# Patient Record
Sex: Female | Born: 1935 | Race: White | Hispanic: No | Smoking: Never smoker
Health system: Southern US, Community
[De-identification: ages and names within clinical notes are randomized; demographics above are authoritative.]

## PROBLEM LIST (undated history)

## (undated) DIAGNOSIS — M1990 Unspecified osteoarthritis, unspecified site: Secondary | ICD-10-CM

## (undated) DIAGNOSIS — F0390 Unspecified dementia without behavioral disturbance: Secondary | ICD-10-CM

## (undated) DIAGNOSIS — H3530 Unspecified macular degeneration: Secondary | ICD-10-CM

## (undated) DIAGNOSIS — K469 Unspecified abdominal hernia without obstruction or gangrene: Secondary | ICD-10-CM

## (undated) DIAGNOSIS — K219 Gastro-esophageal reflux disease without esophagitis: Secondary | ICD-10-CM

## (undated) DIAGNOSIS — I10 Essential (primary) hypertension: Secondary | ICD-10-CM

## (undated) DIAGNOSIS — D649 Anemia, unspecified: Secondary | ICD-10-CM

## (undated) DIAGNOSIS — E785 Hyperlipidemia, unspecified: Secondary | ICD-10-CM

## (undated) DIAGNOSIS — I5022 Chronic systolic (congestive) heart failure: Secondary | ICD-10-CM

## (undated) HISTORY — DX: Essential (primary) hypertension: I10

## (undated) HISTORY — PX: HYSTEROTOMY: SHX1776

## (undated) HISTORY — DX: Chronic systolic (congestive) heart failure: I50.22

## (undated) HISTORY — PX: ANGIOPLASTY: SHX39

## (undated) HISTORY — PX: APPENDECTOMY: SHX54

## (undated) HISTORY — DX: Hyperlipidemia, unspecified: E78.5

---

## 2009-09-12 ENCOUNTER — Observation Stay (HOSPITAL_COMMUNITY): Admission: EM | Admit: 2009-09-12 | Discharge: 2009-09-14 | Payer: Self-pay | Admitting: Emergency Medicine

## 2009-11-15 ENCOUNTER — Encounter: Admission: RE | Admit: 2009-11-15 | Discharge: 2009-11-15 | Payer: Self-pay | Admitting: Gastroenterology

## 2010-04-21 ENCOUNTER — Encounter: Payer: Self-pay | Admitting: Gastroenterology

## 2010-06-16 LAB — CBC
HCT: 32.8 % — ABNORMAL LOW (ref 36.0–46.0)
Hemoglobin: 11.2 g/dL — ABNORMAL LOW (ref 12.0–15.0)
Hemoglobin: 11.4 g/dL — ABNORMAL LOW (ref 12.0–15.0)
Hemoglobin: 12.2 g/dL (ref 12.0–15.0)
MCV: 90.6 fL (ref 78.0–100.0)
Platelets: 148 10*3/uL — ABNORMAL LOW (ref 150–400)
RBC: 3.58 MIL/uL — ABNORMAL LOW (ref 3.87–5.11)
RBC: 3.63 MIL/uL — ABNORMAL LOW (ref 3.87–5.11)
RBC: 3.96 MIL/uL (ref 3.87–5.11)
RDW: 13 % (ref 11.5–15.5)
RDW: 13.2 % (ref 11.5–15.5)
WBC: 5 10*3/uL (ref 4.0–10.5)

## 2010-06-16 LAB — CARDIAC PANEL(CRET KIN+CKTOT+MB+TROPI)
CK, MB: 1.6 ng/mL (ref 0.3–4.0)
Relative Index: INVALID (ref 0.0–2.5)
Relative Index: INVALID (ref 0.0–2.5)
Total CK: 41 U/L (ref 7–177)
Troponin I: 0.02 ng/mL (ref 0.00–0.06)
Troponin I: 0.03 ng/mL (ref 0.00–0.06)

## 2010-06-16 LAB — HEMOGLOBIN A1C
Mean Plasma Glucose: 140 mg/dL — ABNORMAL HIGH (ref <117)
Mean Plasma Glucose: 146 mg/dL — ABNORMAL HIGH (ref <117)

## 2010-06-16 LAB — COMPREHENSIVE METABOLIC PANEL
Alkaline Phosphatase: 55 U/L (ref 39–117)
BUN: 14 mg/dL (ref 6–23)
CO2: 25 mEq/L (ref 19–32)
Chloride: 110 mEq/L (ref 96–112)
Glucose, Bld: 94 mg/dL (ref 70–99)
Potassium: 4 mEq/L (ref 3.5–5.1)
Total Bilirubin: 0.5 mg/dL (ref 0.3–1.2)
Total Protein: 5.2 g/dL — ABNORMAL LOW (ref 6.0–8.3)

## 2010-06-16 LAB — POCT I-STAT 3, ART BLOOD GAS (G3+)
TCO2: 26 mmol/L (ref 0–100)
pH, Arterial: 7.381 (ref 7.350–7.400)

## 2010-06-16 LAB — DIFFERENTIAL
Eosinophils Absolute: 0 10*3/uL (ref 0.0–0.7)
Eosinophils Relative: 1 % (ref 0–5)
Lymphocytes Relative: 48 % — ABNORMAL HIGH (ref 12–46)
Monocytes Absolute: 0.3 10*3/uL (ref 0.1–1.0)
Monocytes Relative: 6 % (ref 3–12)
Neutro Abs: 2.5 10*3/uL (ref 1.7–7.7)
Neutrophils Relative %: 45 % (ref 43–77)

## 2010-06-16 LAB — T4, FREE: Free T4: 1.05 ng/dL (ref 0.80–1.80)

## 2010-06-16 LAB — POCT I-STAT, CHEM 8
Calcium, Ion: 1.15 mmol/L (ref 1.12–1.32)
Glucose, Bld: 117 mg/dL — ABNORMAL HIGH (ref 70–99)
HCT: 36 % (ref 36.0–46.0)

## 2010-06-16 LAB — GLUCOSE, CAPILLARY
Glucose-Capillary: 107 mg/dL — ABNORMAL HIGH (ref 70–99)
Glucose-Capillary: 125 mg/dL — ABNORMAL HIGH (ref 70–99)

## 2010-06-16 LAB — BASIC METABOLIC PANEL
Calcium: 8.3 mg/dL — ABNORMAL LOW (ref 8.4–10.5)
GFR calc Af Amer: >60 mL/min (ref >60)
GFR calc non Af Amer: >60 mL/min (ref >60)
Glucose, Bld: 115 mg/dL — ABNORMAL HIGH (ref 70–99)
Sodium: 141 mEq/L (ref 135–145)

## 2010-06-16 LAB — MAGNESIUM: Magnesium: 2 mg/dL (ref 1.5–2.5)

## 2010-06-16 LAB — POCT CARDIAC MARKERS: Myoglobin, poc: 100 ng/mL (ref 12–200)

## 2010-06-16 LAB — URINALYSIS, ROUTINE W REFLEX MICROSCOPIC
Bilirubin Urine: NEGATIVE
Nitrite: NEGATIVE

## 2010-06-16 LAB — CORTISOL: Cortisol, Plasma: 10.3 ug/dL

## 2010-06-16 LAB — CULTURE, BLOOD (ROUTINE X 2): Culture: NO GROWTH

## 2010-06-16 LAB — FERRITIN: Ferritin: 13 ng/mL (ref 10–291)

## 2010-06-16 LAB — BRAIN NATRIURETIC PEPTIDE: Pro B Natriuretic peptide (BNP): 131 pg/mL — ABNORMAL HIGH (ref 0.0–100.0)

## 2010-06-28 ENCOUNTER — Other Ambulatory Visit: Payer: Self-pay | Admitting: Gastroenterology

## 2010-06-28 DIAGNOSIS — K862 Cyst of pancreas: Secondary | ICD-10-CM

## 2010-07-08 ENCOUNTER — Ambulatory Visit
Admission: RE | Admit: 2010-07-08 | Discharge: 2010-07-08 | Disposition: A | Discharge status: Home / Self Care | Payer: Medicare PPO | Source: Ambulatory Visit | Attending: Gastroenterology | Admitting: Gastroenterology

## 2010-07-08 DIAGNOSIS — K862 Cyst of pancreas: Secondary | ICD-10-CM

## 2010-07-08 MED ORDER — GADOBENATE DIMEGLUMINE 529 MG/ML IV SOLN
9.0000 mL | Freq: Once | INTRAVENOUS | Status: AC | PRN
  Administered 2010-07-08: 9 mL via INTRAVENOUS

## 2010-07-12 NOTE — H&P (Signed)
Mary Cline, Mary Cline            ACCOUNT NO.:  [id number]  MEDICAL RECORD NO.:  [id number]          PATIENT TYPE:  OBV  LOCATION:  1846                         FACILITY:  [facility]  PHYSICIAN:  Valetta Close, M.D.   DATE OF BIRTH:  01/11/1923  DATE OF ADMISSION:  09/12/2009 DATE OF DISCHARGE:                             HISTORY & PHYSICAL  CHIEF COMPLAINT:  Weakness in the morning.  HISTORY OF PRESENT ILLNESS:  This is an 77 year old female with a history of mild CHF, hypertension, multiple allergies and anorexia who presents with 4 days in the setting of 1 month of generalized weakness. She has trouble walking in the morning.  She notes her symptoms improve as the day goes on to the point where she is doing fairly well by noon. She notes her gait is a shuffling gait and it is more shuffling than it used to be.  She feels like a weight is literally on her in the morning forcing her down.  She notes she gets lightheaded when she gets up quickly, especially in the morning.  This improves a little bit as the day goes on.  She notes no new medications.  She always weighs between 103 and 108 pounds and has had trouble eating in the past.  Though she has had bouts of diarrhea that have caused low blood pressure in the past she has not had any episodes since February.  She recently moved in with her family and she comes from [place].  She has been here for a month. Again, she moved because she was losing the ability to take care of herself.  She had a bowel movement every 2 days which she says is normal.  She says she always feels cold.  Her family has been checking her blood pressure off and on and her sugars in the morning off and on over the last few days.  Her sugars have been in the 90s but her blood pressure has been as low as the 70s and she did have an episode where she passed out while having a bowel movement recently.  PAST MEDICAL HISTORY: 1. CAD status post two angioplasties  with a history of MI and chronic     left bundle branch block. 2. Congestive heart failure, EF unknown but an echo is pending from     [place]. 3. Hypertension. 4. Status post cholecystectomy. 5. Status post appendectomy. 6. Poor pancreatic function for which she has been on Creon but she     has stopped that. 7. Hyperlipidemia. 8. History of intestinal torsion, being unable to tolerate a     colonoscopy. 9. She tells me that she had hypothyroidism for which she took     medications 20 years ago but she was put on too much of it causing     a bad reaction and she has been off of it ever since.  FAMILY HISTORY:  Notable for diabetes.  REVIEW OF SYSTEMS:  As per the HPI.  SOCIAL HISTORY:  She lives with her family.  No alcohol.  No tobacco. No drugs.  MEDICATIONS: 1. Lisinopril/HCTZ 20/12.5. 2. Protonix 40 mg by mouth  once a day. 3. Tramadol 50 mg by mouth one to two times a day as needed for pain. 4. Gemfibrozil 600 mg by mouth once a day.  She has an extensive allergy list including ACTONEL, VIOXX, ALL STATINS, MACROBID, DEMEROL, GARAMYCIN, IODINE, TETRACYCLINE, PENICILLIN, SULFA and PAXIL.  PHYSICAL EXAMINATION:  Temperature 98.2, heart rate 88, blood pressure 120/69.  On admission it went from 150 to 95 on orthostatics and she got symptomatically lightheaded.  Currently her blood pressure is 152/84 in front of me.  O2 sat 97% on room air. GENERAL: She is weak-appearing and in no apparent distress.  She may be a bit pale. LUNGS: Clear to auscultation bilaterally. CARDIAC: Regular rate and rhythm with a soft murmur. ABDOMEN: No tenderness, rebound or guarding. EXTREMITIES: No edema. NEURO EXAM: Globally weak, otherwise nonfocal.  Gait was not assessed because she is too weak to get up.  LABORATORY DATA:  ABG with a pH of 7.38, pCO2 42, pAO2 66, bicarb 25, O2 sat 92% on room air.  Lactic acid 1.3, BNP 131, hemoglobin 12.2, hematocrit 36 with an MCV of 90, white blood cell  count 5.5 with an ANC of 2.5 and lymphocytes of 2.6, platelets 148.  Sodium 130, potassium 4.4, chloride 104, bicarb 20, BUN 22, creatinine 1, glucose 117.  UA negative.  Portable chest x-ray negative except for an elevated left hemidiaphragm which she says is chronic.  Cardiac enzymes are negative x1.  ASSESSMENT AND PLAN: 1. Weakness.  I am going to admit for observation.  I am going to     hydrate seeing that she is orthostatic.  She is on lisinopril/HCT.     Will also check a TSH, a T4 and a free T3 because it is very     possible that hyper or hypothyroidism is causing all of her     symptoms.  I will also check an a.m. cortisol and if low, give a     cosyntropin stim test.  I will get a physical therapy evaluation,     check an ESR and a CRP to see if this is a myositis though it does     not really fit with that.  It also does not fit with arthritis     because it is diffuse.  I am also going to check a nocturnal O2     saturation just to see if she is very hypoxic at night, though I     doubt that is where the diagnosis lies. 2. Orthostatic hypotension, as above.  Will hydrate but with caution     given her congestive heart failure though I do not know her     ejection fraction and stop the ACE inhibitor HCTZ combo.  I am not     sure why she has not been put on a beta blocker with her congestive     heart failure.  I do not know what kind of congestive heart failure     she has and again will obtain those records from [place]. 3. Congestive heart failure, appears stable.  There is no sign of     volume overload.  No symptoms to support that so will just work up     as above. 4. Anorexia.  I will check a CMP and get a nutrition consult.  She has     been on Megace in the past and I think she may need to be on it in     the future.  She is quite weak and I think she would benefit from     nutritional supplements. 5. Hypoxia, as above. 6. Shuffling gait.  Will get a CT of the head  to rule out NPH and     again get the RPR, B12, RBC, folate, ferritin and have a physical     therapy consult.  Approximate time of this history and physical was 1515 to 1600.     Valetta Close, M.D.   JC/MEDQ  D:  09/12/2009  T:  09/12/2009  Job:  [id number]  Electronically Signed by Valetta Close M.D. on 07/12/2010 11:31:13 AM

## 2011-08-20 ENCOUNTER — Other Ambulatory Visit: Payer: Self-pay | Admitting: Gastroenterology

## 2011-08-20 DIAGNOSIS — K862 Cyst of pancreas: Secondary | ICD-10-CM

## 2011-09-10 ENCOUNTER — Ambulatory Visit
Admission: RE | Admit: 2011-09-10 | Discharge: 2011-09-10 | Disposition: A | Discharge status: Home / Self Care | Payer: Medicare PPO | Source: Ambulatory Visit | Attending: Gastroenterology | Admitting: Gastroenterology

## 2011-09-10 DIAGNOSIS — K862 Cyst of pancreas: Secondary | ICD-10-CM

## 2011-09-10 MED ORDER — GADOBENATE DIMEGLUMINE 529 MG/ML IV SOLN
8.0000 mL | Freq: Once | INTRAVENOUS | Status: AC | PRN
  Administered 2011-09-10: 8 mL via INTRAVENOUS

## 2011-09-16 ENCOUNTER — Encounter (HOSPITAL_COMMUNITY): Payer: Self-pay

## 2011-09-16 ENCOUNTER — Emergency Department (HOSPITAL_COMMUNITY)
Admission: EM | Admit: 2011-09-16 | Discharge: 2011-09-16 | Disposition: A | Discharge status: Home Health | Payer: Medicare PPO | Source: Home / Self Care | Attending: Emergency Medicine | Admitting: Emergency Medicine

## 2011-09-16 DIAGNOSIS — N390 Urinary tract infection, site not specified: Secondary | ICD-10-CM

## 2011-09-16 HISTORY — DX: Unspecified macular degeneration: H35.30

## 2011-09-16 HISTORY — DX: Gastro-esophageal reflux disease without esophagitis: K21.9

## 2011-09-16 HISTORY — DX: Unspecified abdominal hernia without obstruction or gangrene: K46.9

## 2011-09-16 HISTORY — DX: Unspecified osteoarthritis, unspecified site: M19.90

## 2011-09-16 HISTORY — DX: Essential (primary) hypertension: I10

## 2011-09-16 HISTORY — DX: Anemia, unspecified: D64.9

## 2011-09-16 LAB — POCT URINALYSIS DIP (DEVICE)
Glucose, UA: NEGATIVE mg/dL
Ketones, ur: NEGATIVE mg/dL
Protein, ur: 30 mg/dL — AB

## 2011-09-16 LAB — POCT I-STAT, CHEM 8
Calcium, Ion: 1.19 mmol/L (ref 1.12–1.32)
Creatinine, Ser: 0.8 mg/dL (ref 0.50–1.10)
Hemoglobin: 13.6 g/dL (ref 12.0–15.0)
Sodium: 138 mEq/L (ref 135–145)
TCO2: 24 mmol/L (ref 0–100)

## 2011-09-16 MED ORDER — ONDANSETRON 8 MG PO TBDP: 8.0000 mg | ORAL_TABLET | Freq: Three times a day (TID) | ORAL | Status: AC | PRN

## 2011-09-16 NOTE — Discharge Instructions (Signed)

## 2011-09-16 NOTE — ED Notes (Signed)
Pt c/o UTI and abdominal pain.  Pt states she was DX with UTI and started ABX on Thursday. Pt started Cipro, unable to tolerate, changed to Macrobid still experiencing nausea.  Pt was not given any anti-emetic Rx.

## 2011-09-16 NOTE — ED Provider Notes (Signed)
Chief Complaint  Patient presents with  . Urinary Tract Infection    History of Present Illness:   The patient is an 78 year old female who presents with urinary tract infection. One week ago she went to her primary care physician, Dr. Jillyn Hidden for blood pressure check. She had a urinalysis done which apparently showed evidence of urinary tract infection. She was not having much in the way of symptoms except for some urinary frequency. 2 days later, she was started on Cipro. She took 3 doses of this, but it caused burning of the stomach in chest, so she stopped. She called back the next day and the doctor switched her to Macrobid. She took 3 doses of this and cause some nausea. She stopped this as well. She hasn't taken any antibiotics since then and ever since has had dry mouth, generalized weakness, unsteadiness, poor appetite, confusion, low blood sugar episodes, abdominal pain, nausea, vomiting, and constipation. She has not had any fever or chills. Her glucose today was 161. She denies dysuria, urgency, or hematuria.  Review of Systems:  Other than noted above, the patient denies any of the following symptoms: General:  No fevers, chills, sweats, aches, or fatigue. GI:  No abdominal pain, back pain, nausea, vomiting, diarrhea, or constipation. GU:  No dysuria, frequency, urgency, hematuria, or incontinence. GYN:  No discharge, itching, vulvar pain or lesions, pelvic pain, or abnormal vaginal bleeding.  PMFSH:  Past medical history, family history, social history, meds, and allergies were reviewed.  Physical Exam:   Vital signs:  BP 144/77  Pulse 108  Temp 98.7 F (37.1 C) (Oral)  Resp 22  SpO2 97% Gen:  Alert, oriented, in no distress. Lungs:  Clear to auscultation, no wheezes, rales or rhonchi. Heart:  Regular rhythm, no gallop or murmer. Abdomen:  Flat and soft. There was slight suprapubic pain to palpation.  No guarding, or rebound.  No hepato-splenomegaly or mass.  Bowel sounds were  normally active.  No hernia. Back:  No CVA tenderness.  Skin:  Clear, warm and dry.  Labs:   Results for orders placed during the hospital encounter of 09/16/11  POCT I-STAT, CHEM 8      Component Value Range   Sodium 138  135 - 145 mEq/L   Potassium 4.1  3.5 - 5.1 mEq/L   Chloride 100  96 - 112 mEq/L   BUN 19  6 - 23 mg/dL   Creatinine, Ser 0.86  0.50 - 1.10 mg/dL   Glucose, Bld 578 (*) 70 - 99 mg/dL   Calcium, Ion 4.69  6.29 - 1.32 mmol/L   TCO2 24  0 - 100 mmol/L   Hemoglobin 13.6  12.0 - 15.0 g/dL   HCT 52.8  41.3 - 24.4 %  POCT URINALYSIS DIP (DEVICE)      Component Value Range   Glucose, UA NEGATIVE  NEGATIVE mg/dL   Bilirubin Urine NEGATIVE  NEGATIVE   Ketones, ur NEGATIVE  NEGATIVE mg/dL   Specific Gravity, Urine 1.010  1.005 - 1.030   Hgb urine dipstick LARGE (*) NEGATIVE   pH 6.5  5.0 - 8.0   Protein, ur 30 (*) NEGATIVE mg/dL   Urobilinogen, UA 0.2  0.0 - 1.0 mg/dL   Nitrite NEGATIVE  NEGATIVE   Leukocytes, UA TRACE (*) NEGATIVE     Assessment: The encounter diagnosis was UTI (lower urinary tract infection).  She appears to have a urinary tract infection but is having difficulty tolerating antibiotics. She cannot take sulfa or penicillin this.  I would like to try a half a dose of the Cipro twice daily along with some Zofran. She was encouraged to try to get as much clear liquids if she can't eat only light foods such as bananas, rice, applesauce, and toast. She is to followup with Dr. Jillyn Hidden next week. I told her daughter she should develop any fever, chills, or Xylocaine medications or liquids on her stomach to go directly to the hospital.  Plan:   1.  The following meds were prescribed:   New Prescriptions   ONDANSETRON (ZOFRAN ODT) 8 MG DISINTEGRATING TABLET    Take 1 tablet (8 mg total) by mouth every 8 (eight) hours as needed for nausea.   ONDANSETRON (ZOFRAN ODT) 8 MG DISINTEGRATING TABLET    Take 1 tablet (8 mg total) by mouth every 8 (eight) hours as needed for  nausea.   2.  The patient was instructed in symptomatic care and handouts were given. 3.  The patient was told to return if becoming worse in any way, if no better in 3 or 4 days, and given some red flag symptoms that would indicate earlier return. 4.  The patient was told to avoid intercourse for 10 days, get extra fluids, and return for a follow up with her primary care doctor at the completion of treatment for a repeat UA and culture.     Reuben Likes, MD 09/16/11 2012

## 2011-09-18 LAB — URINE CULTURE: Special Requests: NORMAL

## 2012-06-14 ENCOUNTER — Other Ambulatory Visit (HOSPITAL_COMMUNITY): Payer: Self-pay | Admitting: Gastroenterology

## 2012-06-14 DIAGNOSIS — R1310 Dysphagia, unspecified: Secondary | ICD-10-CM

## 2012-06-21 ENCOUNTER — Ambulatory Visit (HOSPITAL_COMMUNITY)
Admission: RE | Admit: 2012-06-21 | Discharge: 2012-06-21 | Disposition: A | Discharge status: Home / Self Care | Payer: Medicare PPO | Source: Ambulatory Visit | Attending: Gastroenterology | Admitting: Gastroenterology

## 2012-06-21 ENCOUNTER — Ambulatory Visit (HOSPITAL_COMMUNITY): Payer: Medicare PPO

## 2012-06-21 DIAGNOSIS — R1310 Dysphagia, unspecified: Secondary | ICD-10-CM

## 2012-06-21 DIAGNOSIS — K224 Dyskinesia of esophagus: Secondary | ICD-10-CM | POA: Insufficient documentation

## 2012-06-21 DIAGNOSIS — I10 Essential (primary) hypertension: Secondary | ICD-10-CM | POA: Insufficient documentation

## 2012-06-21 DIAGNOSIS — K219 Gastro-esophageal reflux disease without esophagitis: Secondary | ICD-10-CM | POA: Insufficient documentation

## 2012-06-21 DIAGNOSIS — E119 Type 2 diabetes mellitus without complications: Secondary | ICD-10-CM | POA: Insufficient documentation

## 2012-06-21 NOTE — Procedures (Signed)
Objective Swallowing Evaluation: Modified Barium Swallowing Study  Patient Details  Name: Mary Cline MRN: [id number] Date of Birth: Jan 06, 1923  Today's Date: 06/21/2012 Time: 4098-1191 SLP Time Calculation (min): 35 min  Past Medical History:  Past Medical History  Diagnosis Date  . Hypertension   . GERD (gastroesophageal reflux disease)   . Diabetes mellitus   . Arthritis   . Macular degeneration disease   . Anemia   . Hernia    Past Surgical History:  Past Surgical History  Procedure Laterality Date  . Angioplasty     HPI:  79 yr old seen for outpatient MBS accompanied by her son-in-law with complaints of coughing while eating/drinking during meals, globus feeling in pharynx and vocal disturbances (low vocal intensity, hoarse).  PMH:  hiatal hernia (takes medicaiton), anemia.     Assessment / Plan / Recommendation Clinical Impression  Dysphagia Diagnosis: Suspected primary esophageal dysphagia;Mild cervical esophageal phase dysphagia Clinical impression: Pt. exhibited mild cervical esophageal dysphagia and suspected primary esophageal dysphagia.  Pharyngeal phase was grossly within functional limits.  Slow clearance of barium through cervical esophagus with what appeared to be bony growths (osteophytes?) in lower cervical esophagus with decreased pressue to transit through proximal esophagus.  Esophagus briefly scanned revealing what appeared to be esophageal residue and slow transit to LES.  One episode of laryngeal penetration with thin barium that briefly entered laryngeal vestibule and ejected out during the swallow likely resulting from inadequate esophageal pressure.  A barium esophagram was performed with radiologist following MBS which revealed nonspecific esophageal motility disorder with severe tertiary.  Pt.'s vocal hoarseness may be result of LPR (laryngopharyngeal reflux?).  She very concerned with current dysphonia as singing is an integral part of her life.  SLP  recommended she see an ENT of problems persis.  SLP reviewed esophageal precautions.     Treatment Recommendation  No treatment recommended at this time    Diet Recommendation Regular;Thin liquid   Liquid Administration via: Cup;Straw Medication Administration: Whole meds with liquid Supervision: Patient able to self feed Compensations: Slow rate;Small sips/bites Postural Changes and/or Swallow Maneuvers: Seated upright 90 degrees;Upright 30-60 min after meal    Other  Recommendations Recommended Consults: Consider ENT evaluation Oral Care Recommendations: Oral care BID   Follow Up Recommendations  None    Frequency and Duration        Pertinent Vitals/Pain none        Reason for Referral Objectively evaluate swallowing function   Oral Phase Oral Preparation/Oral Phase Oral Phase: WFL   Pharyngeal Phase Pharyngeal Phase Pharyngeal Phase: Impaired Pharyngeal - Thin Pharyngeal - Thin Cup: Penetration/Aspiration during swallow;Reduced airway/laryngeal closure;Pharyngeal residue - valleculae;Pharyngeal residue - pyriform sinuses (trace residue only) Penetration/Aspiration details (thin cup): Material enters airway, remains ABOVE vocal cords then ejected out  Cervical Esophageal Phase        Cervical Esophageal Phase Cervical Esophageal Phase: Impaired Cervical Esophageal Phase - Comment Cervical Esophageal Comment:  (slow transit through cervical esophagus with min stasis)         Breck Coons Lonell Face.Ed [employer] [phone]  06/21/2012

## 2012-11-05 ENCOUNTER — Encounter: Payer: Self-pay | Admitting: Neurology

## 2012-11-08 ENCOUNTER — Encounter: Payer: Self-pay | Admitting: Neurology

## 2012-11-08 ENCOUNTER — Ambulatory Visit (INDEPENDENT_AMBULATORY_CARE_PROVIDER_SITE_OTHER): Payer: Medicare PPO | Admitting: Neurology

## 2012-11-08 VITALS — BP 151/89 | HR 92 | Ht 61.5 in | Wt 100.2 lb

## 2012-11-08 DIAGNOSIS — H539 Unspecified visual disturbance: Secondary | ICD-10-CM | POA: Insufficient documentation

## 2012-11-08 DIAGNOSIS — R519 Headache, unspecified: Secondary | ICD-10-CM | POA: Insufficient documentation

## 2012-11-08 DIAGNOSIS — R51 Headache: Secondary | ICD-10-CM | POA: Insufficient documentation

## 2012-11-08 DIAGNOSIS — H3400 Transient retinal artery occlusion, unspecified eye: Secondary | ICD-10-CM

## 2012-11-08 DIAGNOSIS — G453 Amaurosis fugax: Secondary | ICD-10-CM

## 2012-11-08 NOTE — Patient Instructions (Addendum)
Overall you are doing fairly well but I do want to suggest a few things today:   Remember to drink plenty of fluid, eat healthy meals and do not skip any meals. Try to eat protein with a every meal and eat a healthy snack such as fruit or nuts in between meals. Try to keep a regular sleep-wake schedule and try to exercise daily, particularly in the form of walking, 20-30 minutes a day, if you can.   Your symptoms are concerning for a few different diagnosis that I would like to work up.   One possibility is a condition called Giant Cell arteritis. In order to check for this I am ordering a blood test called a ESR (sedimentation rate).  To work up whether this may have been a stroke or TIA (transient ischemic attack) I would like to order a carotid ultrasound  I suggest you follow up with your eye doctor to rule conditions such as glaucoma or macular degeneration   I would like to see you back in 2 to 3 months, sooner if we need to. Please call us with any interim questions, concerns, problems, updates or refill requests.   Please also call us for any test results so we can go over those with you on the phone.  My clinical assistant and will answer any of your questions and relay your messages to me and also relay most of my messages to you.   Our phone number is [phone]. We also have an after hours call service for urgent matters and there is a physician on-call for urgent questions. For any emergencies you know to call 911 or go to the nearest emergency room

## 2012-11-08 NOTE — Progress Notes (Signed)
[facility]  Provider:  Dr Hosie Poisson Referring Provider: Cain Saupe, MD Primary Care Physician:  Cain Saupe, MD  CC:  Dizziness and visual changes  HPI:  Mary Cline is a 79 y.o. female here as a referral from Dr. Jillyn Hidden for evaluation of headache and visual changes.  Mary Cline is a pleasant 79 year old woman who presents for initial evaluation of headache and right eye visual changes. She notes acute onset of sharp retro-orbital right eye pain, lasting around 10-20 minutes. They came on quickly and was described as a blurring haziness of her vision, she denies any current or sheet coming down. More of a fogginess. With this episode she also noted pain in the right temporal region. She has some tenderness to palpation of that region. With these symptoms denies any ptosis, weakness of extremities weakness of face any sensory changes of face or body. Improves slowly with time. She denies any pain  or  difficulty chewing. no other symptoms at that time. She has noted increased eye fatigue since this event, though no further episodes of vision change. Has not seen an eye doctor since around Jan 2014. Has history of detached retina in right eye and macular degeneration in L eye for which she receives laser treatment.   Very sharp pain around R eye, lasted 15 to 20 minutes, no ptosis or drooping noted, wasn't clear, was not a curtain coming down, whole side of R side of head hurt, very tender right side. Came on quickly. Retroorbintal pain, next morning noted erythema in periorbital area. No prior hx. No other symptoms. Got slowly better over time. No pain/difficulty chewing.  Has hx of DM, HTN, hyperlipidemia. Has had CAD in the past, no prior stroke history.   Review of Systems: Out of a complete 14 system review, the patient complains of only the following symptoms, and all other reviewed systems are negative. + blurred vision, eye pain, anemia, hearing loss, trouble  swallowing, constipation, aching muscles, memory loss   History   Social History  . Marital Status: Widowed    Spouse Name: N/A    Number of Children: N/A  . Years of Education: N/A   Occupational History  . Not on file.   Social History Main Topics  . Smoking status: Never Smoker   . Smokeless tobacco: Not on file  . Alcohol Use: No  . Drug Use: No  . Sexually Active:    Other Topics Concern  . Not on file   Social History Narrative  . No narrative on file    No family history on file.  Past Medical History  Diagnosis Date  . Hypertension   . GERD (gastroesophageal reflux disease)   . Diabetes mellitus   . Arthritis   . Macular degeneration disease   . Anemia   . Hernia     Past Surgical History  Procedure Laterality Date  . Angioplasty      Current Outpatient Prescriptions  Medication Sig Dispense Refill  . Cyanocobalamin (VITAMIN B 12 PO) Place 500 mcg under the tongue every other day.      . Diclofenac Sodium (PENNSAID) 1.5 % SOLN Place onto the skin.      Marland Kitchen Fluconazole (DIFLUCAN PO) Take 100 mg by mouth daily.      Marland Kitchen lisinopril (PRINIVIL,ZESTRIL) 5 MG tablet Take 5 mg by mouth daily.      . metFORMIN (GLUCOPHAGE) 500 MG tablet Take 250 mg by mouth daily with breakfast.      .  Nystatin 1000000 UNITS CAPS Apply 100,000 Units topically. Apply to affected area bid      . omeprazole (PRILOSEC) 40 MG capsule Take 40 mg by mouth daily.      . traMADol (ULTRAM) 50 MG tablet Take 50 mg by mouth every 6 (six) hours as needed.       No current facility-administered medications for this visit.    Allergies as of 11/08/2012 - Review Complete 06/21/2012  Allergen Reaction Noted  . Iodine Anaphylaxis 09/16/2011  . Acth (corticotropin) Other (See Comments) 09/16/2011  . Codeine Other (See Comments) 09/16/2011  . Demerol (meperidine) Other (See Comments) 09/16/2011  . Sulfa antibiotics Swelling 09/16/2011  . Vioxx (rofecoxib)  09/16/2011  . Garamycin  (gentamicin sulfate) Rash 09/16/2011    Vitals: There were no vitals taken for this visit. Last Weight:  Wt Readings from Last 1 Encounters:  No data found for Wt   Last Height:   Ht Readings from Last 1 Encounters:  No data found for Ht     Physical exam: Exam: Gen: NAD, conversant Eyes: anicteric sclerae, moist conjunctivae HENT: Atraumatic, tenderness to palpation R temporal region Neck: Trachea midline; supple,  Lungs: CTA, no wheezing, rales, rhonic                          CV: RRR, no MRG Abdomen: Soft, non-tender;  Extremities: No peripheral edema  Skin: Normal temperature, no rash,  Psych: Appropriate affect, pleasant  Neuro: Mary: AA&Ox3, appropriately interactive, normal affect   Speech: fluent w/o paraphasic error  Memory: good recent and remote recall  CN: Mild miosis right pupil, difficult to visualize fundus bilat, EOMI no nystagmus no pain with eye movements, VFF to FC bilat, no ptosis, sensation intact to LT V1-V3 bilat, face symmetric, no weakness, hearing grossly intact, palate elevates symmetrically, shoulder shrug 5/5 bilat,  tongue protrudes midline, no fasiculations noted.  Motor: normal bulk and tone, noted decreased ROM bilateral shoulder (she notes is chronic problem) Strength: 5/5  In all extremities  Coord: rapid alternating and point-to-point (FNF, HTS) movements intact.  Reflexes: symmetrical, bilat downgoing toes  Sens: LT intact in all extremities  Gait: posture, stance, stride and arm-swing normal. Tandem gait intact. Able to walk on heels and toes. Romberg absent.   Assessment:  After physical and neurologic examination, review of laboratory studies, imaging, neurophysiology testing and pre-existing records, assessment will be reviewed on the problem list.  Plan:  Treatment plan and additional workup will be reviewed under Problem List.  Mary Cline is a pleasant 79y/o woman who presents for initial evaluation of transient eye  pain and headache located in R temporal region. Based on clinical history and physical exam findings the differential includes Giant Cell arteritis vs TIA/Amaurosis Fugax vs possible eye related problem such as glaucoma  1) Headache 2) visual changes  -will order ESR to check for GCA, if positive will consider temporal artery biopsy and/or starting steroids -will check carotid ultrasound. Will hold off starting anti-platelet therapy pending results as patient reports prior adverse effect with use of ASA -recommended to patient she follow up with eye doctor for formal eye exam   -follow up in 2 to 3 months

## 2012-11-09 ENCOUNTER — Telehealth: Payer: Self-pay

## 2012-11-09 LAB — SEDIMENTATION RATE: Sed Rate: 7 mm/hr (ref 0–40)

## 2012-11-09 NOTE — Telephone Encounter (Signed)
Message copied by [facility] on Tue Nov 09, 2012  1:37 PM ------      Message from: Ramond Marrow      Created: Tue Nov 09, 2012  1:09 PM       Please let Ms. Rotter know the ESR was normal. Thanks. ------

## 2012-11-09 NOTE — Telephone Encounter (Signed)
I called and spoke with patient's daughter, Mary Cline. I let her know the ESR was normal. She asked if that was the test for arthritis. I explained that this test looks for inflammatory diseases in the body that can include rheumatoid arthritis, lupus and the like. She thanked me for the information.

## 2012-11-10 ENCOUNTER — Encounter: Payer: Self-pay | Admitting: Neurology

## 2012-11-10 DIAGNOSIS — M316 Other giant cell arteritis: Secondary | ICD-10-CM | POA: Insufficient documentation

## 2012-11-10 DIAGNOSIS — H349 Unspecified retinal vascular occlusion: Secondary | ICD-10-CM | POA: Insufficient documentation

## 2012-11-23 ENCOUNTER — Ambulatory Visit (INDEPENDENT_AMBULATORY_CARE_PROVIDER_SITE_OTHER): Payer: Medicare PPO

## 2012-11-23 DIAGNOSIS — G453 Amaurosis fugax: Secondary | ICD-10-CM

## 2012-11-23 DIAGNOSIS — H5310 Unspecified subjective visual disturbances: Secondary | ICD-10-CM

## 2012-11-23 DIAGNOSIS — R51 Headache: Secondary | ICD-10-CM

## 2013-02-28 ENCOUNTER — Ambulatory Visit: Payer: Medicare PPO | Admitting: Podiatry

## 2013-10-24 ENCOUNTER — Other Ambulatory Visit: Payer: Self-pay | Admitting: Gastroenterology

## 2013-10-24 DIAGNOSIS — R1310 Dysphagia, unspecified: Secondary | ICD-10-CM

## 2013-10-24 DIAGNOSIS — R1112 Projectile vomiting: Secondary | ICD-10-CM

## 2013-10-31 ENCOUNTER — Ambulatory Visit
Admission: RE | Admit: 2013-10-31 | Discharge: 2013-10-31 | Disposition: A | Discharge status: Home / Self Care | Payer: Medicare PPO | Source: Ambulatory Visit | Attending: Gastroenterology | Admitting: Gastroenterology

## 2013-10-31 DIAGNOSIS — R1112 Projectile vomiting: Secondary | ICD-10-CM

## 2013-10-31 DIAGNOSIS — R1310 Dysphagia, unspecified: Secondary | ICD-10-CM

## 2014-02-13 ENCOUNTER — Ambulatory Visit (INDEPENDENT_AMBULATORY_CARE_PROVIDER_SITE_OTHER): Payer: Medicare PPO | Admitting: Podiatry

## 2014-02-13 DIAGNOSIS — M79673 Pain in unspecified foot: Secondary | ICD-10-CM

## 2014-02-13 DIAGNOSIS — B351 Tinea unguium: Secondary | ICD-10-CM

## 2014-02-14 NOTE — Progress Notes (Signed)
Subjective:     Patient ID: Mary Cline, female   DOB: 03/09/1923, 80 y.o.   MRN: [id number]  HPIpatient presents with nail disease 1-5 both feet that are painful and she cannot cut herself   Review of Systems     Objective:   Physical Exam Neurovascular status unchanged with thick yellow brittle nailbeds 1-5 both feet that are painful    Assessment:     Mycotic nail infection with pain 1-5 both feet    Plan:     Debride painful nailbeds 1-5 both feet with no iatrogenic bleeding noted

## 2014-09-04 ENCOUNTER — Emergency Department (HOSPITAL_COMMUNITY): Payer: Medicare PPO

## 2014-09-04 ENCOUNTER — Emergency Department (HOSPITAL_COMMUNITY)
Admission: EM | Admit: 2014-09-04 | Discharge: 2014-09-04 | Disposition: A | Discharge status: Home / Self Care | Payer: Medicare PPO | Attending: Emergency Medicine | Admitting: Emergency Medicine

## 2014-09-04 ENCOUNTER — Encounter (HOSPITAL_COMMUNITY): Payer: Self-pay | Admitting: Emergency Medicine

## 2014-09-04 DIAGNOSIS — K219 Gastro-esophageal reflux disease without esophagitis: Secondary | ICD-10-CM | POA: Diagnosis not present

## 2014-09-04 DIAGNOSIS — S79911A Unspecified injury of right hip, initial encounter: Secondary | ICD-10-CM | POA: Diagnosis not present

## 2014-09-04 DIAGNOSIS — Z79899 Other long term (current) drug therapy: Secondary | ICD-10-CM | POA: Diagnosis not present

## 2014-09-04 DIAGNOSIS — S299XXA Unspecified injury of thorax, initial encounter: Secondary | ICD-10-CM | POA: Diagnosis present

## 2014-09-04 DIAGNOSIS — Y998 Other external cause status: Secondary | ICD-10-CM | POA: Diagnosis not present

## 2014-09-04 DIAGNOSIS — W1839XA Other fall on same level, initial encounter: Secondary | ICD-10-CM | POA: Diagnosis not present

## 2014-09-04 DIAGNOSIS — E119 Type 2 diabetes mellitus without complications: Secondary | ICD-10-CM | POA: Insufficient documentation

## 2014-09-04 DIAGNOSIS — Z880 Allergy status to penicillin: Secondary | ICD-10-CM | POA: Insufficient documentation

## 2014-09-04 DIAGNOSIS — Z8739 Personal history of other diseases of the musculoskeletal system and connective tissue: Secondary | ICD-10-CM | POA: Insufficient documentation

## 2014-09-04 DIAGNOSIS — Y9289 Other specified places as the place of occurrence of the external cause: Secondary | ICD-10-CM | POA: Insufficient documentation

## 2014-09-04 DIAGNOSIS — Z8669 Personal history of other diseases of the nervous system and sense organs: Secondary | ICD-10-CM | POA: Insufficient documentation

## 2014-09-04 DIAGNOSIS — W19XXXA Unspecified fall, initial encounter: Secondary | ICD-10-CM

## 2014-09-04 DIAGNOSIS — I10 Essential (primary) hypertension: Secondary | ICD-10-CM | POA: Insufficient documentation

## 2014-09-04 DIAGNOSIS — S2242XA Multiple fractures of ribs, left side, initial encounter for closed fracture: Secondary | ICD-10-CM | POA: Insufficient documentation

## 2014-09-04 DIAGNOSIS — Y9389 Activity, other specified: Secondary | ICD-10-CM | POA: Insufficient documentation

## 2014-09-04 DIAGNOSIS — Z9861 Coronary angioplasty status: Secondary | ICD-10-CM | POA: Insufficient documentation

## 2014-09-04 DIAGNOSIS — D649 Anemia, unspecified: Secondary | ICD-10-CM | POA: Insufficient documentation

## 2014-09-04 MED ORDER — FENTANYL 12 MCG/HR TD PT72
12.5000 ug | MEDICATED_PATCH | TRANSDERMAL | Status: DC
  Administered 2014-09-04: 12.5 ug via TRANSDERMAL
  Filled 2014-09-04: qty 1

## 2014-09-04 MED ORDER — TRAMADOL HCL 50 MG PO TABS: 50.0000 mg | ORAL_TABLET | Freq: Three times a day (TID) | ORAL | Status: AC | PRN

## 2014-09-04 MED ORDER — FENTANYL CITRATE (PF) 100 MCG/2ML IJ SOLN
25.0000 ug | Freq: Once | INTRAMUSCULAR | Status: AC
  Administered 2014-09-04: 25 ug via INTRAMUSCULAR
  Filled 2014-09-04: qty 2

## 2014-09-04 NOTE — Discharge Instructions (Signed)
As discussed, with your newly broken ribs, it is important that you monitor your condition carefully, and do not hesitate to return here for concerning changes in your condition.  If he develops new, or concerning changes, return here immediately.  Otherwise, please sure to follow-up with her primary care physician.  The next 3 days, please use the provided incentive spirometer every 4 hours.  After your pain medication patch has expired, in 3 days, please use the prescribed medication for pain relief.

## 2014-09-04 NOTE — ED Notes (Signed)
Pt c/o left sided rib pain, worse on inspiration onset last night after she tripped and fell. Pt denies LOC, head injury, or use of anticoagulants.

## 2014-09-04 NOTE — ED Provider Notes (Signed)
CSN: [id number]     Arrival date & time 09/04/14  1801 History   First MD Initiated Contact with Patient 09/04/14 1957     Chief Complaint  Patient presents with  . Fall  . Rib Pain     HPI  Patient presents after a fall. Patient mechanical fall the hours prior to ED arrival. She slipped, fell onto her left side, falling onto a pile of wood. Since that time she has had severe pain focally in the left axilla. Pain is nonradiating, worse with activity and inspiration. There is mild associated dyspnea. No lightheadedness, head trauma, syncope. Patient has multiple superficial abrasions, but no other particularly tender area.   Past Medical History  Diagnosis Date  . Hypertension   . GERD (gastroesophageal reflux disease)   . Diabetes mellitus   . Arthritis   . Macular degeneration disease   . Anemia   . Hernia    Past Surgical History  Procedure Laterality Date  . Angioplasty    . Hysterotomy    . Appendectomy     Family History  Problem Relation Age of Onset  . Diabetes     History  Substance Use Topics  . Smoking status: Never Smoker   . Smokeless tobacco: Never Used  . Alcohol Use: No   OB History    No data available     Review of Systems  Constitutional:       Per HPI, otherwise negative  HENT:       Per HPI, otherwise negative  Respiratory:       Per HPI, otherwise negative  Cardiovascular:       Per HPI, otherwise negative  Gastrointestinal: Negative for vomiting.  Endocrine:       Negative aside from HPI  Genitourinary:       Neg aside from HPI   Musculoskeletal:       Per HPI, otherwise negative  Skin: Negative.   Neurological: Negative for syncope.      Allergies  Fish allergy; Iodine; Acth; Atorvastatin; Codeine; Demerol; Lactose intolerance (gi); Other; Penicillins; Sulfa antibiotics; Vioxx; and Garamycin  Home Medications   Prior to Admission medications   Medication Sig Start Date End Date Taking? Authorizing Provider    cholecalciferol (VITAMIN D) 1000 UNITS tablet Take 1,000 Units by mouth daily.   Yes Historical Provider, MD  Cyanocobalamin (VITAMIN B 12 PO) Place 500 mcg under the tongue every other day.   Yes Historical Provider, MD  glycerin adult 2 G SUPP Place 1 suppository rectally once as needed for moderate constipation.   Yes Historical Provider, MD  lisinopril (PRINIVIL,ZESTRIL) 5 MG tablet Take 5 mg by mouth daily.   Yes Historical Provider, MD  NONFORMULARY OR COMPOUNDED ITEM Ketoconazole and fluticasone compounded cream.  Apply as needed to affected areas as needed as directed.   Yes Historical Provider, MD  omeprazole (PRILOSEC) 40 MG capsule Take 40 mg by mouth daily.   Yes Historical Provider, MD  traMADol (ULTRAM) 50 MG tablet Take 50 mg by mouth every 6 (six) hours as needed.   Yes Historical Provider, MD   BP 146/77 mmHg  Pulse 95  Temp(Src) 98.6 F (37 C) (Oral)  Resp 22  SpO2 95% Physical Exam  Constitutional: She is oriented to person, place, and time. She appears well-developed and well-nourished. No distress.  HENT:  Head: Normocephalic and atraumatic.  Eyes: Conjunctivae and EOM are normal.  Cardiovascular: Normal rate and regular rhythm.   Pulmonary/Chest: Effort normal and breath  sounds normal. No stridor. No respiratory distress.    Abdominal: She exhibits no distension.  Musculoskeletal: She exhibits no edema.       Arms: Neurological: She is alert and oriented to person, place, and time. No cranial nerve deficit.  Skin: Skin is warm and dry.  Psychiatric: She has a normal mood and affect.  Nursing note and vitals reviewed.   ED Course  Procedures (including critical care time) Labs Review Labs Reviewed - No data to display  Imaging Review Dg Chest 2 View  09/04/2014   CLINICAL DATA:  Left-sided chest pain which increases with inspiration, increased after recent fall yesterday, initial encounter  EXAM: CHEST - 2 VIEW  COMPARISON:  03/04/2010  FINDINGS:  Significant elevation of left hemidiaphragm is noted. This is stable from the previous exam. The cardiac shadow remains enlarged. The thoracic aorta is tortuous and demonstrates diffuse calcifications. No focal infiltrate or sizable effusion is seen. No acute bony abnormality is noted.  IMPRESSION: Stable appearance of the chest.  No acute abnormality noted.   Electronically Signed   By: Inez Catalina M.D.   On: 09/04/2014 19:25     EKG Interpretation   Date/Time:  Monday September 04 2014 18:50:07 EDT Ventricular Rate:  97 PR Interval:  106 QRS Duration: 155 QT Interval:  414 QTC Calculation: 526 R Axis:   101 Text Interpretation:  Sinus or ectopic atrial rhythm Short PR interval  Consider left ventricular hypertrophy Repol abnrm, global ischemia,  diffuse leads Prolonged QT interval Baseline wander in lead(s) I II aVR  aVL V1 V4 QT prolonged Abnormal ekg Sinus rhythm Non-specific  intra-ventricular conduction delay Left ventricular hypertrophy Confirmed  by Carmin Muskrat  MD ([id number]) on 09/04/2014 6:53:36 PM     Pulse ox 97% room air normal  After the initial evaluation, I reviewed the patient's x-ray. With concern for occult fracture, CT scan was ordered.  On repeat exam the patient has had no ongoing chest pain. She, her daughter and I had a lengthy conversation about results of the CT scan, notable for 3 fractures. Specifically, we discussed return precautions, follow-up instructions, the need for incentive spirometer.  MDM   Final diagnoses:  Fall, initial encounter  Fracture of ribs, three, closed, left, initial encounter   Patient presents after mechanical fall with pain in the left rib cage. Here patient is awake, alert, afebrile, with resolution of her pain. Patient does not tolerate oral narcotics, or analgesia well, and she did receive transdermal fentanyl patch, at the lowest possible dose. This occurred after a lengthy conversation with her and her daughter about risks  and benefits of using this medication. Patient will follow-up with primary care.  Carmin Muskrat, MD 09/04/14 [phone]

## 2014-09-04 NOTE — Progress Notes (Signed)
CSW met with pt at bedside. Daughter was present. Daughter states that the pt is hard of hearing.Patient confirms that she presents to [facility] due to fall. Daughter stated that the pt was walking through the parking lot and slipped and fell sideways.  Patient states that she lived at home in [place] with her daughter. Patient informed CSW that she can complete her ADL's independently. She states that she does not fall often.  Patient and daughter state that they are not interested in facility at this time. Patient stated during the assessment " When I breathe real hard my ribs hurt."  Patient and daughter state that they do not have any questions at this time.  Willette Brace [phone] ED CSW 09/04/2014 10:25 PM

## 2014-09-05 ENCOUNTER — Encounter: Payer: Self-pay | Admitting: Podiatry

## 2014-09-05 ENCOUNTER — Ambulatory Visit (INDEPENDENT_AMBULATORY_CARE_PROVIDER_SITE_OTHER): Payer: Medicare PPO | Admitting: Podiatry

## 2014-09-05 DIAGNOSIS — B351 Tinea unguium: Secondary | ICD-10-CM | POA: Diagnosis not present

## 2014-09-05 DIAGNOSIS — M79673 Pain in unspecified foot: Secondary | ICD-10-CM | POA: Diagnosis not present

## 2014-09-05 NOTE — Progress Notes (Signed)
Patient ID: Mary Cline, female   DOB: [birth year], 81 y.o.   MRN: [id number] Complaint:  Visit Type: Patient returns to my office for continued preventative foot care services. Complaint: Patient states" my nails have grown long and thick and become painful to walk and wear shoes" Patient has been diagnosed  As pre- DM with no complications. He presents for preventative foot care services. No changes to ROS  Podiatric Exam: Vascular: dorsalis pedis and posterior tibial pulses are palpable bilateral. Capillary return is immediate. Temperature gradient is WNL. Skin turgor WNL  Sensorium: Normal Semmes Weinstein monofilament test. Normal tactile sensation bilaterally. Nail Exam: Pt has thick disfigured discolored nails with subungual debris noted bilateral entire nail hallux through fifth toenails Ulcer Exam: There is no evidence of ulcer or pre-ulcerative changes or infection. Orthopedic Exam: Muscle tone and strength are WNL. No limitations in general ROM. No crepitus or effusions noted. Foot type and digits show no abnormalities. Bony prominences are unremarkable. Skin: No Porokeratosis. No infection or ulcers  Diagnosis:  Tinea unguium, Pain in right toe, pain in left toes  Treatment & Plan Procedures and Treatment: Consent by patient was obtained for treatment procedures. The patient understood the discussion of treatment and procedures well. All questions were answered thoroughly reviewed. Debridement of mycotic and hypertrophic toenails, 1 through 5 bilateral and clearing of subungual debris. No ulceration, no infection noted.  Return Visit-Office Procedure: Patient instructed to return to the office for a follow up visit 3 months for continued evaluation and treatment.

## 2014-09-12 DIAGNOSIS — H35371 Puckering of macula, right eye: Secondary | ICD-10-CM | POA: Diagnosis not present

## 2014-09-12 DIAGNOSIS — H15831 Staphyloma posticum, right eye: Secondary | ICD-10-CM | POA: Diagnosis not present

## 2014-09-12 DIAGNOSIS — H3531 Nonexudative age-related macular degeneration: Secondary | ICD-10-CM | POA: Diagnosis not present

## 2014-09-12 DIAGNOSIS — H4011X0 Primary open-angle glaucoma, stage unspecified: Secondary | ICD-10-CM | POA: Diagnosis not present

## 2014-10-09 DIAGNOSIS — K5900 Constipation, unspecified: Secondary | ICD-10-CM | POA: Diagnosis not present

## 2014-10-09 DIAGNOSIS — K219 Gastro-esophageal reflux disease without esophagitis: Secondary | ICD-10-CM | POA: Diagnosis not present

## 2014-11-20 DIAGNOSIS — K219 Gastro-esophageal reflux disease without esophagitis: Secondary | ICD-10-CM | POA: Diagnosis not present

## 2014-11-20 DIAGNOSIS — M545 Low back pain: Secondary | ICD-10-CM | POA: Diagnosis not present

## 2014-11-20 DIAGNOSIS — Z681 Body mass index (BMI) 19 or less, adult: Secondary | ICD-10-CM | POA: Diagnosis not present

## 2014-11-20 DIAGNOSIS — I10 Essential (primary) hypertension: Secondary | ICD-10-CM | POA: Diagnosis not present

## 2014-12-12 ENCOUNTER — Ambulatory Visit: Payer: Medicare PPO | Admitting: Podiatry

## 2014-12-17 DIAGNOSIS — I517 Cardiomegaly: Secondary | ICD-10-CM | POA: Diagnosis not present

## 2014-12-17 DIAGNOSIS — R1084 Generalized abdominal pain: Secondary | ICD-10-CM | POA: Diagnosis not present

## 2014-12-17 DIAGNOSIS — I5021 Acute systolic (congestive) heart failure: Secondary | ICD-10-CM | POA: Diagnosis not present

## 2014-12-17 DIAGNOSIS — I5040 Unspecified combined systolic (congestive) and diastolic (congestive) heart failure: Secondary | ICD-10-CM | POA: Diagnosis not present

## 2014-12-17 DIAGNOSIS — R69 Illness, unspecified: Secondary | ICD-10-CM | POA: Diagnosis not present

## 2014-12-17 DIAGNOSIS — I2510 Atherosclerotic heart disease of native coronary artery without angina pectoris: Secondary | ICD-10-CM | POA: Diagnosis not present

## 2014-12-17 DIAGNOSIS — I255 Ischemic cardiomyopathy: Secondary | ICD-10-CM | POA: Diagnosis not present

## 2014-12-17 DIAGNOSIS — I10 Essential (primary) hypertension: Secondary | ICD-10-CM | POA: Diagnosis not present

## 2014-12-17 DIAGNOSIS — R0781 Pleurodynia: Secondary | ICD-10-CM | POA: Diagnosis not present

## 2014-12-17 DIAGNOSIS — K449 Diaphragmatic hernia without obstruction or gangrene: Secondary | ICD-10-CM | POA: Diagnosis not present

## 2014-12-17 DIAGNOSIS — R1013 Epigastric pain: Secondary | ICD-10-CM | POA: Diagnosis not present

## 2014-12-17 DIAGNOSIS — I5023 Acute on chronic systolic (congestive) heart failure: Secondary | ICD-10-CM | POA: Diagnosis not present

## 2014-12-17 DIAGNOSIS — R0602 Shortness of breath: Secondary | ICD-10-CM | POA: Diagnosis not present

## 2014-12-17 DIAGNOSIS — I509 Heart failure, unspecified: Secondary | ICD-10-CM | POA: Diagnosis not present

## 2014-12-17 DIAGNOSIS — Z66 Do not resuscitate: Secondary | ICD-10-CM | POA: Diagnosis not present

## 2014-12-17 DIAGNOSIS — R748 Abnormal levels of other serum enzymes: Secondary | ICD-10-CM | POA: Diagnosis not present

## 2014-12-17 DIAGNOSIS — J986 Disorders of diaphragm: Secondary | ICD-10-CM | POA: Diagnosis not present

## 2014-12-17 DIAGNOSIS — E860 Dehydration: Secondary | ICD-10-CM | POA: Diagnosis not present

## 2014-12-22 DIAGNOSIS — G809 Cerebral palsy, unspecified: Secondary | ICD-10-CM | POA: Diagnosis not present

## 2014-12-22 DIAGNOSIS — I5020 Unspecified systolic (congestive) heart failure: Secondary | ICD-10-CM | POA: Diagnosis not present

## 2014-12-22 DIAGNOSIS — I509 Heart failure, unspecified: Secondary | ICD-10-CM | POA: Diagnosis not present

## 2014-12-22 DIAGNOSIS — M6281 Muscle weakness (generalized): Secondary | ICD-10-CM | POA: Diagnosis not present

## 2014-12-22 DIAGNOSIS — I2510 Atherosclerotic heart disease of native coronary artery without angina pectoris: Secondary | ICD-10-CM | POA: Diagnosis not present

## 2014-12-22 DIAGNOSIS — K219 Gastro-esophageal reflux disease without esophagitis: Secondary | ICD-10-CM | POA: Diagnosis not present

## 2014-12-22 DIAGNOSIS — I429 Cardiomyopathy, unspecified: Secondary | ICD-10-CM | POA: Diagnosis not present

## 2014-12-22 DIAGNOSIS — I10 Essential (primary) hypertension: Secondary | ICD-10-CM | POA: Diagnosis not present

## 2014-12-22 DIAGNOSIS — D510 Vitamin B12 deficiency anemia due to intrinsic factor deficiency: Secondary | ICD-10-CM | POA: Diagnosis not present

## 2014-12-26 DIAGNOSIS — K219 Gastro-esophageal reflux disease without esophagitis: Secondary | ICD-10-CM | POA: Diagnosis not present

## 2014-12-26 DIAGNOSIS — I509 Heart failure, unspecified: Secondary | ICD-10-CM | POA: Diagnosis not present

## 2014-12-26 DIAGNOSIS — D510 Vitamin B12 deficiency anemia due to intrinsic factor deficiency: Secondary | ICD-10-CM | POA: Diagnosis not present

## 2014-12-26 DIAGNOSIS — G809 Cerebral palsy, unspecified: Secondary | ICD-10-CM | POA: Diagnosis not present

## 2014-12-26 DIAGNOSIS — M6281 Muscle weakness (generalized): Secondary | ICD-10-CM | POA: Diagnosis not present

## 2014-12-26 DIAGNOSIS — I10 Essential (primary) hypertension: Secondary | ICD-10-CM | POA: Diagnosis not present

## 2014-12-26 DIAGNOSIS — I5020 Unspecified systolic (congestive) heart failure: Secondary | ICD-10-CM | POA: Diagnosis not present

## 2014-12-26 DIAGNOSIS — I429 Cardiomyopathy, unspecified: Secondary | ICD-10-CM | POA: Diagnosis not present

## 2014-12-26 DIAGNOSIS — I2510 Atherosclerotic heart disease of native coronary artery without angina pectoris: Secondary | ICD-10-CM | POA: Diagnosis not present

## 2014-12-27 DIAGNOSIS — I429 Cardiomyopathy, unspecified: Secondary | ICD-10-CM | POA: Diagnosis not present

## 2014-12-27 DIAGNOSIS — K219 Gastro-esophageal reflux disease without esophagitis: Secondary | ICD-10-CM | POA: Diagnosis not present

## 2014-12-27 DIAGNOSIS — I10 Essential (primary) hypertension: Secondary | ICD-10-CM | POA: Diagnosis not present

## 2014-12-27 DIAGNOSIS — G809 Cerebral palsy, unspecified: Secondary | ICD-10-CM | POA: Diagnosis not present

## 2014-12-27 DIAGNOSIS — I509 Heart failure, unspecified: Secondary | ICD-10-CM | POA: Diagnosis not present

## 2014-12-27 DIAGNOSIS — I2510 Atherosclerotic heart disease of native coronary artery without angina pectoris: Secondary | ICD-10-CM | POA: Diagnosis not present

## 2014-12-27 DIAGNOSIS — M6281 Muscle weakness (generalized): Secondary | ICD-10-CM | POA: Diagnosis not present

## 2014-12-27 DIAGNOSIS — D510 Vitamin B12 deficiency anemia due to intrinsic factor deficiency: Secondary | ICD-10-CM | POA: Diagnosis not present

## 2014-12-27 DIAGNOSIS — I5020 Unspecified systolic (congestive) heart failure: Secondary | ICD-10-CM | POA: Diagnosis not present

## 2014-12-28 DIAGNOSIS — M6281 Muscle weakness (generalized): Secondary | ICD-10-CM | POA: Diagnosis not present

## 2014-12-28 DIAGNOSIS — I2510 Atherosclerotic heart disease of native coronary artery without angina pectoris: Secondary | ICD-10-CM | POA: Diagnosis not present

## 2014-12-28 DIAGNOSIS — I429 Cardiomyopathy, unspecified: Secondary | ICD-10-CM | POA: Diagnosis not present

## 2014-12-28 DIAGNOSIS — I10 Essential (primary) hypertension: Secondary | ICD-10-CM | POA: Diagnosis not present

## 2014-12-28 DIAGNOSIS — D510 Vitamin B12 deficiency anemia due to intrinsic factor deficiency: Secondary | ICD-10-CM | POA: Diagnosis not present

## 2014-12-28 DIAGNOSIS — G809 Cerebral palsy, unspecified: Secondary | ICD-10-CM | POA: Diagnosis not present

## 2014-12-28 DIAGNOSIS — I509 Heart failure, unspecified: Secondary | ICD-10-CM | POA: Diagnosis not present

## 2014-12-28 DIAGNOSIS — I5020 Unspecified systolic (congestive) heart failure: Secondary | ICD-10-CM | POA: Diagnosis not present

## 2014-12-28 DIAGNOSIS — K219 Gastro-esophageal reflux disease without esophagitis: Secondary | ICD-10-CM | POA: Diagnosis not present

## 2014-12-29 DIAGNOSIS — I429 Cardiomyopathy, unspecified: Secondary | ICD-10-CM | POA: Diagnosis not present

## 2014-12-29 DIAGNOSIS — K219 Gastro-esophageal reflux disease without esophagitis: Secondary | ICD-10-CM | POA: Diagnosis not present

## 2014-12-29 DIAGNOSIS — D510 Vitamin B12 deficiency anemia due to intrinsic factor deficiency: Secondary | ICD-10-CM | POA: Diagnosis not present

## 2014-12-29 DIAGNOSIS — I5020 Unspecified systolic (congestive) heart failure: Secondary | ICD-10-CM | POA: Diagnosis not present

## 2014-12-29 DIAGNOSIS — I10 Essential (primary) hypertension: Secondary | ICD-10-CM | POA: Diagnosis not present

## 2014-12-29 DIAGNOSIS — M6281 Muscle weakness (generalized): Secondary | ICD-10-CM | POA: Diagnosis not present

## 2014-12-29 DIAGNOSIS — I509 Heart failure, unspecified: Secondary | ICD-10-CM | POA: Diagnosis not present

## 2014-12-29 DIAGNOSIS — I2510 Atherosclerotic heart disease of native coronary artery without angina pectoris: Secondary | ICD-10-CM | POA: Diagnosis not present

## 2014-12-29 DIAGNOSIS — G809 Cerebral palsy, unspecified: Secondary | ICD-10-CM | POA: Diagnosis not present

## 2015-01-01 DIAGNOSIS — I5020 Unspecified systolic (congestive) heart failure: Secondary | ICD-10-CM | POA: Diagnosis not present

## 2015-01-01 DIAGNOSIS — I10 Essential (primary) hypertension: Secondary | ICD-10-CM | POA: Diagnosis not present

## 2015-01-01 DIAGNOSIS — I2510 Atherosclerotic heart disease of native coronary artery without angina pectoris: Secondary | ICD-10-CM | POA: Diagnosis not present

## 2015-01-01 DIAGNOSIS — M6281 Muscle weakness (generalized): Secondary | ICD-10-CM | POA: Diagnosis not present

## 2015-01-01 DIAGNOSIS — D510 Vitamin B12 deficiency anemia due to intrinsic factor deficiency: Secondary | ICD-10-CM | POA: Diagnosis not present

## 2015-01-01 DIAGNOSIS — K219 Gastro-esophageal reflux disease without esophagitis: Secondary | ICD-10-CM | POA: Diagnosis not present

## 2015-01-01 DIAGNOSIS — G809 Cerebral palsy, unspecified: Secondary | ICD-10-CM | POA: Diagnosis not present

## 2015-01-01 DIAGNOSIS — I509 Heart failure, unspecified: Secondary | ICD-10-CM | POA: Diagnosis not present

## 2015-01-01 DIAGNOSIS — I429 Cardiomyopathy, unspecified: Secondary | ICD-10-CM | POA: Diagnosis not present

## 2015-01-03 DIAGNOSIS — K219 Gastro-esophageal reflux disease without esophagitis: Secondary | ICD-10-CM | POA: Diagnosis not present

## 2015-01-03 DIAGNOSIS — M6281 Muscle weakness (generalized): Secondary | ICD-10-CM | POA: Diagnosis not present

## 2015-01-03 DIAGNOSIS — I509 Heart failure, unspecified: Secondary | ICD-10-CM | POA: Diagnosis not present

## 2015-01-03 DIAGNOSIS — I10 Essential (primary) hypertension: Secondary | ICD-10-CM | POA: Diagnosis not present

## 2015-01-03 DIAGNOSIS — I5020 Unspecified systolic (congestive) heart failure: Secondary | ICD-10-CM | POA: Diagnosis not present

## 2015-01-03 DIAGNOSIS — I2510 Atherosclerotic heart disease of native coronary artery without angina pectoris: Secondary | ICD-10-CM | POA: Diagnosis not present

## 2015-01-03 DIAGNOSIS — D510 Vitamin B12 deficiency anemia due to intrinsic factor deficiency: Secondary | ICD-10-CM | POA: Diagnosis not present

## 2015-01-03 DIAGNOSIS — G809 Cerebral palsy, unspecified: Secondary | ICD-10-CM | POA: Diagnosis not present

## 2015-01-03 DIAGNOSIS — I429 Cardiomyopathy, unspecified: Secondary | ICD-10-CM | POA: Diagnosis not present

## 2015-01-05 DIAGNOSIS — M6281 Muscle weakness (generalized): Secondary | ICD-10-CM | POA: Diagnosis not present

## 2015-01-05 DIAGNOSIS — G809 Cerebral palsy, unspecified: Secondary | ICD-10-CM | POA: Diagnosis not present

## 2015-01-05 DIAGNOSIS — K219 Gastro-esophageal reflux disease without esophagitis: Secondary | ICD-10-CM | POA: Diagnosis not present

## 2015-01-05 DIAGNOSIS — I429 Cardiomyopathy, unspecified: Secondary | ICD-10-CM | POA: Diagnosis not present

## 2015-01-05 DIAGNOSIS — D510 Vitamin B12 deficiency anemia due to intrinsic factor deficiency: Secondary | ICD-10-CM | POA: Diagnosis not present

## 2015-01-05 DIAGNOSIS — I509 Heart failure, unspecified: Secondary | ICD-10-CM | POA: Diagnosis not present

## 2015-01-05 DIAGNOSIS — I2510 Atherosclerotic heart disease of native coronary artery without angina pectoris: Secondary | ICD-10-CM | POA: Diagnosis not present

## 2015-01-05 DIAGNOSIS — I5020 Unspecified systolic (congestive) heart failure: Secondary | ICD-10-CM | POA: Diagnosis not present

## 2015-01-05 DIAGNOSIS — I10 Essential (primary) hypertension: Secondary | ICD-10-CM | POA: Diagnosis not present

## 2015-01-08 DIAGNOSIS — I429 Cardiomyopathy, unspecified: Secondary | ICD-10-CM | POA: Diagnosis not present

## 2015-01-08 DIAGNOSIS — I5020 Unspecified systolic (congestive) heart failure: Secondary | ICD-10-CM | POA: Diagnosis not present

## 2015-01-08 DIAGNOSIS — M6281 Muscle weakness (generalized): Secondary | ICD-10-CM | POA: Diagnosis not present

## 2015-01-08 DIAGNOSIS — D510 Vitamin B12 deficiency anemia due to intrinsic factor deficiency: Secondary | ICD-10-CM | POA: Diagnosis not present

## 2015-01-08 DIAGNOSIS — G809 Cerebral palsy, unspecified: Secondary | ICD-10-CM | POA: Diagnosis not present

## 2015-01-08 DIAGNOSIS — I10 Essential (primary) hypertension: Secondary | ICD-10-CM | POA: Diagnosis not present

## 2015-01-08 DIAGNOSIS — I509 Heart failure, unspecified: Secondary | ICD-10-CM | POA: Diagnosis not present

## 2015-01-08 DIAGNOSIS — K219 Gastro-esophageal reflux disease without esophagitis: Secondary | ICD-10-CM | POA: Diagnosis not present

## 2015-01-08 DIAGNOSIS — I2510 Atherosclerotic heart disease of native coronary artery without angina pectoris: Secondary | ICD-10-CM | POA: Diagnosis not present

## 2015-01-10 DIAGNOSIS — K219 Gastro-esophageal reflux disease without esophagitis: Secondary | ICD-10-CM | POA: Diagnosis not present

## 2015-01-10 DIAGNOSIS — I5020 Unspecified systolic (congestive) heart failure: Secondary | ICD-10-CM | POA: Diagnosis not present

## 2015-01-10 DIAGNOSIS — I429 Cardiomyopathy, unspecified: Secondary | ICD-10-CM | POA: Diagnosis not present

## 2015-01-10 DIAGNOSIS — I509 Heart failure, unspecified: Secondary | ICD-10-CM | POA: Diagnosis not present

## 2015-01-10 DIAGNOSIS — G809 Cerebral palsy, unspecified: Secondary | ICD-10-CM | POA: Diagnosis not present

## 2015-01-10 DIAGNOSIS — D510 Vitamin B12 deficiency anemia due to intrinsic factor deficiency: Secondary | ICD-10-CM | POA: Diagnosis not present

## 2015-01-10 DIAGNOSIS — I10 Essential (primary) hypertension: Secondary | ICD-10-CM | POA: Diagnosis not present

## 2015-01-10 DIAGNOSIS — I2510 Atherosclerotic heart disease of native coronary artery without angina pectoris: Secondary | ICD-10-CM | POA: Diagnosis not present

## 2015-01-10 DIAGNOSIS — M6281 Muscle weakness (generalized): Secondary | ICD-10-CM | POA: Diagnosis not present

## 2015-01-11 DIAGNOSIS — I509 Heart failure, unspecified: Secondary | ICD-10-CM | POA: Diagnosis not present

## 2015-01-11 DIAGNOSIS — I10 Essential (primary) hypertension: Secondary | ICD-10-CM | POA: Diagnosis not present

## 2015-01-11 DIAGNOSIS — I5020 Unspecified systolic (congestive) heart failure: Secondary | ICD-10-CM | POA: Diagnosis not present

## 2015-01-11 DIAGNOSIS — K219 Gastro-esophageal reflux disease without esophagitis: Secondary | ICD-10-CM | POA: Diagnosis not present

## 2015-01-11 DIAGNOSIS — G809 Cerebral palsy, unspecified: Secondary | ICD-10-CM | POA: Diagnosis not present

## 2015-01-11 DIAGNOSIS — I2510 Atherosclerotic heart disease of native coronary artery without angina pectoris: Secondary | ICD-10-CM | POA: Diagnosis not present

## 2015-01-11 DIAGNOSIS — D510 Vitamin B12 deficiency anemia due to intrinsic factor deficiency: Secondary | ICD-10-CM | POA: Diagnosis not present

## 2015-01-11 DIAGNOSIS — I429 Cardiomyopathy, unspecified: Secondary | ICD-10-CM | POA: Diagnosis not present

## 2015-01-11 DIAGNOSIS — M6281 Muscle weakness (generalized): Secondary | ICD-10-CM | POA: Diagnosis not present

## 2015-01-15 DIAGNOSIS — I5020 Unspecified systolic (congestive) heart failure: Secondary | ICD-10-CM | POA: Diagnosis not present

## 2015-01-15 DIAGNOSIS — K219 Gastro-esophageal reflux disease without esophagitis: Secondary | ICD-10-CM | POA: Diagnosis not present

## 2015-01-15 DIAGNOSIS — G809 Cerebral palsy, unspecified: Secondary | ICD-10-CM | POA: Diagnosis not present

## 2015-01-15 DIAGNOSIS — I10 Essential (primary) hypertension: Secondary | ICD-10-CM | POA: Diagnosis not present

## 2015-01-15 DIAGNOSIS — I509 Heart failure, unspecified: Secondary | ICD-10-CM | POA: Diagnosis not present

## 2015-01-15 DIAGNOSIS — I429 Cardiomyopathy, unspecified: Secondary | ICD-10-CM | POA: Diagnosis not present

## 2015-01-15 DIAGNOSIS — I2510 Atherosclerotic heart disease of native coronary artery without angina pectoris: Secondary | ICD-10-CM | POA: Diagnosis not present

## 2015-01-15 DIAGNOSIS — M6281 Muscle weakness (generalized): Secondary | ICD-10-CM | POA: Diagnosis not present

## 2015-01-15 DIAGNOSIS — D510 Vitamin B12 deficiency anemia due to intrinsic factor deficiency: Secondary | ICD-10-CM | POA: Diagnosis not present

## 2015-02-12 ENCOUNTER — Encounter: Payer: Self-pay | Admitting: Podiatry

## 2015-02-12 ENCOUNTER — Ambulatory Visit (INDEPENDENT_AMBULATORY_CARE_PROVIDER_SITE_OTHER): Payer: Medicare PPO | Admitting: Podiatry

## 2015-02-12 DIAGNOSIS — M79673 Pain in unspecified foot: Secondary | ICD-10-CM

## 2015-02-12 DIAGNOSIS — B351 Tinea unguium: Secondary | ICD-10-CM

## 2015-02-14 NOTE — Progress Notes (Signed)
Subjective:     Patient ID: Mary Cline, female   DOB: 08/01/1922, 81 y.o.   MRN: [id number]  HPI patient presents with thick yellow brittle nailbeds 1-5 of both feet that can become painful and she cannot cut them herself   Review of Systems     Objective:   Physical Exam Neurovascular status unchanged with thick yellow brittle nailbeds 1-5 both feet that are painful when palpated bilateral 1-5    Assessment:     Mycotic nail infections with pain 1-5 nailbeds    Plan:     H&P condition reviewed with patient and debridement of nailbeds with no iatrogenic bleeding noted. Patient be seen back to recheck in 3 months

## 2015-03-12 ENCOUNTER — Telehealth: Payer: Self-pay | Admitting: Cardiovascular Disease

## 2015-03-12 NOTE — Telephone Encounter (Signed)
New patient scheduled for Dr Oval Linsey - Self Referred. Patient gave [place] contact information for [facility] in [facility]. Request faxed to [facility] to obtain records on patient for her upcoming appointment on 04/03/15 with Dr Oval Linsey.  Faxed on 12/12/116. lp

## 2015-03-14 ENCOUNTER — Telehealth: Payer: Self-pay | Admitting: Cardiovascular Disease

## 2015-03-14 NOTE — Telephone Encounter (Signed)
Received records from [facility] for appointment on 04/03/15 with Dr Oval Linsey.  Records given to [facility] (medical records) for Dr Blenda Mounts schedule on 04/03/15.  lp

## 2015-03-19 DIAGNOSIS — I10 Essential (primary) hypertension: Secondary | ICD-10-CM | POA: Diagnosis not present

## 2015-03-19 DIAGNOSIS — I509 Heart failure, unspecified: Secondary | ICD-10-CM | POA: Diagnosis not present

## 2015-03-19 DIAGNOSIS — M1990 Unspecified osteoarthritis, unspecified site: Secondary | ICD-10-CM | POA: Diagnosis not present

## 2015-03-19 DIAGNOSIS — E119 Type 2 diabetes mellitus without complications: Secondary | ICD-10-CM | POA: Diagnosis not present

## 2015-03-19 DIAGNOSIS — Z23 Encounter for immunization: Secondary | ICD-10-CM | POA: Diagnosis not present

## 2015-04-02 NOTE — Progress Notes (Signed)
Cardiology Office Note   Date:  04/03/2015   ID:  [name], DOB Jan 13, 1923, MRN [id number]  PCP:  Mary Blackbird, MD  Cardiologist:   Mary Harness, MD   Chief Complaint  Patient presents with  . CHF    New Pt   History of Present Illness: Mary Cline is a 82 y.o. female with hypertension, diabetes, GERD, giant cell arteritis, chronic systolic heart failure LVEF 15-20%, and LBBB here for follow up on chronic systolic heart failure.  [name] was seen by [name] (cardiolgist, [facility]) on 11/2014.  At that time she was following up after a hospitalization for systolic heart failure that was likely ishcemic.  She was initially hospitalized for shortness of breath and was found to have an NSTEMI.  Troponin peaked at 0.17.  BNP was 33,000. EKG revealed LBBB and no prior EKGs were available for comparison.  Her symptoms improved with diuresis.   Her family decided not to pursue aggressive measures given her age.  Her only complaint at follow up with [name] was frequent urination due to lasix.  At that appointment she was noted to be hypotensive.  She was taking both metoprolol and carvedilol, so metoprolol was discontinue.  Her lasix was reduced to MWF only.    Ms. Heyde reports having good days and bad days.  When she has the energy she likes to walk in her neighborhood.  After doing this she is tired for the next several days.  She walks with a walker and denies any recent fall.  She denies any chest pain, shortness of breath or lower extremity edema.  She does not report orthopnea, but she sleeps in a medical bed with the head elevated.  She follows a 1500mg  sodium diet and 1L fluid restriction.  She weighs herself daily and her weight is stable.  Her main complaint is nocturia.  She takes her lasix in the mornings on MWF.  She has nocturia every night.  She also reports leg tingling that makes it difficult for her to sleep.   Past Medical  History  Diagnosis Date  . Hypertension   . GERD (gastroesophageal reflux disease)   . Diabetes mellitus   . Arthritis   . Macular degeneration disease   . Anemia   . Hernia   . Chronic systolic heart failure ([place]) 04/03/2015  . Essential hypertension 04/03/2015  . Hyperlipidemia 04/03/2015    Past Surgical History  Procedure Laterality Date  . Angioplasty    . Hysterotomy    . Appendectomy       Current Outpatient Prescriptions  Medication Sig Dispense Refill  . carvedilol (COREG) 3.125 MG tablet Take 1 tablet by mouth daily.    . clopidogrel (PLAVIX) 75 MG tablet Take 1 tablet by mouth 2 (two) times daily.    . furosemide (LASIX) 20 MG tablet Take 1 tablet by mouth daily as needed. For weight gain more that 2 pounds in a day or 5 pounds in a week.    Mary Cline glycerin adult 2 G SUPP Place 1 suppository rectally once as needed for moderate constipation.    . NONFORMULARY OR COMPOUNDED ITEM Ketoconazole and fluticasone compounded cream.  Apply as needed to affected areas as needed as directed.    Mary Cline omeprazole (PRILOSEC) 40 MG capsule Take 40 mg by mouth daily.    . traMADol (ULTRAM) 50 MG tablet Take 1 tablet (50 mg total) by mouth 3 (three) times daily as needed  for severe pain. 20 tablet 0  . valsartan (DIOVAN) 80 MG tablet Take 1 tablet by mouth daily.      No current facility-administered medications for this visit.    Allergies:   Fish allergy; Iodine; Acth; Actonel; Atorvastatin; Ciprofloxacin; Codeine; Demerol; Lactose intolerance (gi); Lovastatin; Macrobid; Other; Paxil; Penicillins; Sulfa antibiotics; Tetracyclines & related; Vioxx; Vitamin d analogs; and Garamycin    Social History:  The patient  reports that she has never smoked. She has never used smokeless tobacco. She reports that she does not drink alcohol or use illicit drugs.   Family History:  The patient's family history includes Cancer in her sister; Diabetes in her brother, brother, brother, and maternal grandmother;  Heart Problems in her daughter, daughter, maternal grandfather, paternal grandfather, and paternal grandmother.    ROS:  Please see the history of present illness.   Otherwise, review of systems are positive for leg pain at night, reduced eye sight and hearing, memory deficits, feeling cold.   All other systems are reviewed and negative.    PHYSICAL EXAM: VS:  BP 102/62 mmHg  Pulse 80  Ht 5' (1.524 m)  Wt 41.141 kg (90 lb 11.2 oz)  BMI 17.71 kg/m2 , BMI Body mass index is 17.71 kg/(m^2). GENERAL:  Frail, elderly woman in no acute distress. HEENT:  Pupils equal round and reactive, fundi not visualized, oral mucosa unremarkable NECK:  No jugular venous distention, waveform within normal limits, carotid upstroke brisk and symmetric, no bruits, no thyromegaly LYMPHATICS:  No cervical adenopathy LUNGS:  Clear to auscultation bilaterally HEART:  RRR.  PMI not displaced or sustained,S1 and S2 within normal limits, S3 present. No S4, no clicks, no rubs, no murmurs ABD:  Flat, positive bowel sounds normal in frequency in pitch, no bruits, no rebound, no guarding, no midline pulsatile mass, no hepatomegaly, no splenomegaly EXT:  2 plus pulses throughout, no edema, no cyanosis no clubbing SKIN:  No rashes no nodules NEURO:  Cranial nerves II through XII grossly intact, motor grossly intact throughout PSYCH:  Cognitively intact, oriented to person place and time    EKG:  EKG is ordered today. The ekg ordered today demonstrates sinus rhythm.  LBBB.   Echo 12/18/14: LVEF [id number]  Grade 3 diastolic dysfunction.  Mod-severely dilated LA.  RA mildly dilated.  Mild aortic valve sclerosis without stenosis.  Moderate mitral valve thickening.  Mild TR.    Recent Labs: No results found for requested labs within last 365 days.    Lipid Panel    Component Value Date/Time   CHOL  09/13/2009 0620    157        ATP III CLASSIFICATION:  <200     mg/dL   Desirable  200-239  mg/dL   Borderline High  >=240     mg/dL   High          TRIG 112 09/13/2009 0620   HDL 41 09/13/2009 0620   CHOLHDL 3.8 09/13/2009 0620   VLDL 22 09/13/2009 0620   LDLCALC  09/13/2009 0620    94        Total Cholesterol/HDL:CHD Risk Coronary Heart Disease Risk Table                     Men   Women  1/2 Average Risk   3.4   3.3  Average Risk       5.0   4.4  2 X Average Risk   9.6   7.1  3  X Average Risk  23.4   11.0        Use the calculated Patient Ratio above and the CHD Risk Table to determine the patient's CHD Risk.        ATP III CLASSIFICATION (LDL):  <100     mg/dL   Optimal  100-129  mg/dL   Near or Above                    Optimal  130-159  mg/dL   Borderline  160-189  mg/dL   High  >190     mg/dL   Very High      Wt Readings from Last 3 Encounters:  04/03/15 41.141 kg (90 lb 11.2 oz)  11/08/12 45.473 kg (100 lb 4 oz)      ASSESSMENT AND PLAN:  # Chronic systolic heart failure: Ms. Lemert is doing well at this time.  She is euvolemic.  She has been awakening frequently due to lasix.  We will try to reduce her lasix to daily as needed based on weight gain.  She is already weighing herself daily and her weight has been stable.  She does a good job with limiting salt and fluid intake.  She will continue to weigh herself daily and take lasix 20 mg daily if her weight is up by 2lb in one day or 5lb in one week.  # Hypertension: BP low but she denies dizziness or lightheadedness.  Continue carvedilol.  # CAD: No active symptoms.  Continue plavix and reduce aspirin to 81mg  daily.  Continue carvedilol.  She is unable to tolerate statins due to myalgias.  # Fatigue: Ms. Wahlert reports a remote history of hypothyroidism.  We will check her thyroid function with TSH and fT4.  Current medicines are reviewed at length with the patient today.  The patient does not have concerns regarding medicines.  The following changes have been made:  Change lasix to as needed dosing.  Labs/ tests ordered  today include:   Orders Placed This Encounter  Procedures  . T4, free  . TSH  . EKG 12-Lead     Disposition:   FU with Rashea Hoskie C. Oval Linsey, MD, [facility] in 6 months.    This note was written with the assistance of speech recognition software.  Please excuse any transcriptional errors.  Signed, Kristin Barcus C. Oval Linsey, MD, [facility]  04/03/2015 1:43 PM    [place]

## 2015-04-03 ENCOUNTER — Encounter: Payer: Self-pay | Admitting: Cardiovascular Disease

## 2015-04-03 ENCOUNTER — Ambulatory Visit (INDEPENDENT_AMBULATORY_CARE_PROVIDER_SITE_OTHER): Payer: Medicare PPO | Admitting: Cardiovascular Disease

## 2015-04-03 VITALS — BP 102/62 | HR 80 | Ht 60.0 in | Wt 90.7 lb

## 2015-04-03 DIAGNOSIS — R5383 Other fatigue: Secondary | ICD-10-CM | POA: Diagnosis not present

## 2015-04-03 DIAGNOSIS — E039 Hypothyroidism, unspecified: Secondary | ICD-10-CM | POA: Diagnosis not present

## 2015-04-03 DIAGNOSIS — I447 Left bundle-branch block, unspecified: Secondary | ICD-10-CM | POA: Diagnosis not present

## 2015-04-03 DIAGNOSIS — I2510 Atherosclerotic heart disease of native coronary artery without angina pectoris: Secondary | ICD-10-CM

## 2015-04-03 DIAGNOSIS — E785 Hyperlipidemia, unspecified: Secondary | ICD-10-CM

## 2015-04-03 DIAGNOSIS — I10 Essential (primary) hypertension: Secondary | ICD-10-CM

## 2015-04-03 DIAGNOSIS — I5022 Chronic systolic (congestive) heart failure: Secondary | ICD-10-CM

## 2015-04-03 HISTORY — DX: Essential (primary) hypertension: I10

## 2015-04-03 HISTORY — DX: Chronic systolic (congestive) heart failure: I50.22

## 2015-04-03 HISTORY — DX: Hyperlipidemia, unspecified: E78.5

## 2015-04-03 LAB — TSH: TSH: 2.327 u[IU]/mL (ref 0.350–4.500)

## 2015-04-03 LAB — T4, FREE: FREE T4: 1.2 ng/dL (ref 0.80–1.80)

## 2015-04-03 NOTE — Patient Instructions (Signed)
Medication Instructions:  DECREASE Aspirin to 81 mg TAKE Furosemide (Lasix) 20 mg daily as needed for weight gain greater than 2 pounds in a day or 5 pounds in a week.  Labwork: Your physician recommends that you return for lab work TODAY.  Testing/Procedures: NONE  Follow-Up: Dr Oval Linsey recommends that you schedule a follow-up appointment in 6 months. You will receive a reminder letter in the mail two months in advance. If you don't receive a letter, please call our office to schedule the follow-up appointment.  If you need a refill on your cardiac medications before your next appointment, please call your pharmacy.

## 2015-04-09 ENCOUNTER — Telehealth: Payer: Self-pay | Admitting: *Deleted

## 2015-04-09 NOTE — Telephone Encounter (Signed)
Spoke to patient. Result given . Verbalized understanding  

## 2015-04-09 NOTE — Telephone Encounter (Signed)
-----   Message from Skeet Latch, MD sent at 04/08/2015  3:35 PM EST ----- Normal thyroid function

## 2015-04-18 ENCOUNTER — Other Ambulatory Visit: Payer: Self-pay | Admitting: Cardiovascular Disease

## 2015-04-18 NOTE — Telephone Encounter (Signed)
°*  STAT* If patient is at the pharmacy, call can be transferred to refill team.   1. Which medications need to be refilled? (please list name of each medication and dose if known) Clopidogrel and Valsartan  2. Which pharmacy/location (including street and city if local pharmacy) is medication to be sent to?[facility]  3. Do they need a 30 day or 90 day supply? 90 and refills

## 2015-04-19 MED ORDER — CLOPIDOGREL BISULFATE 75 MG PO TABS: 75.0000 mg | ORAL_TABLET | Freq: Two times a day (BID) | ORAL | Status: DC

## 2015-04-19 MED ORDER — VALSARTAN 80 MG PO TABS: 80.0000 mg | ORAL_TABLET | Freq: Every day | ORAL | Status: DC

## 2015-04-19 NOTE — Telephone Encounter (Signed)
Rx(s) sent to pharmacy electronically.  

## 2015-04-23 ENCOUNTER — Telehealth: Payer: Self-pay | Admitting: *Deleted

## 2015-04-23 MED ORDER — CLOPIDOGREL BISULFATE 75 MG PO TABS: 75.0000 mg | ORAL_TABLET | Freq: Every day | ORAL | Status: DC

## 2015-04-23 NOTE — Telephone Encounter (Signed)
Spoke to [facility], patient is taking clopidogrel 75 mg one tablet daily.

## 2015-04-23 NOTE — Telephone Encounter (Signed)
LEFT MESSAGE FOR MARCIA ODELL TO CALL BACK  RECEIVED PRIOR AUTHORIZATION FOR CLOPIDOGREL 75 MG  ONE TABLET  TWICE A DAY FOR [employer]  MAIL ORDER.   ERROR WITH PREVIOUS REFILL PRESCRIPTION.  DISCONTINUE/CANCELLED PREVIOUS REFILL FOR PRESCRIPTION OF TWICE A DAY.   NEW PRESCRIPTION SENT TO [employer] FOR CLOPIDOGREL 75 MG ONE TABLET DAILY. #90  FOR 90 DAY SUPPLY X 3 REFILL.

## 2015-04-27 ENCOUNTER — Other Ambulatory Visit: Payer: Self-pay | Admitting: *Deleted

## 2015-04-27 ENCOUNTER — Telehealth: Payer: Self-pay | Admitting: Cardiovascular Disease

## 2015-04-27 NOTE — Telephone Encounter (Signed)
New message     Receive a letter from  insurance company medication clopidogrel is not cover  - is there anything similar

## 2015-04-27 NOTE — Telephone Encounter (Signed)
Spoke with pt dtr, aware have spoke to [place] and everything is taken care of.

## 2015-05-14 ENCOUNTER — Telehealth: Payer: Self-pay | Admitting: Cardiovascular Disease

## 2015-05-14 MED ORDER — VALSARTAN 80 MG PO TABS: 80.0000 mg | ORAL_TABLET | Freq: Two times a day (BID) | ORAL | Status: DC

## 2015-05-14 NOTE — Telephone Encounter (Signed)
CHANGE PATIENT MEDICATION LIST PER INFORMATION FROM DAUGHTER NEW PRESCRIPTION WAS SENT TO [employer] DAUGHTER AWARE. PATIENT WAS TAKING MEDICATION PER OFFICE NOT FROM PREVIOUS CARDOLOGIST

## 2015-05-14 NOTE — Telephone Encounter (Signed)
She told you a wrong dose of medicine that pt was on. She is taking Valsartan 80 mg 1 a day and it should be twice a day,She will new prescription sent to [facility] order with enough pills for the correct dose please.She will need 180 and refills.Marland Kitchen

## 2015-05-21 ENCOUNTER — Other Ambulatory Visit: Payer: Self-pay | Admitting: *Deleted

## 2015-06-12 ENCOUNTER — Ambulatory Visit (INDEPENDENT_AMBULATORY_CARE_PROVIDER_SITE_OTHER): Payer: Medicare PPO | Admitting: Sports Medicine

## 2015-06-12 ENCOUNTER — Encounter: Payer: Self-pay | Admitting: Sports Medicine

## 2015-06-12 DIAGNOSIS — E119 Type 2 diabetes mellitus without complications: Secondary | ICD-10-CM

## 2015-06-12 DIAGNOSIS — M21619 Bunion of unspecified foot: Secondary | ICD-10-CM

## 2015-06-12 DIAGNOSIS — B351 Tinea unguium: Secondary | ICD-10-CM | POA: Diagnosis not present

## 2015-06-12 DIAGNOSIS — R7303 Prediabetes: Secondary | ICD-10-CM

## 2015-06-12 DIAGNOSIS — M79673 Pain in unspecified foot: Secondary | ICD-10-CM

## 2015-06-12 DIAGNOSIS — Z7901 Long term (current) use of anticoagulants: Secondary | ICD-10-CM

## 2015-06-12 NOTE — Progress Notes (Signed)
Patient ID: Mary Cline, female   DOB: Feb 01, 1923, 82 y.o.   MRN: [id number] Subjective: Mary Cline is a 82 y.o. female patient seen today in office with complaint of painful thickened and elongated toenails; unable to trim. Patient denies history of Diabetes, Neuropathy, or Vascular disease. States that she was consider Pre-diabetic but not on meds. Patient has no other pedal complaints at this time.   Patient Active Problem List   Diagnosis Date Noted  . Chronic systolic heart failure ([place]) 04/03/2015  . Essential hypertension 04/03/2015  . Hyperlipidemia 04/03/2015  . Retinal vascular occlusion, unspecified 11/10/2012  . Giant cell arteritis ([place]) 11/10/2012  . Headache(784.0) 11/08/2012  . Visual changes 11/08/2012    Current Outpatient Prescriptions on File Prior to Visit  Medication Sig Dispense Refill  . carvedilol (COREG) 3.125 MG tablet Take 1 tablet by mouth daily.    . clopidogrel (PLAVIX) 75 MG tablet Take 1 tablet (75 mg total) by mouth daily. 90 tablet 3  . furosemide (LASIX) 20 MG tablet Take 1 tablet by mouth daily as needed. For weight gain more that 2 pounds in a day or 5 pounds in a week.    Marland Kitchen glycerin adult 2 G SUPP Place 1 suppository rectally once as needed for moderate constipation.    . NONFORMULARY OR COMPOUNDED ITEM Ketoconazole and fluticasone compounded cream.  Apply as needed to affected areas as needed as directed.    Marland Kitchen omeprazole (PRILOSEC) 40 MG capsule Take 40 mg by mouth daily.    . traMADol (ULTRAM) 50 MG tablet Take 1 tablet (50 mg total) by mouth 3 (three) times daily as needed for severe pain. 20 tablet 0  . valsartan (DIOVAN) 80 MG tablet Take 1 tablet (80 mg total) by mouth 2 (two) times daily. 180 tablet 3   No current facility-administered medications on file prior to visit.    Allergies  Allergen Reactions  . Fish Allergy Anaphylaxis  . Iodine Anaphylaxis  . Acth [Corticotropin] Other (See Comments)    Confusion   . Actonel  [Risedronate Sodium] Nausea And Vomiting  . Atorvastatin     Blisters in mouth.    . Ciprofloxacin Nausea And Vomiting  . Codeine Other (See Comments)    Headache   . Demerol [Meperidine] Other (See Comments)    Hypotension   . Lactose Intolerance (Gi)     Intolerance.    . Lovastatin     blisters  . Macrobid [Nitrofurantoin Monohyd Macro] Nausea And Vomiting  . Other Hives and Swelling    Melons and strawberries.    . Paxil [Paroxetine Hcl] Nausea Only  . Penicillins     Pain.    . Sulfa Antibiotics Swelling  . Tetracyclines & Related     Stomach pain  . Vioxx [Rofecoxib]   . Vitamin D Analogs Other (See Comments)    Dizzy confused  . Garamycin [Gentamicin Sulfate] Rash    Objective: Physical Exam  General: Well developed, nourished, no acute distress, awake, alert and oriented x 3  Vascular: Dorsalis pedis artery 1/4 bilateral, Posterior tibial artery 1/4 bilateral, skin temperature warm to warm proximal to distal bilateral lower extremities, mild varicosities, decreased pedal hair present bilateral.  Neurological: Gross sensation present via light touch bilateral.   Dermatological: Skin is warm, dry, and supple bilateral, Nails 1-10 are tender, long, thick, and discolored with mild subungal debris, no webspace macerations present bilateral, no open lesions present bilateral, no callus/corns/hyperkeratotic tissue present bilateral. No signs of infection bilateral.  Musculoskeletal: Bunion deformities noted bilateral and lesser hammertoe that is asymptomatic. Muscular strength within normal limits without pain on range of motion. No pain with calf compression bilateral.  Assessment and Plan:  Problem List Items Addressed This Visit    None    Visit Diagnoses    Dermatophytosis of nail    -  Primary    Foot pain, unspecified laterality        Bunion        Long term (current) use of anticoagulants        Pre-diabetes          -Examined patient.  -Discussed  treatment options for painful mycotic nails. -Mechanically debrided and reduced mycotic nails with sterile nail nipper and dremel nail file without incident. -Gave toe spacer to prevent irritation at bunion onto 2nd toe -Patient to return in 3 months/as needed for follow up evaluation or sooner if symptoms worsen.  [name], DPM

## 2015-07-09 DIAGNOSIS — I110 Hypertensive heart disease with heart failure: Secondary | ICD-10-CM | POA: Diagnosis not present

## 2015-07-09 DIAGNOSIS — H547 Unspecified visual loss: Secondary | ICD-10-CM | POA: Diagnosis not present

## 2015-07-09 DIAGNOSIS — I252 Old myocardial infarction: Secondary | ICD-10-CM | POA: Diagnosis not present

## 2015-07-09 DIAGNOSIS — I2510 Atherosclerotic heart disease of native coronary artery without angina pectoris: Secondary | ICD-10-CM | POA: Diagnosis not present

## 2015-07-09 DIAGNOSIS — E559 Vitamin D deficiency, unspecified: Secondary | ICD-10-CM | POA: Diagnosis not present

## 2015-07-09 DIAGNOSIS — I509 Heart failure, unspecified: Secondary | ICD-10-CM | POA: Diagnosis not present

## 2015-07-09 DIAGNOSIS — H9193 Unspecified hearing loss, bilateral: Secondary | ICD-10-CM | POA: Diagnosis not present

## 2015-07-09 DIAGNOSIS — D510 Vitamin B12 deficiency anemia due to intrinsic factor deficiency: Secondary | ICD-10-CM | POA: Diagnosis not present

## 2015-07-09 DIAGNOSIS — K449 Diaphragmatic hernia without obstruction or gangrene: Secondary | ICD-10-CM | POA: Diagnosis not present

## 2015-07-27 ENCOUNTER — Other Ambulatory Visit: Payer: Self-pay | Admitting: Cardiovascular Disease

## 2015-07-27 MED ORDER — CARVEDILOL 3.125 MG PO TABS: 3.1250 mg | ORAL_TABLET | Freq: Every day | ORAL | Status: DC

## 2015-07-27 NOTE — Telephone Encounter (Signed)
°*  STAT* If patient is at the pharmacy, call can be transferred to refill team.   1. Which medications need to be refilled? (please list name of each medication and dose if known) Carvedilol 3.125mg   2. Which pharmacy/location (including street and city if local pharmacy) is medication to be sent to?[facility]   3. Do they need a 30 day or 90 day supply? [place]

## 2015-07-27 NOTE — Telephone Encounter (Signed)
Rx(s) sent to pharmacy electronically.  

## 2015-08-07 DIAGNOSIS — M1990 Unspecified osteoarthritis, unspecified site: Secondary | ICD-10-CM | POA: Diagnosis not present

## 2015-08-07 DIAGNOSIS — I509 Heart failure, unspecified: Secondary | ICD-10-CM | POA: Diagnosis not present

## 2015-08-07 DIAGNOSIS — K219 Gastro-esophageal reflux disease without esophagitis: Secondary | ICD-10-CM | POA: Diagnosis not present

## 2015-08-07 DIAGNOSIS — H6120 Impacted cerumen, unspecified ear: Secondary | ICD-10-CM | POA: Diagnosis not present

## 2015-08-07 DIAGNOSIS — E1140 Type 2 diabetes mellitus with diabetic neuropathy, unspecified: Secondary | ICD-10-CM | POA: Diagnosis not present

## 2015-08-20 ENCOUNTER — Telehealth: Payer: Self-pay | Admitting: Cardiovascular Disease

## 2015-08-20 NOTE — Telephone Encounter (Signed)
Advised daughter, she will call back with update

## 2015-08-20 NOTE — Telephone Encounter (Signed)
Please ask her to take lasix 20 mg daily x 2 days.  If she is still short of breath, schedule for follow up.

## 2015-08-20 NOTE — Telephone Encounter (Signed)
.  Pt c/o Shortness Of Breath: STAT if SOB developed within the last 24 hours or pt is noticeably SOB on the phone  1. Are you currently SOB (can you hear that pt is SOB on the phone)? yes  2. How long have you been experiencing SOB? Last night-   3. Are you SOB when sitting or when up moving around? both  4. Are you currently experiencing any other symptoms? Some nausea yesterday- and diarrhea, weakness

## 2015-08-20 NOTE — Telephone Encounter (Signed)
Daughter states patient was unable to lay flat last night due to shortness of breath The short ness of breath is like it was in the past.  c/o abn pain. No swelling   Patient had diarrhea last week ( later part of the week ) weight has been 87 lbs Saturday,88.4 lbs Sunday, today's 86.5 lbs  Blood pressure 134/84 today and  Pulse 74  Patient has not used furosemide - because patien has not gain any weight.  daughter wanted to know what to do?  aware will defer to Dr Oval Linsey?

## 2015-09-04 ENCOUNTER — Other Ambulatory Visit: Payer: Self-pay | Admitting: Gastroenterology

## 2015-09-04 ENCOUNTER — Ambulatory Visit
Admission: RE | Admit: 2015-09-04 | Discharge: 2015-09-04 | Disposition: A | Discharge status: Home / Self Care | Payer: Medicare PPO | Source: Ambulatory Visit | Attending: Gastroenterology | Admitting: Gastroenterology

## 2015-09-04 DIAGNOSIS — R1013 Epigastric pain: Secondary | ICD-10-CM

## 2015-09-04 DIAGNOSIS — R140 Abdominal distension (gaseous): Secondary | ICD-10-CM

## 2015-09-04 DIAGNOSIS — R197 Diarrhea, unspecified: Secondary | ICD-10-CM | POA: Diagnosis not present

## 2015-09-17 ENCOUNTER — Ambulatory Visit (INDEPENDENT_AMBULATORY_CARE_PROVIDER_SITE_OTHER): Payer: Medicare PPO | Admitting: Sports Medicine

## 2015-09-17 ENCOUNTER — Encounter: Payer: Self-pay | Admitting: Sports Medicine

## 2015-09-17 DIAGNOSIS — M79673 Pain in unspecified foot: Secondary | ICD-10-CM

## 2015-09-17 DIAGNOSIS — B351 Tinea unguium: Secondary | ICD-10-CM

## 2015-09-17 DIAGNOSIS — R7303 Prediabetes: Secondary | ICD-10-CM

## 2015-09-17 DIAGNOSIS — M21619 Bunion of unspecified foot: Secondary | ICD-10-CM

## 2015-09-17 DIAGNOSIS — Z7901 Long term (current) use of anticoagulants: Secondary | ICD-10-CM

## 2015-09-17 NOTE — Progress Notes (Signed)
Patient ID: Mary Cline, female   DOB: [birth year], 82 y.o.   MRN: [id number]  Subjective: Mary Cline is a 82 y.o. female patient seen today in office with complaint of painful thickened and elongated toenails; unable to trim. Patient denies any changes since last visit with medical problems. Patient has no other pedal complaints at this time.   Patient Active Problem List   Diagnosis Date Noted  . Chronic systolic heart failure ([place]) 04/03/2015  . Essential hypertension 04/03/2015  . Hyperlipidemia 04/03/2015  . Retinal vascular occlusion, unspecified 11/10/2012  . Giant cell arteritis ([place]) 11/10/2012  . Headache(784.0) 11/08/2012  . Visual changes 11/08/2012    Current Outpatient Prescriptions on File Prior to Visit  Medication Sig Dispense Refill  . carvedilol (COREG) 3.125 MG tablet Take 1 tablet (3.125 mg total) by mouth daily. 90 tablet 2  . clopidogrel (PLAVIX) 75 MG tablet Take 1 tablet (75 mg total) by mouth daily. 90 tablet 3  . furosemide (LASIX) 20 MG tablet Take 1 tablet by mouth daily as needed. For weight gain more that 2 pounds in a day or 5 pounds in a week.    Marland Kitchen glycerin adult 2 G SUPP Place 1 suppository rectally once as needed for moderate constipation.    . NONFORMULARY OR COMPOUNDED ITEM Ketoconazole and fluticasone compounded cream.  Apply as needed to affected areas as needed as directed.    Marland Kitchen omeprazole (PRILOSEC) 40 MG capsule Take 40 mg by mouth daily.    . traMADol (ULTRAM) 50 MG tablet Take 1 tablet (50 mg total) by mouth 3 (three) times daily as needed for severe pain. 20 tablet 0  . valsartan (DIOVAN) 80 MG tablet Take 1 tablet (80 mg total) by mouth 2 (two) times daily. 180 tablet 3   No current facility-administered medications on file prior to visit.    Allergies  Allergen Reactions  . Fish Allergy Anaphylaxis  . Iodine Anaphylaxis  . Acth [Corticotropin] Other (See Comments)    Confusion   . Actonel [Risedronate Sodium] Nausea And  Vomiting  . Atorvastatin     Blisters in mouth.    . Ciprofloxacin Nausea And Vomiting  . Codeine Other (See Comments)    Headache   . Demerol [Meperidine] Other (See Comments)    Hypotension   . Lactose Intolerance (Gi)     Intolerance.    . Lovastatin     blisters  . Macrobid [Nitrofurantoin Monohyd Macro] Nausea And Vomiting  . Other Hives and Swelling    Melons and strawberries.    . Paxil [Paroxetine Hcl] Nausea Only  . Penicillins     Pain.    . Sulfa Antibiotics Swelling  . Tetracyclines & Related     Stomach pain  . Vioxx [Rofecoxib]   . Vitamin D Analogs Other (See Comments)    Dizzy confused  . Garamycin [Gentamicin Sulfate] Rash    Objective: Physical Exam  General: Well developed, nourished, no acute distress, awake, alert and oriented x 3  Vascular: Dorsalis pedis artery 1/4 bilateral, Posterior tibial artery 1/4 bilateral, skin temperature warm to warm proximal to distal bilateral lower extremities, mild varicosities, decreased pedal hair present bilateral.  Neurological: Gross sensation present via light touch bilateral.   Dermatological: Skin is warm, dry, and supple bilateral, Nails 1-10 are tender, long, thick, and discolored with mild subungal debris, no webspace macerations present bilateral, no open lesions present bilateral, no callus/corns/hyperkeratotic tissue present bilateral. No signs of infection bilateral.  Musculoskeletal: Bunion deformities  noted bilateral and lesser hammertoe that is asymptomatic. Muscular strength within normal limits without pain on range of motion. No pain with calf compression bilateral.  Assessment and Plan:  Problem List Items Addressed This Visit    None    Visit Diagnoses    Dermatophytosis of nail    -  Primary    Foot pain, unspecified laterality        Bunion        Long term (current) use of anticoagulants        Pre-diabetes          -Examined patient.  -Discussed treatment options for painful mycotic  nails. -Mechanically debrided and reduced mycotic nails with sterile nail nipper and dremel nail file without incident. -Continue with toe spacer to prevent irritation at bunion onto 2nd toe -Patient to return in 3 months/as needed for follow up evaluation or sooner if symptoms worsen.  [name], DPM

## 2015-10-03 ENCOUNTER — Telehealth: Payer: Self-pay | Admitting: Cardiovascular Disease

## 2015-10-03 MED ORDER — CARVEDILOL 3.125 MG PO TABS: 3.1250 mg | ORAL_TABLET | Freq: Two times a day (BID) | ORAL | Status: DC

## 2015-10-03 NOTE — Telephone Encounter (Signed)
Pt had been instructed on BID use of carvedilol, was entered in error previously as daily and filled last time for #90 for 90 days. Pt almost out of med.  Discussed w daughter - Refill for carvedilol sent to local pharmacy. Pt has f/u in 2 weeks w/ Dr Oval Linsey -- will defer for further refills pending any changes at appt. Daughter aware & voiced thanks.

## 2015-10-03 NOTE — Telephone Encounter (Signed)
Please call,concerning her Carvedilol. °

## 2015-10-15 NOTE — Progress Notes (Signed)
Cardiology Office Note   Date:  10/17/2015   ID:  Mary Cline, DOB 08/08/1922, MRN [id number]  PCP:  Antony Blackbird, MD  Cardiologist:   Skeet Latch, MD   Chief Complaint  Patient presents with  . Follow-up    pt states she is SOB    History of Present Illness: Mary Cline is a 82 y.o. female with hypertension, diabetes, GERD, giant cell arteritis, chronic systolic heart failure LVEF 15-20%, and LBBB here for follow up on chronic systolic heart failure.  Mary Cline was seen by [name] (cardiolgist, [facility]) on 11/2014.  At that time she was following up after a hospitalization for systolic heart failure that was likely ishcemic.  She was initially hospitalized for shortness of breath and was found to have an NSTEMI.  Troponin peaked at 0.17.  BNP was 33,000. EKG revealed LBBB and no prior EKGs were available for comparison.  Her symptoms improved with diuresis.   Her family decided not to pursue aggressive measures given her age.  Her only complaint at follow up with [name] was frequent urination due to lasix.  At that appointment she was noted to be hypotensive.  She was taking both metoprolol and carvedilol, so metoprolol was discontinue.  Her lasix was reduced to MWF only.  At her last appointment lasix was reduced to daily as needed.  She also noted fatigue so  thyroid function was tested, which was normal.  Overall Mary Cline has been doing well. Her weight has been stable at around 90 pounds. She tries to walk up and down her driveway several times each day and denies chest pain or shortness of breath with this activity. She has not noted any lower extremity edema. She sleeps with the head of her bed elevated chronically. She was feeling well until this morning when she developed increased shortness of breath.  She has not had any dietary indiscretions. She does note that she's had cold symptoms for the last 2 days.  She has not taken any doses  of her prn lasix lately. She does note that she's been constipated and is working with Dr. Paulita Fujita for this problem.  Her BP at home has been ranging from 107-147/44-100.   Past Medical History  Diagnosis Date  . Hypertension   . GERD (gastroesophageal reflux disease)   . Diabetes mellitus   . Arthritis   . Macular degeneration disease   . Anemia   . Hernia   . Chronic systolic heart failure ([place]) 04/03/2015  . Essential hypertension 04/03/2015  . Hyperlipidemia 04/03/2015    Past Surgical History  Procedure Laterality Date  . Angioplasty    . Hysterotomy    . Appendectomy       Current Outpatient Prescriptions  Medication Sig Dispense Refill  . aspirin 81 MG tablet Take 81 mg by mouth daily.    . carvedilol (COREG) 3.125 MG tablet Take 1 tablet (3.125 mg total) by mouth 2 (two) times daily with a meal. 60 tablet 1  . clopidogrel (PLAVIX) 75 MG tablet Take 1 tablet (75 mg total) by mouth daily. 90 tablet 3  . furosemide (LASIX) 20 MG tablet Take 1 tablet by mouth daily as needed. For weight gain more that 2 pounds in a day or 5 pounds in a week.    Marland Kitchen glycerin adult 2 G SUPP Place 1 suppository rectally once as needed for moderate constipation.    . Multiple Vitamin (MULTIVITAMIN) tablet Take 1 tablet  by mouth daily.    . NONFORMULARY OR COMPOUNDED ITEM Ketoconazole and fluticasone compounded cream.  Apply as needed to affected areas as needed as directed.    Marland Kitchen omeprazole (PRILOSEC) 40 MG capsule Take 40 mg by mouth daily.    . polyethylene glycol (MIRALAX / GLYCOLAX) packet Take 17 g by mouth daily.    . traMADol (ULTRAM) 50 MG tablet Take 1 tablet (50 mg total) by mouth 3 (three) times daily as needed for severe pain. 20 tablet 0  . sacubitril-valsartan (ENTRESTO) 49-51 MG Take 1 tablet by mouth 2 (two) times daily. 60 tablet 5   No current facility-administered medications for this visit.    Allergies:   Fish allergy; Iodine; Acth; Actonel; Atorvastatin; Ciprofloxacin; Codeine;  Demerol; Lactose intolerance (gi); Lovastatin; Macrobid; Other; Paxil; Penicillins; Sulfa antibiotics; Tetracyclines & related; Vioxx; Vitamin d analogs; and Garamycin    Social History:  The patient  reports that she has never smoked. She has never used smokeless tobacco. She reports that she does not drink alcohol or use illicit drugs.   Family History:  The patient's family history includes Cancer in her sister; Diabetes in her brother, brother, brother, and maternal grandmother; Heart Problems in her daughter, daughter, maternal grandfather, paternal grandfather, and paternal grandmother.    ROS:  Please see the history of present illness.   Otherwise, review of systems are positive for leg pain at night, reduced eye sight and hearing, memory deficits, feeling cold.   All other systems are reviewed and negative.    PHYSICAL EXAM: VS:  BP 146/88 mmHg  Pulse 94  Ht 5' (1.524 m)  Wt 90 lb 9.6 oz (41.096 kg)  BMI 17.69 kg/m2  SpO2 95% , BMI Body mass index is 17.69 kg/(m^2). GENERAL:  Frail, elderly woman in no acute distress. HEENT:  Pupils equal round and reactive, fundi not visualized, oral mucosa unremarkable NECK:  No jugular venous distention, waveform within normal limits, carotid upstroke brisk and symmetric, no bruits, no thyromegaly LYMPHATICS:  No cervical adenopathy LUNGS:  Diffuse expiratory wheezing. No crackles, or rhonchi. HEART:  RRR.  PMI not displaced or sustained,S1 and S2 within normal limits, S3 present. No S4, no clicks, no rubs, no murmurs ABD:  Flat, positive bowel sounds normal in frequency in pitch, no bruits, no rebound, no guarding, no midline pulsatile mass, no hepatomegaly, no splenomegaly.  +epigastric TTP.  EXT:  2 plus pulses throughout, no edema, no cyanosis no clubbing SKIN:  No rashes no nodules NEURO:  Cranial nerves II through XII grossly intact, motor grossly intact throughout PSYCH:  Cognitively intact, oriented to person place and time  EKG:   EKG is not ordered today. The ekg ordered 04/03/15 demonstrates sinus rhythm.  LBBB.   Echo 12/18/14: LVEF [id number]  Grade 3 diastolic dysfunction.  Mod-severely dilated LA.  RA mildly dilated.  Mild aortic valve sclerosis without stenosis.  Moderate mitral valve thickening.  Mild TR.    Recent Labs: 04/03/2015: TSH 2.327 10/16/2015: ALT 8; Brain Natriuretic Peptide 686.1*; BUN 23; Creat 1.11*; Hemoglobin 12.6; Platelets 139*; Potassium 4.8; Sodium 139    Lipid Panel    Component Value Date/Time   CHOL  09/13/2009 0620    157        ATP III CLASSIFICATION:  <200     mg/dL   Desirable  200-239  mg/dL   Borderline High  >=240    mg/dL   High          TRIG 112 09/13/2009  [id number]   HDL 41 09/13/2009 0620   CHOLHDL 3.8 09/13/2009 0620   VLDL 22 09/13/2009 0620   LDLCALC  09/13/2009 0620    94        Total Cholesterol/HDL:CHD Risk Coronary Heart Disease Risk Table                     Men   Women  1/2 Average Risk   3.4   3.3  Average Risk       5.0   4.4  2 X Average Risk   9.6   7.1  3 X Average Risk  23.4   11.0        Use the calculated Patient Ratio above and the CHD Risk Table to determine the patient's CHD Risk.        ATP III CLASSIFICATION (LDL):  <100     mg/dL   Optimal  100-129  mg/dL   Near or Above                    Optimal  130-159  mg/dL   Borderline  160-189  mg/dL   High  >190     mg/dL   Very High      Wt Readings from Last 3 Encounters:  10/16/15 90 lb 9.6 oz (41.096 kg)  04/03/15 90 lb 11.2 oz (41.141 kg)  11/08/12 100 lb 4 oz (45.473 kg)      ASSESSMENT AND PLAN:  # Chronic systolic heart failure: Mary Cline is doing well overall but has increased shortness of breath today. She appears to be near euvolemic on exam.  She does, however have some expiratory wheezing. It is unclear whether this is due to cold symptoms or volume overload. We will obtain a chest x-ray and a BNP.  We will also switch valsartan to Entresto 49/51mg  bid.  Continue lasix prn. She  was commended on her volume and salt management. If she tolerates Entresto consider adding spironolactone at her next appointment.   # Abdominal pain: Mary Cline has  epigastric tenderness to palpation. It is possible this is due to constipation. We will also check lipase and a comprehensive metabolic panel.     # Hypertension: BP well-controlled.  Continue carvedilol and switch valsartan valsartan to Entresto as above.  Current medicines are reviewed at length with the patient today.  The patient does not have concerns regarding medicines.  The following changes have been made:  Change lasix to as needed dosing.  Labs/ tests ordered today include:   Orders Placed This Encounter  Procedures  . DG Chest 2 View  . CBC with Differential/Platelet  . Comprehensive Metabolic Panel (CMET)  . Lipase  . B Nat Peptide  . Pathologist smear review     Disposition:   FU with Rechelle Niebla C. Oval Linsey, MD, [facility] in 3 months.    This note was written with the assistance of speech recognition software.  Please excuse any transcriptional errors.  Signed, Barnabas Henriques C. Oval Linsey, MD, [facility]  10/17/2015 10:18 PM    [facility]

## 2015-10-16 ENCOUNTER — Ambulatory Visit (INDEPENDENT_AMBULATORY_CARE_PROVIDER_SITE_OTHER): Payer: Medicare PPO | Admitting: Cardiovascular Disease

## 2015-10-16 ENCOUNTER — Ambulatory Visit
Admission: RE | Admit: 2015-10-16 | Discharge: 2015-10-16 | Disposition: A | Discharge status: Home / Self Care | Payer: Medicare PPO | Source: Ambulatory Visit | Attending: Cardiovascular Disease | Admitting: Cardiovascular Disease

## 2015-10-16 ENCOUNTER — Encounter: Payer: Self-pay | Admitting: Cardiovascular Disease

## 2015-10-16 VITALS — BP 146/88 | HR 94 | Ht 60.0 in | Wt 90.6 lb

## 2015-10-16 DIAGNOSIS — R0602 Shortness of breath: Secondary | ICD-10-CM

## 2015-10-16 DIAGNOSIS — R109 Unspecified abdominal pain: Secondary | ICD-10-CM | POA: Diagnosis not present

## 2015-10-16 LAB — CBC WITH DIFFERENTIAL/PLATELET
BASOS ABS: 0 {cells}/uL (ref 0–200)
Basophils Relative: 0 %
EOS ABS: 126 {cells}/uL (ref 15–500)
Eosinophils Relative: 1 %
HEMATOCRIT: 38.7 % (ref 35.0–45.0)
Hemoglobin: 12.6 g/dL (ref 11.7–15.5)
LYMPHS ABS: 9450 {cells}/uL — AB (ref 850–3900)
Lymphocytes Relative: 75 %
MCH: 30.4 pg (ref 27.0–33.0)
MCHC: 32.6 g/dL (ref 32.0–36.0)
MCV: 93.3 fL (ref 80.0–100.0)
MONO ABS: 756 {cells}/uL (ref 200–950)
MPV: 9.9 fL (ref 7.5–12.5)
Monocytes Relative: 6 %
NEUTROS PCT: 18 %
Neutro Abs: 2268 cells/uL (ref 1500–7800)
Platelets: 139 10*3/uL — ABNORMAL LOW (ref 140–400)
RBC: 4.15 MIL/uL (ref 3.80–5.10)
RDW: 14.3 % (ref 11.0–15.0)
WBC: 12.6 10*3/uL — ABNORMAL HIGH (ref 3.8–10.8)

## 2015-10-16 LAB — COMPREHENSIVE METABOLIC PANEL
ALBUMIN: 3.9 g/dL (ref 3.6–5.1)
ALT: 8 U/L (ref 6–29)
AST: 14 U/L (ref 10–35)
Alkaline Phosphatase: 57 U/L (ref 33–130)
BUN: 23 mg/dL (ref 7–25)
CALCIUM: 9 mg/dL (ref 8.6–10.4)
CHLORIDE: 102 mmol/L (ref 98–110)
CO2: 29 mmol/L (ref 20–31)
Creat: 1.11 mg/dL — ABNORMAL HIGH (ref 0.60–0.88)
Glucose, Bld: 123 mg/dL — ABNORMAL HIGH (ref 65–99)
Potassium: 4.8 mmol/L (ref 3.5–5.3)
Sodium: 139 mmol/L (ref 135–146)
Total Bilirubin: 0.5 mg/dL (ref 0.2–1.2)
Total Protein: 6.1 g/dL (ref 6.1–8.1)

## 2015-10-16 LAB — LIPASE: Lipase: 26 U/L (ref 7–60)

## 2015-10-16 MED ORDER — SACUBITRIL-VALSARTAN 49-51 MG PO TABS: 1.0000 | ORAL_TABLET | Freq: Two times a day (BID) | ORAL | Status: DC

## 2015-10-16 NOTE — Patient Instructions (Addendum)
Medication Instructions:  STOP VALSARTAN   START ENTRESTO 49/51 MG TWICE A DAY  Labwork: CBC/CMET/LIPASE/BNP AT [facility] ON THE FIRST FLOOR   Testing/Procedures: A chest x-ray takes a picture of the organs and structures inside the chest, including the heart, lungs, and blood vessels. This test can show several things, including, whether the heart is enlarges; whether fluid is building up in the lungs; and whether pacemaker / defibrillator leads are still in place. [facility]   Follow-Up: Your physician wants you to follow-up in: 3 [place] will receive a reminder letter in the mail two months in advance. If you don't receive a letter, please call our office to schedule the follow-up appointment.   If you need a refill on your cardiac medications before your next appointment, please call your pharmacy.

## 2015-10-17 ENCOUNTER — Encounter: Payer: Self-pay | Admitting: Cardiovascular Disease

## 2015-10-17 LAB — PATHOLOGIST SMEAR REVIEW

## 2015-10-17 LAB — BRAIN NATRIURETIC PEPTIDE: Brain Natriuretic Peptide: 686.1 pg/mL — ABNORMAL HIGH (ref <100)

## 2015-10-23 ENCOUNTER — Telehealth: Payer: Self-pay | Admitting: *Deleted

## 2015-10-23 DIAGNOSIS — D729 Disorder of white blood cells, unspecified: Secondary | ICD-10-CM

## 2015-10-23 NOTE — Telephone Encounter (Signed)
Follow-up       The lab tech is returning Melinda's call

## 2015-10-23 NOTE — Telephone Encounter (Signed)
-----   Message from Skeet Latch, MD sent at 10/17/2015 11:01 PM EDT ----- CBC shows abnormal WBC count.  They recommended additional testing.  Not sure if we can order immunophenotyping by flow cytometry as they suggested.  If so, please do so.  Either way, she needs to be seen by Heme/Onc.  Please forward this info and the last note to Dr. Chapman Fitch as she is not in the system.  Please have Ms. Machi follow up in 1 month instead of 3.

## 2015-10-23 NOTE — Telephone Encounter (Signed)
-----   Message from Skeet Latch, MD sent at 10/17/2015 10:13 PM EDT ----- Chest xray is stable and doesn't show evidence of infection.

## 2015-10-24 NOTE — Telephone Encounter (Signed)
Daughter aware of labs She is going to discuss with mother to see if she would like to follow up with Hematology given her age.

## 2015-10-24 NOTE — Telephone Encounter (Signed)
Pt's daughter called back and asked that RN please give her a call back.     Thank You!

## 2015-10-29 MED ORDER — SACUBITRIL-VALSARTAN 49-51 MG PO TABS: 1.0000 | ORAL_TABLET | Freq: Two times a day (BID) | ORAL | 3 refills | Status: DC

## 2015-10-29 MED ORDER — CARVEDILOL 3.125 MG PO TABS: 3.1250 mg | ORAL_TABLET | Freq: Two times a day (BID) | ORAL | 3 refills | Status: DC

## 2015-10-29 NOTE — Telephone Encounter (Signed)
Returning your call from last week. °

## 2015-10-29 NOTE — Telephone Encounter (Signed)
Spoke with daughter and patient is willing to see hematology

## 2015-10-29 NOTE — Telephone Encounter (Signed)
Left message to call back  

## 2015-10-29 NOTE — Telephone Encounter (Signed)
Follow-up     The pt's daughter is calling back to speak with Rip Harbour, returning her phone call

## 2015-10-30 ENCOUNTER — Telehealth: Payer: Self-pay | Admitting: Hematology

## 2015-10-30 NOTE — Telephone Encounter (Signed)
Referral in Epic  

## 2015-10-30 NOTE — Telephone Encounter (Signed)
Pt confirmed appt; completed intake,

## 2015-10-30 NOTE — Telephone Encounter (Signed)
Appointment 11/20/15

## 2015-11-20 ENCOUNTER — Other Ambulatory Visit (HOSPITAL_COMMUNITY)
Admission: RE | Admit: 2015-11-20 | Discharge: 2015-11-20 | Disposition: A | Discharge status: Home / Self Care | Payer: Medicare PPO | Source: Ambulatory Visit | Attending: Hematology | Admitting: Hematology

## 2015-11-20 ENCOUNTER — Ambulatory Visit (HOSPITAL_BASED_OUTPATIENT_CLINIC_OR_DEPARTMENT_OTHER): Payer: Medicare PPO

## 2015-11-20 ENCOUNTER — Encounter: Payer: Self-pay | Admitting: Hematology

## 2015-11-20 ENCOUNTER — Ambulatory Visit (HOSPITAL_BASED_OUTPATIENT_CLINIC_OR_DEPARTMENT_OTHER): Payer: Medicare PPO | Admitting: Hematology

## 2015-11-20 VITALS — BP 133/67 | HR 76 | Temp 98.4°F | Resp 17 | Ht 60.0 in | Wt 92.3 lb

## 2015-11-20 DIAGNOSIS — D72820 Lymphocytosis (symptomatic): Secondary | ICD-10-CM

## 2015-11-20 DIAGNOSIS — C9110 Chronic lymphocytic leukemia of B-cell type not having achieved remission: Secondary | ICD-10-CM | POA: Diagnosis not present

## 2015-11-20 LAB — CBC & DIFF AND RETIC
BASO%: 0.2 % (ref 0.0–2.0)
Basophils Absolute: 0 10*3/uL (ref 0.0–0.1)
EOS%: 0.5 % (ref 0.0–7.0)
Eosinophils Absolute: 0.1 10*3/uL (ref 0.0–0.5)
HCT: 37.9 % (ref 34.8–46.6)
HGB: 12.7 g/dL (ref 11.6–15.9)
Immature Retic Fract: 2 % (ref 1.60–10.00)
LYMPH#: 7.7 10*3/uL — AB (ref 0.9–3.3)
LYMPH%: 78.4 % — ABNORMAL HIGH (ref 14.0–49.7)
MCH: 31.1 pg (ref 25.1–34.0)
MCHC: 33.5 g/dL (ref 31.5–36.0)
MCV: 92.7 fL (ref 79.5–101.0)
MONO#: 0 10*3/uL — ABNORMAL LOW (ref 0.1–0.9)
MONO%: 0.2 % (ref 0.0–14.0)
NEUT#: 2 10*3/uL (ref 1.5–6.5)
NEUT%: 20.7 % — ABNORMAL LOW (ref 38.4–76.8)
PLATELETS: 125 10*3/uL — AB (ref 145–400)
RBC: 4.09 10*6/uL (ref 3.70–5.45)
RDW: 13.3 % (ref 11.2–14.5)
RETIC %: 1.4 % (ref 0.70–2.10)
RETIC CT ABS: 57.26 10*3/uL (ref 33.70–90.70)
WBC: 9.8 10*3/uL (ref 3.9–10.3)

## 2015-11-20 LAB — COMPREHENSIVE METABOLIC PANEL
ANION GAP: 8 meq/L (ref 3–11)
AST: 15 U/L (ref 5–34)
Albumin: 3.5 g/dL (ref 3.5–5.0)
Alkaline Phosphatase: 58 U/L (ref 40–150)
BILIRUBIN TOTAL: 0.52 mg/dL (ref 0.20–1.20)
BUN: 21.6 mg/dL (ref 7.0–26.0)
CALCIUM: 9.1 mg/dL (ref 8.4–10.4)
CO2: 27 mEq/L (ref 22–29)
CREATININE: 0.9 mg/dL (ref 0.6–1.1)
Chloride: 105 mEq/L (ref 98–109)
EGFR: 57 mL/min/{1.73_m2} — ABNORMAL LOW (ref >90)
Glucose: 95 mg/dl (ref 70–140)
Potassium: 4.6 mEq/L (ref 3.5–5.1)
Sodium: 139 mEq/L (ref 136–145)
Total Protein: 6.3 g/dL — ABNORMAL LOW (ref 6.4–8.3)

## 2015-11-20 LAB — CHCC SMEAR

## 2015-11-20 LAB — LACTATE DEHYDROGENASE: LDH: 175 U/L (ref 125–245)

## 2015-11-20 NOTE — Progress Notes (Signed)
Marland Kitchen    HEMATOLOGY/ONCOLOGY CONSULTATION NOTE  Date of Service: 11/20/2015  Patient Care Team: [name], MD as PCP - General (Family Medicine)  CHIEF COMPLAINTS/PURPOSE OF CONSULTATION:  Lymphocytosis  HISTORY OF PRESENTING ILLNESS:   Mary Cline is a wonderful 82 y.o. female who has been referred to [place] by Dr [name], MD  for evaluation and management of lymphocytosis.  Patient has a history of hypertension, diabetes, dyslipidemia, chronic systolic CHF who on recent labs with her primary care physician on 10/16/2015 was noted to have leukocytosis with WBC count of 12.6k with a lymphocyte count of 9.5k and normal hemoglobin of 12.6 with an MCV of 93 and mild thrombocytopenia of 139k.   She had a review of her peripheral blood smear by pathology-day showed absolute lymphocytosis and atypical lymphocytes.  Patient was referred to [place] for further evaluation and management of her lymphocytosis. She reports no fevers no chills no night sweats no unintentional weight loss. Borderline enlarged lymph nodes in left upper neck. No abdominal pain or distention.  Patient notes no other acute new symptoms in the last 3-6 months. Labs were done for routine follow-up. Patient is here with her daughter who helps with providing information as well.  Labs done in clinic today showed a CBC with a hemoglobin within normal limits at 12.7, platelet count slightly decreased at 125k, total WBC count of 9.8k with 7.7k lymphocytes.   MEDICAL HISTORY:  Past Medical History:  Diagnosis Date  . Anemia   . Arthritis   . Chronic systolic heart failure ([place]) 04/03/2015  . Diabetes mellitus   . Essential hypertension 04/03/2015  . GERD (gastroesophageal reflux disease)   . Hernia   . Hyperlipidemia 04/03/2015  . Hypertension   . Macular degeneration disease     SURGICAL HISTORY: Past Surgical History:  Procedure Laterality Date  . ANGIOPLASTY    . APPENDECTOMY    . HYSTEROTOMY       SOCIAL HISTORY: Social History   Social History  . Marital status: Widowed    Spouse name: N/A  . Number of children: 4  . Years of education: RN    Occupational History  . Not on file.   Social History Main Topics  . Smoking status: Never Smoker  . Smokeless tobacco: Never Used  . Alcohol use No  . Drug use: No  . Sexual activity: Not on file   Other Topics Concern  . Not on file   Social History Narrative   Patient lives at home with her daughter.    Patient is retired.    Patient is a Education administrator.    Patient was an RN    Patient has 4 children.             Epworth Sleepiness Scale = 10 (as of 04/03/2015)    FAMILY HISTORY: Family History  Problem Relation Age of Onset  . Cancer Sister     Breast cancer  . Diabetes Brother   . Diabetes Maternal Grandmother   . Heart Problems Maternal Grandfather   . Heart Problems Paternal Grandmother   . Heart Problems Paternal Grandfather   . Diabetes Brother   . Diabetes Brother   . Heart Problems Daughter   . Heart Problems Daughter     ALLERGIES:  is allergic to fish allergy; iodine; acth [corticotropin]; actonel [risedronate sodium]; atorvastatin; ciprofloxacin; codeine; demerol [meperidine]; lactose intolerance (gi); lovastatin; macrobid [nitrofurantoin monohyd macro]; other; paxil [paroxetine hcl]; penicillins; sulfa antibiotics; tetracyclines & related;  vioxx [rofecoxib]; vitamin d analogs; and garamycin [gentamicin sulfate].  MEDICATIONS:  Current Outpatient Prescriptions  Medication Sig Dispense Refill  . aspirin 81 MG tablet Take 81 mg by mouth daily.    . carvedilol (COREG) 3.125 MG tablet Take 1 tablet (3.125 mg total) by mouth 2 (two) times daily with a meal. 180 tablet 3  . clopidogrel (PLAVIX) 75 MG tablet Take 1 tablet (75 mg total) by mouth daily. 90 tablet 3  . furosemide (LASIX) 20 MG tablet Take 1 tablet by mouth daily as needed. For weight gain more that 2 pounds in a day or 5 pounds in a week.    Marland Kitchen  glycerin adult 2 G SUPP Place 1 suppository rectally once as needed for moderate constipation.    . Multiple Vitamin (MULTIVITAMIN) tablet Take 1 tablet by mouth daily.    . NONFORMULARY OR COMPOUNDED ITEM Ketoconazole and fluticasone compounded cream.  Apply as needed to affected areas as needed as directed.    Marland Kitchen omeprazole (PRILOSEC) 40 MG capsule Take 40 mg by mouth daily.    . polyethylene glycol (MIRALAX / GLYCOLAX) packet Take 17 g by mouth daily.    . sacubitril-valsartan (ENTRESTO) 49-51 MG Take 1 tablet by mouth 2 (two) times daily. 180 tablet 3  . traMADol (ULTRAM) 50 MG tablet Take 1 tablet (50 mg total) by mouth 3 (three) times daily as needed for severe pain. 20 tablet 0   No current facility-administered medications for this visit.     REVIEW OF SYSTEMS:    10 Point review of Systems was done is negative except as noted above.  PHYSICAL EXAMINATION: ECOG PERFORMANCE STATUS: 2 - Symptomatic, <50% confined to bed  . Vitals:   11/20/15 1402  BP: 133/67  Pulse: 76  Resp: 17  Temp: 98.4 F (36.9 C)   Filed Weights   11/20/15 1402  Weight: 92 lb 4.8 oz (41.9 kg)   .Body mass index is 18.03 kg/m.  GENERAL:alert, in no acute distress and comfortable SKIN: skin color, texture, turgor are normal, no rashes or significant lesions EYES: normal, conjunctiva are pink and non-injected, sclera clear OROPHARYNX:no exudate, no erythema and lips, buccal mucosa, and tongue normal  NECK: supple, no JVD, thyroid normal size, non-tender, without nodularity LYMPH:  no palpable lymphadenopathy in the axillary or inguinal, small left upper cervical lymph node. LUNGS: clear to auscultation with normal respiratory effort HEART: regular rate & rhythm,  no murmurs and no lower extremity edema ABDOMEN: abdomen soft, non-tender, normoactive bowel sounds , no palpable hepatosplenomegaly Musculoskeletal: no cyanosis of digits and no clubbing  PSYCH: alert & oriented x 3 with fluent  speech NEURO: no focal motor/sensory deficits  LABORATORY DATA:  I have reviewed the data as listed  . CBC Latest Ref Rng & Units 10/16/2015 09/16/2011 09/14/2009  WBC 3.8 - 10.8 K/uL 12.6(H) - 4.6  Hemoglobin 11.7 - 15.5 g/dL 12.6 13.6 11.2(L)  Hematocrit 35.0 - 45.0 % 38.7 40.0 32.7(L)  Platelets 140 - 400 K/uL 139(L) - 117(L)   . CBC    Component Value Date/Time   WBC 12.6 (H) 10/16/2015 1011   RBC 4.15 10/16/2015 1011   HGB 12.6 10/16/2015 1011   HCT 38.7 10/16/2015 1011   PLT 139 (L) 10/16/2015 1011   MCV 93.3 10/16/2015 1011   MCH 30.4 10/16/2015 1011   MCHC 32.6 10/16/2015 1011   RDW 14.3 10/16/2015 1011   LYMPHSABS 9,450 (H) 10/16/2015 1011   MONOABS 756 10/16/2015 1011   EOSABS 126  10/16/2015 1011   BASOSABS 0 10/16/2015 1011    . CMP Latest Ref Rng & Units 10/16/2015 09/16/2011 09/14/2009  Glucose 65 - 99 mg/dL 123(H) 129(H) 115(H)  BUN 7 - 25 mg/dL 23 19 13   Creatinine 0.60 - 0.88 mg/dL 1.11(H) 0.80 0.89  Sodium 135 - 146 mmol/L 139 138 141  Potassium 3.5 - 5.3 mmol/L 4.8 4.1 4.0  Chloride 98 - 110 mmol/L 102 100 111  CO2 20 - 31 mmol/L 29 - 27  Calcium 8.6 - 10.4 mg/dL 9.0 - 8.3(L)  Total Protein 6.1 - 8.1 g/dL 6.1 - -  Total Bilirubin 0.2 - 1.2 mg/dL 0.5 - -  Alkaline Phos 33 - 130 U/L 57 - -  AST 10 - 35 U/L 14 - -  ALT 6 - 29 U/L 8 - -      Peripheral blood smear reviewed on 11/20/2015 Normocytic red cells with no increased schistocytes. Increased mature atypical lymphocytosis no increased blasts. No overt platelet clumping noted.   RADIOGRAPHIC STUDIES: I have personally reviewed the radiological images as listed and agreed with the findings in the report. No results found.  ASSESSMENT & PLAN:   82 year old Caucasian female with  #1 Absolute Lymphocytosis - lymphocyte count of 9.5k noted on routine labs done on 10/16/2015 with her primary care physician. Patient has no constitutional symptoms. No significant cytopenias with normal hemoglobin and  minimal thrombocytopenia. LDH level is within normal limits. Lymphocyte count is down a little bit today labs to 7.7k After discussion of pros and cons of additional testing the patient was okay with doing further analysis to determine the etiology of her lymphocytosis. The flow cytometry was done which is consistent with a diagnosis of chronic lymphocytic leukemia. LDH level within normal limits  #2 Rai Stage 1 CLL -- currently no indications for treatment. PLAN -She'll call the patient/her daughter Alvester Morin to discuss the results of the workup and a new diagnosis of chronic lymphocytic leukemia.  -We had discussed the strong possibility of this being present and the indications for treatment. -Patient currently has no constitutional symptoms no significant anemia or thrombocytopenia no significant lymphadenopathy or symptomatic hepatosplenomegaly at this time. No clear indication for treatment of the patient's CLL at this time. -We shall follow her lymphocyte counts and other labs in 4 months to determine the tempo of her disease. -She will continue to follow with her primary care physician for management of her other multiple medical comorbidities. -We will consider getting a CLL prognostic fish panel on her next visit.  RTC with Dr. Irene Limbo in 1 month's, CMP, LDH and CLL fish prognostic panel   All of the patients and her daughters questions were answered with apparent satisfaction. The patient knows to call the clinic with any problems, questions or concerns.  I spent 55 minutes counseling the patient face to face. The total time spent in the appointment was 60 minutes and more than 50% was on counseling and direct patient cares.    Sullivan Lone MD [place]a AAHIVMS [facility] Hematology/Oncology Physician [facility]  (Office):       [phone] (Work cell):  [phone] (Fax):           [phone]  11/20/2015 2:26 PM

## 2015-11-21 DIAGNOSIS — C9190 Lymphoid leukemia, unspecified not having achieved remission: Secondary | ICD-10-CM | POA: Diagnosis not present

## 2015-11-23 ENCOUNTER — Other Ambulatory Visit: Payer: Self-pay

## 2015-11-23 NOTE — Telephone Encounter (Signed)
Pharmacy is requesting refill on Diovan. Med was discontinued last ov by Dr Oval Linsey. Form faxed back to pharmacy.

## 2015-11-26 ENCOUNTER — Ambulatory Visit (INDEPENDENT_AMBULATORY_CARE_PROVIDER_SITE_OTHER): Payer: Medicare PPO | Admitting: Cardiovascular Disease

## 2015-11-26 ENCOUNTER — Encounter: Payer: Self-pay | Admitting: Cardiovascular Disease

## 2015-11-26 VITALS — BP 124/74 | HR 76 | Ht 60.0 in | Wt 92.8 lb

## 2015-11-26 DIAGNOSIS — I5022 Chronic systolic (congestive) heart failure: Secondary | ICD-10-CM

## 2015-11-26 DIAGNOSIS — E785 Hyperlipidemia, unspecified: Secondary | ICD-10-CM

## 2015-11-26 DIAGNOSIS — R0602 Shortness of breath: Secondary | ICD-10-CM

## 2015-11-26 DIAGNOSIS — I2510 Atherosclerotic heart disease of native coronary artery without angina pectoris: Secondary | ICD-10-CM

## 2015-11-26 DIAGNOSIS — I10 Essential (primary) hypertension: Secondary | ICD-10-CM | POA: Diagnosis not present

## 2015-11-26 LAB — FLOW CYTOMETRY

## 2015-11-26 NOTE — Progress Notes (Signed)
Cardiology Office Note   Date:  11/26/2015   ID:  Mary Mary Cline, DOB 17-Dec-1922, MRN [id number]  PCP:  Mary Nip, MD  Cardiologist:   Mary Latch, MD   Chief Complaint  Mary Cline presents with  . Follow-up    cramping in feet no other Sx.   History of Present Illness: Mary Mary Cline is a 82 y.o. female with hypertension, diabetes, GERD, giant cell arteritis, chronic systolic heart failure LVEF 15-20%, and LBBB here for follow up.  Mary Mary Cline was seen by Dr. Julaine Cline (cardiolgist, [facility]) on 11/2014.  At that time she was following up after a hospitalization for systolic heart failure that was likely ishcemic.  She was initially hospitalized for shortness of breath and was found to have an NSTEMI.  Troponin peaked at 0.17.  BNP was 33,000. EKG revealed LBBB and no prior EKGs were available for comparison.  Her symptoms improved with diuresis.   Her family decided not to pursue aggressive measures given her age.  Her only complaint at follow up with Dr. Posey Mary Cline was frequent urination due to lasix.  At that appointment she was noted to be hypotensive.  She was taking both metoprolol and carvedilol, so metoprolol was discontinue.  Her lasix was reduced to MWF only.  At her last appointment lasix was reduced to daily as needed.  She also noted fatigue so  thyroid function was tested, which was normal.  Overall Mary Mary Cline has been doing well. Her weight remains stable at around 90-92 pounds. She continues to walk up and down her driveway several times each day and denies chest pain or shortness of breath with this activity. She has not noted any lower extremity edema, orthopnea or PND.  However she continues to sleep with her head elevated for comfort.  Mary Mary Cline has been monitoring her blood pressure at home and it has ranged from 98-146/60-60s. At her last appointment lisinopril was switched to Mary Mary Cline.  At first she noted an increase in her frequency of  urination.  However, this has subsided.  She was recently diagnosed with CLL but does not require any treatment at this time.   Past Medical History:  Diagnosis Date  . Anemia   . Arthritis   . Chronic systolic heart failure ([place]) 04/03/2015  . Diabetes mellitus   . Essential hypertension 04/03/2015  . GERD (gastroesophageal reflux disease)   . Hernia   . Hyperlipidemia 04/03/2015  . Hypertension   . Macular degeneration disease     Past Surgical History:  Procedure Laterality Date  . ANGIOPLASTY    . APPENDECTOMY    . HYSTEROTOMY       Current Outpatient Prescriptions  Medication Sig Dispense Refill  . aspirin 81 MG tablet Take 81 mg by mouth daily.    . carvedilol (COREG) 3.125 MG tablet Take 1 tablet (3.125 mg total) by mouth 2 (two) times daily with a meal. 180 tablet 3  . clopidogrel (PLAVIX) 75 MG tablet Take 1 tablet (75 mg total) by mouth daily. 90 tablet 3  . furosemide (LASIX) 20 MG tablet Take 1 tablet by mouth as needed. For weight gain more that 2 pounds in a day or 5 pounds in a week.    Marland Kitchen glycerin adult 2 G SUPP Place 1 suppository rectally once as needed for moderate constipation.    . Multiple Vitamin (MULTIVITAMIN) tablet Take 1 tablet by mouth daily.    . NONFORMULARY OR COMPOUNDED ITEM Ketoconazole and fluticasone compounded  cream.  Apply as needed to affected areas as needed as directed.    Marland Kitchen omeprazole (PRILOSEC) 40 MG capsule Take 40 mg by mouth daily.    . polyethylene glycol (MIRALAX / GLYCOLAX) packet Take 17 g by mouth daily.    . sacubitril-valsartan (ENTRESTO) 49-51 MG Take 1 tablet by mouth 2 (two) times daily. 180 tablet 3  . traMADol (ULTRAM) 50 MG tablet Take 1 tablet (50 mg total) by mouth 3 (three) times daily as needed for severe pain. 20 tablet 0   No current facility-administered medications for this visit.     Allergies:   Fish allergy; Iodine; Acth [corticotropin]; Actonel [risedronate sodium]; Atorvastatin; Ciprofloxacin; Codeine; Demerol  [meperidine]; Lactose intolerance (gi); Lovastatin; Macrobid [nitrofurantoin monohyd macro]; Other; Paxil [paroxetine hcl]; Penicillins; Sulfa antibiotics; Tetracyclines & related; Vioxx [rofecoxib]; Vitamin d analogs; and Garamycin [gentamicin sulfate]    Social History:  Mary Mary Cline  reports that she has never smoked. She has never used smokeless tobacco. She reports that she does not drink alcohol or use drugs.   Family History:  Mary Mary Cline's family history includes Cancer in her sister; Diabetes in her brother, brother, brother, and maternal grandmother; Heart Problems in her daughter, daughter, maternal grandfather, paternal grandfather, and paternal grandmother.    ROS:  Please see Mary history of present illness.   Otherwise, review of systems are positive for leg pain at night, reduced eye sight and hearing, memory deficits, feeling cold.   All other systems are reviewed and negative.    PHYSICAL EXAM: VS:  BP 124/74   Pulse 76   Ht 5' (1.524 m)   Wt 92 lb 12.8 oz (42.1 kg)   BMI 18.12 kg/m  , BMI Body mass index is 18.12 kg/m. GENERAL:  Frail, elderly woman in no acute distress. HEENT:  Pupils equal round and reactive, fundi not visualized, oral mucosa unremarkable NECK:  No jugular venous distention, waveform within normal limits, carotid upstroke brisk and symmetric, no bruits, no thyromegaly LYMPHATICS:  No cervical adenopathy LUNGS:  Diffuse expiratory wheezing. No crackles, or rhonchi. HEART:  RRR.  PMI not displaced or sustained,S1 and S2 within normal limits, S3 present. No S4, no clicks, no rubs, no murmurs ABD:  Flat, positive bowel sounds normal in frequency in pitch, no bruits, no rebound, no guarding, no midline pulsatile mass, no hepatomegaly, no splenomegaly.  +epigastric TTP.  EXT:  2 plus pulses throughout, no edema, no cyanosis no clubbing SKIN:  No rashes no nodules NEURO:  Cranial nerves II through XII grossly intact, motor grossly intact throughout PSYCH:   Cognitively intact, oriented to person place and time  EKG:  EKG is ordered today. 11/26/15: Sinus rhythm.  Rate 76 bpm.  LBBB. Mary ekg ordered 04/03/15 demonstrates sinus rhythm.  LBBB.   Echo 12/18/14: LVEF 0000000  Grade 3 diastolic dysfunction.  Mod-severely dilated LA.  RA mildly dilated.  Mild aortic valve sclerosis without stenosis.  Moderate mitral valve thickening.  Mild TR.    Recent Labs: 04/03/2015: TSH 2.327 10/16/2015: Brain Natriuretic Peptide 686.1 11/20/2015: ALT <9; BUN 21.6; Creatinine 0.9; HGB 12.7; Platelets 125; Potassium 4.6; Sodium 139    Lipid Panel    Component Value Date/Time   CHOL  09/13/2009 0620    157        ATP III CLASSIFICATION:  <200     mg/dL   Desirable  200-239  mg/dL   Borderline High  >=240    mg/dL   High  TRIG 112 09/13/2009 0620   HDL 41 09/13/2009 0620   CHOLHDL 3.8 09/13/2009 0620   VLDL 22 09/13/2009 0620   LDLCALC  09/13/2009 0620    94        Total Cholesterol/HDL:CHD Risk Coronary Heart Disease Risk Table                     Men   Women  1/2 Average Risk   3.4   3.3  Average Risk       5.0   4.4  2 X Average Risk   9.6   7.1  3 X Average Risk  23.4   11.0        Use Mary calculated Mary Cline Ratio above and Mary CHD Risk Table to determine Mary Mary Cline's CHD Risk.        ATP III CLASSIFICATION (LDL):  <100     mg/dL   Optimal  100-129  mg/dL   Near or Above                    Optimal  130-159  mg/dL   Borderline  160-189  mg/dL   High  >190     mg/dL   Very High      Wt Readings from Last 3 Encounters:  11/26/15 92 lb 12.8 oz (42.1 kg)  11/20/15 92 lb 4.8 oz (41.9 kg)  10/16/15 90 lb 9.6 oz (41.1 kg)      ASSESSMENT AND PLAN:  # Chronic systolic heart failure: Mary Mary Cline is doing well and thinks that her shortness of breath is better on Entresto.  She is not requiring any lasix at this time.  Continue carvedilol, Entresto and prn lasix.  # Hypertension: BP well-controlled.  Continue carvedilol and  Entresto.  # NSTEMI: Mary Mary Cline had a mild NSTEMI 11/2014.  Will discuss stopping clopidogrel at follow up.  Continue aspirin and carvedilol.  Current medicines are reviewed at length with Mary Mary Cline today.  Mary Mary Cline does not have concerns regarding medicines.  Mary following changes have been made:  Change lasix to as needed dosing.  Labs/ tests ordered today include:   No orders of Mary defined types were placed in this encounter.    Disposition:   FU with Yareliz Thorstenson C. Oval Linsey, MD, [facility] in 4 months.    This note was written with Mary assistance of speech recognition software.  Please excuse any transcriptional errors.  Signed, Henderson Frampton C. Oval Linsey, MD, [facility]  11/26/2015 11:02 AM    [place]

## 2015-11-26 NOTE — Patient Instructions (Signed)
Medication Instructions:  Your physician recommends that you continue on your current medications as directed. Please refer to the Current Medication list given to you today.  Labwork: none  Testing/Procedures: none  Follow-Up: Your physician recommends that you schedule a follow-up appointment in: 4 month ov   If you need a refill on your cardiac medications before your next appointment, please call your pharmacy.  

## 2015-12-24 ENCOUNTER — Ambulatory Visit: Payer: Medicare PPO | Admitting: Sports Medicine

## 2015-12-24 DIAGNOSIS — K5900 Constipation, unspecified: Secondary | ICD-10-CM | POA: Diagnosis not present

## 2015-12-25 ENCOUNTER — Encounter: Payer: Self-pay | Admitting: Sports Medicine

## 2015-12-25 ENCOUNTER — Ambulatory Visit (INDEPENDENT_AMBULATORY_CARE_PROVIDER_SITE_OTHER): Payer: Medicare PPO | Admitting: Sports Medicine

## 2015-12-25 DIAGNOSIS — M79673 Pain in unspecified foot: Secondary | ICD-10-CM | POA: Diagnosis not present

## 2015-12-25 DIAGNOSIS — B351 Tinea unguium: Secondary | ICD-10-CM | POA: Diagnosis not present

## 2015-12-25 DIAGNOSIS — Z7901 Long term (current) use of anticoagulants: Secondary | ICD-10-CM

## 2015-12-25 DIAGNOSIS — R7303 Prediabetes: Secondary | ICD-10-CM

## 2015-12-25 DIAGNOSIS — M21619 Bunion of unspecified foot: Secondary | ICD-10-CM

## 2015-12-25 NOTE — Progress Notes (Signed)
Patient ID: Mary Cline, female   DOB: Aug 09, 1922, 82 y.o.   MRN: [id number]  Subjective: Mary Cline is a 82 y.o. female patient seen today in office with complaint of painful thickened and elongated toenails; unable to trim. Patient denies any changes since last visit with medical problems. Patient has no other pedal complaints at this time.   Patient Active Problem List   Diagnosis Date Noted  . Chronic systolic heart failure ([place]) 04/03/2015  . Essential hypertension 04/03/2015  . Hyperlipidemia 04/03/2015  . Retinal vascular occlusion, unspecified 11/10/2012  . Giant cell arteritis ([place]) 11/10/2012  . Headache(784.0) 11/08/2012  . Visual changes 11/08/2012    Current Outpatient Prescriptions on File Prior to Visit  Medication Sig Dispense Refill  . aspirin 81 MG tablet Take 81 mg by mouth daily.    . carvedilol (COREG) 3.125 MG tablet Take 1 tablet (3.125 mg total) by mouth 2 (two) times daily with a meal. 180 tablet 3  . clopidogrel (PLAVIX) 75 MG tablet Take 1 tablet (75 mg total) by mouth daily. 90 tablet 3  . furosemide (LASIX) 20 MG tablet Take 1 tablet by mouth as needed. For weight gain more that 2 pounds in a day or 5 pounds in a week.    Marland Kitchen glycerin adult 2 G SUPP Place 1 suppository rectally once as needed for moderate constipation.    . Multiple Vitamin (MULTIVITAMIN) tablet Take 1 tablet by mouth daily.    . NONFORMULARY OR COMPOUNDED ITEM Ketoconazole and fluticasone compounded cream.  Apply as needed to affected areas as needed as directed.    Marland Kitchen omeprazole (PRILOSEC) 40 MG capsule Take 40 mg by mouth daily.    . polyethylene glycol (MIRALAX / GLYCOLAX) packet Take 17 g by mouth daily.    . sacubitril-valsartan (ENTRESTO) 49-51 MG Take 1 tablet by mouth 2 (two) times daily. 180 tablet 3  . traMADol (ULTRAM) 50 MG tablet Take 1 tablet (50 mg total) by mouth 3 (three) times daily as needed for severe pain. 20 tablet 0   No current facility-administered  medications on file prior to visit.     Allergies  Allergen Reactions  . Fish Allergy Anaphylaxis  . Iodine Anaphylaxis  . Acth [Corticotropin] Other (See Comments)    Confusion   . Actonel [Risedronate Sodium] Nausea And Vomiting  . Atorvastatin     Blisters in mouth.    . Ciprofloxacin Nausea And Vomiting  . Codeine Other (See Comments)    Headache   . Demerol [Meperidine] Other (See Comments)    Hypotension   . Lactose Intolerance (Gi)     Intolerance.    . Lovastatin     blisters  . Macrobid [Nitrofurantoin Monohyd Macro] Nausea And Vomiting  . Other Hives and Swelling    Melons and strawberries.    . Paxil [Paroxetine Hcl] Nausea Only  . Penicillins     Pain.    . Sulfa Antibiotics Swelling  . Tetracyclines & Related     Stomach pain  . Vioxx [Rofecoxib]   . Vitamin D Analogs Other (See Comments)    Dizzy confused  . Garamycin [Gentamicin Sulfate] Rash    Objective: Physical Exam  General: Well developed, nourished, no acute distress, awake, alert and oriented x 3  Vascular: Dorsalis pedis artery 1/4 bilateral, Posterior tibial artery 1/4 bilateral, skin temperature warm to warm proximal to distal bilateral lower extremities, mild varicosities, decreased pedal hair present bilateral.  Neurological: Gross sensation present via light touch bilateral.  Dermatological: Skin is warm, dry, and supple bilateral, Nails 1-10 are tender, long, thick, and discolored with mild subungal debris, no webspace macerations present bilateral, no open lesions present bilateral, no callus/corns/hyperkeratotic tissue present bilateral. No signs of infection bilateral.  Musculoskeletal: Bunion deformities noted bilateral and lesser hammertoe that is asymptomatic. Muscular strength within normal limits without pain on range of motion. No pain with calf compression bilateral.  Assessment and Plan:  Problem List Items Addressed This Visit    None    Visit Diagnoses     Dermatophytosis of nail    -  Primary   Foot pain, unspecified laterality       Bunion       Long term (current) use of anticoagulants       Pre-diabetes         -Examined patient.  -Discussed treatment options for painful mycotic nails. -Mechanically debrided and reduced mycotic nails with sterile nail nipper and dremel nail file without incident. -Continue with toe spacer to prevent irritation at bunion onto 2nd toe -Patient to return in 3 months/as needed for follow up evaluation or sooner if symptoms worsen.  [name], DPM

## 2016-01-07 DIAGNOSIS — Z23 Encounter for immunization: Secondary | ICD-10-CM | POA: Diagnosis not present

## 2016-01-07 DIAGNOSIS — M720 Palmar fascial fibromatosis [Dupuytren]: Secondary | ICD-10-CM | POA: Diagnosis not present

## 2016-01-07 DIAGNOSIS — R350 Frequency of micturition: Secondary | ICD-10-CM | POA: Diagnosis not present

## 2016-01-07 DIAGNOSIS — H6121 Impacted cerumen, right ear: Secondary | ICD-10-CM | POA: Diagnosis not present

## 2016-03-04 ENCOUNTER — Other Ambulatory Visit: Payer: Self-pay | Admitting: Cardiovascular Disease

## 2016-03-11 ENCOUNTER — Other Ambulatory Visit: Payer: Self-pay | Admitting: *Deleted

## 2016-03-12 ENCOUNTER — Other Ambulatory Visit: Payer: Self-pay

## 2016-03-12 MED ORDER — SACUBITRIL-VALSARTAN 49-51 MG PO TABS: 1.0000 | ORAL_TABLET | Freq: Two times a day (BID) | ORAL | 3 refills | Status: DC

## 2016-03-17 ENCOUNTER — Encounter: Payer: Self-pay | Admitting: Cardiovascular Disease

## 2016-03-17 ENCOUNTER — Ambulatory Visit (INDEPENDENT_AMBULATORY_CARE_PROVIDER_SITE_OTHER): Payer: Medicare PPO | Admitting: Cardiovascular Disease

## 2016-03-17 ENCOUNTER — Encounter (INDEPENDENT_AMBULATORY_CARE_PROVIDER_SITE_OTHER): Payer: Self-pay

## 2016-03-17 VITALS — BP 120/70 | HR 68 | Ht 60.0 in | Wt 93.4 lb

## 2016-03-17 DIAGNOSIS — E7800 Pure hypercholesterolemia, unspecified: Secondary | ICD-10-CM | POA: Diagnosis not present

## 2016-03-17 DIAGNOSIS — I5042 Chronic combined systolic (congestive) and diastolic (congestive) heart failure: Secondary | ICD-10-CM

## 2016-03-17 DIAGNOSIS — I2510 Atherosclerotic heart disease of native coronary artery without angina pectoris: Secondary | ICD-10-CM | POA: Diagnosis not present

## 2016-03-17 DIAGNOSIS — I110 Hypertensive heart disease with heart failure: Secondary | ICD-10-CM

## 2016-03-17 MED ORDER — SACUBITRIL-VALSARTAN 49-51 MG PO TABS: 1.0000 | ORAL_TABLET | Freq: Two times a day (BID) | ORAL | 3 refills | Status: DC

## 2016-03-17 MED ORDER — CLOPIDOGREL BISULFATE 75 MG PO TABS: 75.0000 mg | ORAL_TABLET | Freq: Every day | ORAL | 3 refills | Status: DC

## 2016-03-17 MED ORDER — CARVEDILOL 3.125 MG PO TABS: 3.1250 mg | ORAL_TABLET | Freq: Two times a day (BID) | ORAL | 3 refills | Status: DC

## 2016-03-17 NOTE — Progress Notes (Signed)
Cardiology Office Note   Date:  03/17/2016   ID:  Mary Cline, DOB Nov 11, 1922, MRN [id number]  PCP:  Mary Nip, MD  Cardiologist:   Skeet Latch, MD   Chief Complaint  Patient presents with  . Follow-up   History of Present Illness: Mary Cline is a 82 y.o. female with hypertension, diabetes, GERD, giant cell arteritis, CLL, chronic systolic heart failure LVEF 15-20%, and LBBB here for follow up.  Mary Cline was seen by Dr. Julaine Cline (cardiolgist, [facility]) on 11/2014.  At that time she was following up after a hospitalization for systolic heart failure that was likely ischemic.  She was initially hospitalized for shortness of breath and was found to have an NSTEMI.  Troponin peaked at 0.17.  BNP was 33,000. EKG revealed LBBB and no prior EKGs were available for comparison.  Her symptoms improved with diuresis.   Her family decided not to pursue aggressive measures given her age.    Since her last appointment Ms. Mary Cline has been doing well.  Her main complaint is frequent nocturnal urination.  She is no longer takings lasix and is doing well on Entresto. She denies lower extremity edema, orthopnea or PND.  She fell in her garage two weeks ago.  She wasn't using her walking and was trying to sweep up leaves.  She was unable to get up but did have an emergency relief necklace.  She brings a log of her BP at home that demonstrates that it has been well-controlled.    Past Medical History:  Diagnosis Date  . Anemia   . Arthritis   . Chronic systolic heart failure ([place]) 04/03/2015  . Diabetes mellitus   . Essential hypertension 04/03/2015  . GERD (gastroesophageal reflux disease)   . Hernia   . Hyperlipidemia 04/03/2015  . Hypertension   . Macular degeneration disease     Past Surgical History:  Procedure Laterality Date  . ANGIOPLASTY    . APPENDECTOMY    . HYSTEROTOMY       Current Outpatient Prescriptions  Medication Sig Dispense  Refill  . aspirin 81 MG tablet Take 81 mg by mouth daily.    . carvedilol (COREG) 3.125 MG tablet Take 1 tablet (3.125 mg total) by mouth 2 (two) times daily with a meal. 180 tablet 3  . clopidogrel (PLAVIX) 75 MG tablet Take 1 tablet (75 mg total) by mouth daily. 90 tablet 3  . furosemide (LASIX) 20 MG tablet Take 1 tablet by mouth as needed. For weight gain more that 2 pounds in a day or 5 pounds in a week.    Marland Kitchen glycerin adult 2 G SUPP Place 1 suppository rectally once as needed for moderate constipation.    . Multiple Vitamin (MULTIVITAMIN) tablet Take 1 tablet by mouth daily.    . NONFORMULARY OR COMPOUNDED ITEM Ketoconazole and fluticasone compounded cream.  Apply as needed to affected areas as needed as directed.    Marland Kitchen omeprazole (PRILOSEC) 40 MG capsule Take 40 mg by mouth daily.    . sacubitril-valsartan (ENTRESTO) 49-51 MG Take 1 tablet by mouth 2 (two) times daily. 180 tablet 3  . traMADol (ULTRAM) 50 MG tablet Take 1 tablet (50 mg total) by mouth 3 (three) times daily as needed for severe pain. 20 tablet 0  . Wheat Dextrin (BENEFIBER) POWD Take 2 scoop by mouth daily.     No current facility-administered medications for this visit.     Allergies:   Fish allergy;  Iodine; Acth [corticotropin]; Actonel [risedronate sodium]; Atorvastatin; Ciprofloxacin; Codeine; Demerol [meperidine]; Garamycin [gentamicin sulfate]; Lactose intolerance (gi); Lovastatin; Macrobid [nitrofurantoin monohyd macro]; Other; Paxil [paroxetine hcl]; Penicillins; Sulfa antibiotics; Tetracyclines & related; Vioxx [rofecoxib]; and Vitamin d analogs    Social History:  The patient  reports that she has never smoked. She has never used smokeless tobacco. She reports that she does not drink alcohol or use drugs.   Family History:  The patient's family history includes Cancer in her sister; Diabetes in her brother, brother, brother, and maternal grandmother; Heart Problems in her daughter, daughter, maternal grandfather,  paternal grandfather, and paternal grandmother.    ROS:  Please see the history of present illness.   Otherwise, review of systems are positive for leg pain at night, reduced eye sight and hearing, memory deficits, feeling cold.   All other systems are reviewed and negative.    PHYSICAL EXAM: VS:  BP 120/70 (BP Location: Right Arm, Patient Position: Sitting, Cuff Size: Normal)   Pulse 68   Ht 5' (1.524 m)   Wt 42.4 kg (93 lb 6.4 oz)   SpO2 96%   BMI 18.24 kg/m  , BMI Body mass index is 18.24 kg/m. GENERAL:  Frail, elderly woman in no acute distress. HEENT:  Pupils equal round and reactive, fundi not visualized, oral mucosa unremarkable NECK:  No jugular venous distention, waveform within normal limits, carotid upstroke brisk and symmetric, no bruits LYMPHATICS:  No cervical adenopathy LUNGS:  Diffuse expiratory wheezing. No crackles, or rhonchi. HEART:  RRR.  PMI not displaced or sustained,S1 and S2 within normal limits, S3 present. No S4, no clicks, no rubs, no murmurs ABD:  Flat, positive bowel sounds normal in frequency in pitch, no bruits, no rebound, no guarding, no midline pulsatile mass, no hepatomegaly, no splenomegaly.  +epigastric TTP.  EXT:  2 plus pulses throughout, no edema, no cyanosis no clubbing SKIN:  No rashes no nodules NEURO:  Cranial nerves II through XII grossly intact, motor grossly intact throughout PSYCH:  Cognitively intact, oriented to person place and time  EKG:  EKG is not ordered today. 11/23/15: Sinus rhythm.  Rate 76 bpm.  LBBB. The ekg ordered 04/03/15 demonstrates sinus rhythm.  LBBB.   Echo 12/18/14: LVEF 0000000  Grade 3 diastolic dysfunction.  Mod-severely dilated LA.  RA mildly dilated.  Mild aortic valve sclerosis without stenosis.  Moderate mitral valve thickening.  Mild TR.    Recent Labs: 04/03/2015: TSH 2.327 10/16/2015: Brain Natriuretic Peptide 686.1 11/20/2015: ALT <9; BUN 21.6; Creatinine 0.9; HGB 12.7; Platelets 125; Potassium 4.6; Sodium 139     Lipid Panel    Component Value Date/Time   CHOL  09/13/2009 0620    157        ATP III CLASSIFICATION:  <200     mg/dL   Desirable  200-239  mg/dL   Borderline High  >=240    mg/dL   High          TRIG 112 09/13/2009 0620   HDL 41 09/13/2009 0620   CHOLHDL 3.8 09/13/2009 0620   VLDL 22 09/13/2009 0620   LDLCALC  09/13/2009 0620    94        Total Cholesterol/HDL:CHD Risk Coronary Heart Disease Risk Table                     Men   Women  1/2 Average Risk   3.4   3.3  Average Risk       5.0  4.4  2 X Average Risk   9.6   7.1  3 X Average Risk  23.4   11.0        Use the calculated Patient Ratio above and the CHD Risk Table to determine the patient's CHD Risk.        ATP III CLASSIFICATION (LDL):  <100     mg/dL   Optimal  100-129  mg/dL   Near or Above                    Optimal  130-159  mg/dL   Borderline  160-189  mg/dL   High  >190     mg/dL   Very High      Wt Readings from Last 3 Encounters:  03/17/16 42.4 kg (93 lb 6.4 oz)  11/26/15 42.1 kg (92 lb 12.8 oz)  11/20/15 41.9 kg (92 lb 4.8 oz)      ASSESSMENT AND PLAN:  # Chronic systolic heart failure: Ms. Hesseltine is doing well and is euvolemic. Continue carvedilol, Entresto and prn lasix.  # Hypertension: BP well-controlled.  Continue carvedilol and Entresto.  # CAD s/p NSTEMI: Ms. Nebel had a mild NSTEMI 11/2014 that has been medically managed.  Will discuss the fact that carvedilol could be stopped at this time.  However, given that she was not revascularized and her severely reduced systolic function, we will continue aspirin, clopidogrel, and carvedilol.  She hasn't tolerated statins in the past and her LDL was 94 when checked in 2011.  We will repeat fasting lipids and CMP at follow up.    Current medicines are reviewed at length with the patient today.  The patient does not have concerns regarding medicines.  The following changes have been made:  none  Labs/ tests ordered today include:    No orders of the defined types were placed in this encounter.    Disposition:   FU with Pinky Ravan C. Oval Linsey, MD, [facility] in 4 months.    This note was written with the assistance of speech recognition software.  Please excuse any transcriptional errors.  Signed, Anisha Starliper C. Oval Linsey, MD, [facility]  03/17/2016 10:17 AM    [place]

## 2016-03-17 NOTE — Patient Instructions (Signed)
Medication Instructions:  Your physician recommends that you continue on your current medications as directed. Please refer to the Current Medication list given to you today.  Labwork: NONE  Testing/Procedures: NONE  Follow-Up: Your physician wants you to follow-up in: 4 MONTH OV You will receive a reminder letter in the mail two months in advance. If you don't receive a letter, please call our office to schedule the follow-up appointment.  If you need a refill on your cardiac medications before your next appointment, please call your pharmacy.  

## 2016-03-18 ENCOUNTER — Encounter: Payer: Self-pay | Admitting: Sports Medicine

## 2016-03-18 ENCOUNTER — Ambulatory Visit (INDEPENDENT_AMBULATORY_CARE_PROVIDER_SITE_OTHER): Payer: Medicare PPO | Admitting: Sports Medicine

## 2016-03-18 DIAGNOSIS — M79674 Pain in right toe(s): Secondary | ICD-10-CM

## 2016-03-18 DIAGNOSIS — B351 Tinea unguium: Secondary | ICD-10-CM

## 2016-03-18 DIAGNOSIS — M21619 Bunion of unspecified foot: Secondary | ICD-10-CM

## 2016-03-18 DIAGNOSIS — M79675 Pain in left toe(s): Secondary | ICD-10-CM

## 2016-03-18 DIAGNOSIS — R7303 Prediabetes: Secondary | ICD-10-CM

## 2016-03-18 NOTE — Progress Notes (Signed)
Patient ID: Mary Cline, female   DOB: 05-Jul-1922, 82 y.o.   MRN: [id number]  Subjective: Mary Cline is a 82 y.o. female patient seen today in office with complaint of painful thickened and elongated toenails; unable to trim. Patient denies any changes since last visit with medical problems. Admits that she fail 2 weeks ago and has a bruising to her bottom also states that at night she gets tingling to her toes. Patient has no other pedal complaints at this time.   Patient Active Problem List   Diagnosis Date Noted  . Chronic systolic heart failure ([place]) 04/03/2015  . Essential hypertension 04/03/2015  . Hyperlipidemia 04/03/2015  . Retinal vascular occlusion, unspecified 11/10/2012  . Giant cell arteritis ([place]) 11/10/2012  . Headache(784.0) 11/08/2012  . Visual changes 11/08/2012    Current Outpatient Prescriptions on File Prior to Visit  Medication Sig Dispense Refill  . aspirin 81 MG tablet Take 81 mg by mouth daily.    . carvedilol (COREG) 3.125 MG tablet Take 1 tablet (3.125 mg total) by mouth 2 (two) times daily with a meal. 180 tablet 3  . clopidogrel (PLAVIX) 75 MG tablet Take 1 tablet (75 mg total) by mouth daily. 90 tablet 3  . furosemide (LASIX) 20 MG tablet Take 1 tablet by mouth as needed. For weight gain more that 2 pounds in a day or 5 pounds in a week.    Marland Kitchen glycerin adult 2 G SUPP Place 1 suppository rectally once as needed for moderate constipation.    . Multiple Vitamin (MULTIVITAMIN) tablet Take 1 tablet by mouth daily.    . NONFORMULARY OR COMPOUNDED ITEM Ketoconazole and fluticasone compounded cream.  Apply as needed to affected areas as needed as directed.    Marland Kitchen omeprazole (PRILOSEC) 40 MG capsule Take 40 mg by mouth daily.    . sacubitril-valsartan (ENTRESTO) 49-51 MG Take 1 tablet by mouth 2 (two) times daily. 180 tablet 3  . traMADol (ULTRAM) 50 MG tablet Take 1 tablet (50 mg total) by mouth 3 (three) times daily as needed for severe pain. 20 tablet 0   . Wheat Dextrin (BENEFIBER) POWD Take 2 scoop by mouth daily.     No current facility-administered medications on file prior to visit.     Allergies  Allergen Reactions  . Fish Allergy Anaphylaxis  . Iodine Anaphylaxis  . Acth [Corticotropin] Other (See Comments)    Confusion   . Actonel [Risedronate Sodium] Nausea And Vomiting  . Atorvastatin Other (See Comments)    Blisters in mouth.    . Ciprofloxacin Nausea And Vomiting  . Codeine Other (See Comments)    Headache   . Demerol [Meperidine] Other (See Comments)    Hypotension   . Garamycin [Gentamicin Sulfate] Rash  . Lactose Intolerance (Gi) Other (See Comments)    Intolerance.    . Lovastatin Other (See Comments)    blisters  . Macrobid [Nitrofurantoin Monohyd Macro] Nausea And Vomiting  . Other Hives and Swelling    Melons and strawberries.    . Paxil [Paroxetine Hcl] Nausea Only  . Penicillins Other (See Comments)    Pain.    . Sulfa Antibiotics Swelling  . Tetracyclines & Related Other (See Comments)    Stomach pain  . Vioxx [Rofecoxib] Other (See Comments)    Not known  . Vitamin D Analogs Other (See Comments)    Dizzy confused    Objective: Physical Exam  General: Well developed, nourished, no acute distress, awake, alert and oriented x  3  Vascular: Dorsalis pedis artery 1/4 bilateral, Posterior tibial artery 1/4 bilateral, skin temperature warm to warm proximal to distal bilateral lower extremities, mild varicosities, decreased pedal hair present bilateral.  Neurological: Gross sensation present via light touch bilateral. Subjective tingling to toes, likely positional neuropathy  Dermatological: Skin is warm, dry, and supple bilateral, Nails 1-10 are tender, long, thick, and discolored with mild subungal debris, no webspace macerations present bilateral, no open lesions present bilateral, no callus/corns/hyperkeratotic tissue present bilateral. No signs of infection bilateral.  Musculoskeletal: Bunion  deformities noted bilateral and lesser hammertoe that is asymptomatic. Muscular strength within normal limits without pain on range of motion. No pain with calf compression bilateral.  Assessment and Plan:  Problem List Items Addressed This Visit    None    Visit Diagnoses    Dermatophytosis of nail    -  Primary   Bunion       Pre-diabetes       Toe pain, bilateral         -Examined patient.  -Discussed treatment options for painful mycotic nails. -Mechanically debrided and reduced mycotic nails with sterile nail nipper and dremel nail file without incident. -Continue with toe spacer to prevent irritation at bunion onto 2nd toe -Recommend patient to get over-the-counter capsaicin cream to use as needed for tingling in toes. Just prior to bedtime. Also encouraged patient to use cushions and pillows for likely possible positional neuropathy -Patient to return in 2.5 months/as needed for follow up evaluation or sooner if symptoms worsen.  [name], DPM

## 2016-04-01 ENCOUNTER — Encounter: Payer: Self-pay | Admitting: Hematology

## 2016-04-01 ENCOUNTER — Other Ambulatory Visit (HOSPITAL_BASED_OUTPATIENT_CLINIC_OR_DEPARTMENT_OTHER): Payer: Medicare PPO

## 2016-04-01 ENCOUNTER — Ambulatory Visit (HOSPITAL_BASED_OUTPATIENT_CLINIC_OR_DEPARTMENT_OTHER): Payer: Medicare PPO | Admitting: Hematology

## 2016-04-01 ENCOUNTER — Telehealth: Payer: Self-pay | Admitting: Hematology

## 2016-04-01 ENCOUNTER — Other Ambulatory Visit: Payer: Self-pay | Admitting: *Deleted

## 2016-04-01 VITALS — BP 144/70 | HR 88 | Temp 98.3°F | Resp 18 | Wt 94.0 lb

## 2016-04-01 DIAGNOSIS — D72820 Lymphocytosis (symptomatic): Secondary | ICD-10-CM | POA: Diagnosis not present

## 2016-04-01 DIAGNOSIS — C9110 Chronic lymphocytic leukemia of B-cell type not having achieved remission: Secondary | ICD-10-CM

## 2016-04-01 LAB — CBC WITH DIFFERENTIAL/PLATELET
BASO%: 0.4 % (ref 0.0–2.0)
BASOS ABS: 0 10*3/uL (ref 0.0–0.1)
EOS ABS: 0 10*3/uL (ref 0.0–0.5)
EOS%: 0.6 % (ref 0.0–7.0)
HCT: 36.5 % (ref 34.8–46.6)
HEMOGLOBIN: 12.1 g/dL (ref 11.6–15.9)
LYMPH%: 64.8 % — AB (ref 14.0–49.7)
MCH: 31.2 pg (ref 25.1–34.0)
MCHC: 33.2 g/dL (ref 31.5–36.0)
MCV: 93.8 fL (ref 79.5–101.0)
MONO#: 0.4 10*3/uL (ref 0.1–0.9)
MONO%: 4.3 % (ref 0.0–14.0)
NEUT#: 2.4 10*3/uL (ref 1.5–6.5)
NEUT%: 29.9 % — ABNORMAL LOW (ref 38.4–76.8)
PLATELETS: 151 10*3/uL (ref 145–400)
RBC: 3.89 10*6/uL (ref 3.70–5.45)
RDW: 13.6 % (ref 11.2–14.5)
WBC: 8.1 10*3/uL (ref 3.9–10.3)
lymph#: 5.3 10*3/uL — ABNORMAL HIGH (ref 0.9–3.3)

## 2016-04-01 LAB — COMPREHENSIVE METABOLIC PANEL
ALBUMIN: 3.8 g/dL (ref 3.5–5.0)
ALK PHOS: 78 U/L (ref 40–150)
ALT: 7 U/L (ref 0–55)
ANION GAP: 11 meq/L (ref 3–11)
AST: 14 U/L (ref 5–34)
BILIRUBIN TOTAL: 0.56 mg/dL (ref 0.20–1.20)
BUN: 19.3 mg/dL (ref 7.0–26.0)
CO2: 26 mEq/L (ref 22–29)
Calcium: 9.4 mg/dL (ref 8.4–10.4)
Chloride: 105 mEq/L (ref 98–109)
Creatinine: 1 mg/dL (ref 0.6–1.1)
EGFR: 51 mL/min/{1.73_m2} — AB (ref >90)
GLUCOSE: 98 mg/dL (ref 70–140)
POTASSIUM: 4.3 meq/L (ref 3.5–5.1)
SODIUM: 142 meq/L (ref 136–145)
Total Protein: 6.7 g/dL (ref 6.4–8.3)

## 2016-04-01 LAB — LACTATE DEHYDROGENASE: LDH: 196 U/L (ref 125–245)

## 2016-04-01 LAB — TECHNOLOGIST REVIEW

## 2016-04-01 NOTE — Telephone Encounter (Signed)
Appointments scheduled per 04/01/16 los. Patient was given a copy of the AVS report and appointment schedule, per 04/01/16 los.

## 2016-04-01 NOTE — Progress Notes (Signed)
Marland Kitchen    HEMATOLOGY/ONCOLOGY CONSULTATION NOTE  Date of Service: 04/01/2016  Patient Care Team: [name], MD as PCP - General (Family Medicine)  CHIEF COMPLAINTS/PURPOSE OF CONSULTATION:  F/u for CLL  HISTORY OF PRESENTING ILLNESS:   Mary Cline is a wonderful 83 y.o. female who has been referred to [place] by Dr [name], MD  for evaluation and management of lymphocytosis.  Patient has a history of hypertension, diabetes, dyslipidemia, chronic systolic CHF who on recent labs with her primary care physician on 10/16/2015 was noted to have leukocytosis with WBC count of 12.6k with a lymphocyte count of 9.5k and normal hemoglobin of 12.6 with an MCV of 93 and mild thrombocytopenia of 139k.   She had a review of her peripheral blood smear by pathology-day showed absolute lymphocytosis and atypical lymphocytes.  Patient was referred to [place] for further evaluation and management of her lymphocytosis. She reports no fevers no chills no night sweats no unintentional weight loss. Borderline enlarged lymph nodes in left upper neck. No abdominal pain or distention.  Patient notes no other acute new symptoms in the last 3-6 months. Labs were done for routine follow-up. Patient is here with her daughter who helps with providing information as well.  Labs done in clinic today showed a CBC with a hemoglobin within normal limits at 12.7, platelet count slightly decreased at 125k, total WBC count of 9.8k with 7.7k lymphocytes.  INTERVAL HISTORY  Patient is here for her scheduled follow up for CLL with her 2 daughters. No acute new symptoms. No fevers, chills, night sweats,unexplained weight loss or fatigue. She notes that she had a mechanical fall while sweeping leaves that caused some bruising but luckily no fractures. Is on ASA + plavix and notes some skin bruising and intermittently some minor nose bleeds. PLT are WNL.    MEDICAL HISTORY:  Past Medical History:  Diagnosis Date   . Anemia   . Arthritis   . Chronic systolic heart failure ([place]) 04/03/2015  . Diabetes mellitus   . Essential hypertension 04/03/2015  . GERD (gastroesophageal reflux disease)   . Hernia   . Hyperlipidemia 04/03/2015  . Hypertension   . Macular degeneration disease     SURGICAL HISTORY: Past Surgical History:  Procedure Laterality Date  . ANGIOPLASTY    . APPENDECTOMY    . HYSTEROTOMY      SOCIAL HISTORY: Social History   Social History  . Marital status: Widowed    Spouse name: N/A  . Number of children: 4  . Years of education: RN    Occupational History  . Not on file.   Social History Main Topics  . Smoking status: Never Smoker  . Smokeless tobacco: Never Used  . Alcohol use No  . Drug use: No  . Sexual activity: Not on file   Other Topics Concern  . Not on file   Social History Narrative   Patient lives at home with her daughter.    Patient is retired.    Patient is a Education administrator.    Patient was an RN    Patient has 4 children.             [place] Sleepiness Scale = 10 (as of 04/03/2015)    FAMILY HISTORY: Family History  Problem Relation Age of Onset  . Cancer Sister     Breast cancer  . Diabetes Brother   . Diabetes Maternal Grandmother   . Heart Problems Maternal Grandfather   . Heart  Problems Paternal Grandmother   . Heart Problems Paternal Grandfather   . Diabetes Brother   . Diabetes Brother   . Heart Problems Daughter   . Heart Problems Daughter     ALLERGIES:  is allergic to fish allergy; iodine; acth [corticotropin]; actonel [risedronate sodium]; atorvastatin; ciprofloxacin; codeine; demerol [meperidine]; garamycin [gentamicin sulfate]; lactose intolerance (gi); lovastatin; macrobid [nitrofurantoin monohyd macro]; other; paxil [paroxetine hcl]; penicillins; sulfa antibiotics; tetracyclines & related; vioxx [rofecoxib]; and vitamin d analogs.  MEDICATIONS:   Current Outpatient Prescriptions  Medication Sig Dispense Refill  . aspirin 81 MG  tablet Take 81 mg by mouth daily.    . carvedilol (COREG) 3.125 MG tablet Take 1 tablet (3.125 mg total) by mouth 2 (two) times daily with a meal. 180 tablet 3  . clopidogrel (PLAVIX) 75 MG tablet Take 1 tablet (75 mg total) by mouth daily. 90 tablet 3  . furosemide (LASIX) 20 MG tablet Take 1 tablet by mouth as needed. For weight gain more that 2 pounds in a day or 5 pounds in a week.    Marland Kitchen glycerin adult 2 G SUPP Place 1 suppository rectally once as needed for moderate constipation.    . Multiple Vitamin (MULTIVITAMIN) tablet Take 1 tablet by mouth daily.    . NONFORMULARY OR COMPOUNDED ITEM Ketoconazole and fluticasone compounded cream.  Apply as needed to affected areas as needed as directed.    Marland Kitchen omeprazole (PRILOSEC) 40 MG capsule Take 40 mg by mouth daily.    . sacubitril-valsartan (ENTRESTO) 49-51 MG Take 1 tablet by mouth 2 (two) times daily. 180 tablet 3  . traMADol (ULTRAM) 50 MG tablet Take 1 tablet (50 mg total) by mouth 3 (three) times daily as needed for severe pain. 20 tablet 0  . Wheat Dextrin (BENEFIBER) POWD Take 2 scoop by mouth daily.     No current facility-administered medications for this visit.     REVIEW OF SYSTEMS:    10 Point review of Systems was done is negative except as noted above.  PHYSICAL EXAMINATION: ECOG PERFORMANCE STATUS: 2 - Symptomatic, <50% confined to bed  . Vitals:   04/01/16 1442  BP: (!) 144/70  Pulse: 88  Resp: 18  Temp: 98.3 F (36.8 C)   Filed Weights   04/01/16 1442  Weight: 94 lb (42.6 kg)   .Body mass index is 18.36 kg/m.  GENERAL:alert, in no acute distress and comfortable SKIN: skin color, texture, turgor are normal, no rashes or significant lesions EYES: normal, conjunctiva are pink and non-injected, sclera clear OROPHARYNX:no exudate, no erythema and lips, buccal mucosa, and tongue normal  NECK: supple, no JVD, thyroid normal size, non-tender, without nodularity LYMPH:  no palpable lymphadenopathy in the axillary or  inguinal, small left upper cervical lymph node. LUNGS: clear to auscultation with normal respiratory effort HEART: regular rate & rhythm,  no murmurs and no lower extremity edema ABDOMEN: abdomen soft, non-tender, normoactive bowel sounds , no palpable hepatosplenomegaly Musculoskeletal: no cyanosis of digits and no clubbing  PSYCH: alert & oriented x 3 with fluent speech NEURO: no focal motor/sensory deficits  LABORATORY DATA:  I have reviewed the data as listed  . CBC Latest Ref Rng & Units 04/01/2016 11/20/2015 10/16/2015  WBC 3.9 - 10.3 10e3/uL 8.1 9.8 12.6(H)  Hemoglobin 11.6 - 15.9 g/dL 12.1 12.7 12.6  Hematocrit 34.8 - 46.6 % 36.5 37.9 38.7  Platelets 145 - 400 10e3/uL 151 125(L) 139(L)   . CBC    Component Value Date/Time   WBC 8.1  04/01/2016 1417   WBC 12.6 (H) 10/16/2015 1011   RBC 3.89 04/01/2016 1417   RBC 4.15 10/16/2015 1011   HGB 12.1 04/01/2016 1417   HCT 36.5 04/01/2016 1417   PLT 151 04/01/2016 1417   MCV 93.8 04/01/2016 1417   MCH 31.2 04/01/2016 1417   MCH 30.4 10/16/2015 1011   MCHC 33.2 04/01/2016 1417   MCHC 32.6 10/16/2015 1011   RDW 13.6 04/01/2016 1417   LYMPHSABS 5.3 (H) 04/01/2016 1417   MONOABS 0.4 04/01/2016 1417   EOSABS 0.0 04/01/2016 1417   BASOSABS 0.0 04/01/2016 1417    . CMP Latest Ref Rng & Units 04/01/2016 11/20/2015 10/16/2015  Glucose 70 - 140 mg/dl 98 95 123(H)  BUN 7.0 - 26.0 mg/dL 19.3 21.6 23  Creatinine 0.6 - 1.1 mg/dL 1.0 0.9 1.11(H)  Sodium 136 - 145 mEq/L 142 139 139  Potassium 3.5 - 5.1 mEq/L 4.3 4.6 4.8  Chloride 98 - 110 mmol/L - - 102  CO2 22 - 29 mEq/L 26 27 29   Calcium 8.4 - 10.4 mg/dL 9.4 9.1 9.0  Total Protein 6.4 - 8.3 g/dL 6.7 6.3(L) 6.1  Total Bilirubin 0.20 - 1.20 mg/dL 0.56 0.52 0.5  Alkaline Phos 40 - 150 U/L 78 58 57  AST 5 - 34 U/L 14 15 14   ALT 0 - 55 U/L 7 <9 8      Peripheral blood smear reviewed on 11/20/2015 Normocytic red cells with no increased schistocytes. Increased mature atypical  lymphocytosis no increased blasts. No overt platelet clumping noted.   RADIOGRAPHIC STUDIES: I have personally reviewed the radiological images as listed and agreed with the findings in the report. No results found.  ASSESSMENT & PLAN:   83 year old Caucasian female with  #1 Rai Stage 1 CLL -- currently no indications for treatment. Labs stable No anemia or thrombocytopenia at this time Lymphocyte counts stable- down from 9.4k to 5.3k PLAN --Patient currently has no constitutional symptoms no significant anemia or thrombocytopenia no significant lymphadenopathy or symptomatic hepatosplenomegaly at this time. No clear indication for treatment of the patient's CLL at this time. -mild epistaxis from dual anti-platelet therapy and nasal dryness from heating. Recommended could use OTC saline spray for nasal moistening. -CLL Prognostic FISH panel -- sent -She will continue to follow with her primary care physician for management of her other multiple medical comorbidities.  RTC with Dr. Irene Limbo in 6 months with  CBC, CMP, LDH a  All of the patients and her daughters questions were answered with apparent satisfaction. The patient knows to call the clinic with any problems, questions or concerns.  I spent 20 minutes counseling the patient face to face. The total time spent in the appointment was 20 minutes and more than 50% was on counseling and direct patient cares.    Sullivan Lone MD [place] AAHIVMS [facility] Hematology/Oncology Physician [facility]  (Office):       [phone] (Work cell):  [phone] (Fax):           [phone]  04/01/2016 3:03 PM

## 2016-04-21 LAB — FISH,CLL PROGNOSTIC PANEL

## 2016-05-15 ENCOUNTER — Telehealth: Payer: Self-pay | Admitting: Cardiovascular Disease

## 2016-05-15 NOTE — Telephone Encounter (Signed)
Spoke with pt dtr, the number to the [employer] and the number to [employer] assistance given. They will call if problems.

## 2016-05-15 NOTE — Telephone Encounter (Signed)
New Message   Pt c/o medication issue:  1. Name of Medication:   sacubitril-valsartan (ENTRESTO) 49-51 MG   2. How are you currently taking this medication (dosage and times per day)? Two times daily  3. Are you having a reaction (difficulty breathing--STAT)? No  4. What is your medication issue? Per daughter the medication is going up to $300, and patient can't afford. Wanting to know if there is something else she can be prescribed. Requesting call back

## 2016-05-20 DIAGNOSIS — I10 Essential (primary) hypertension: Secondary | ICD-10-CM | POA: Diagnosis not present

## 2016-05-20 DIAGNOSIS — M720 Palmar fascial fibromatosis [Dupuytren]: Secondary | ICD-10-CM | POA: Diagnosis not present

## 2016-05-20 DIAGNOSIS — B354 Tinea corporis: Secondary | ICD-10-CM | POA: Diagnosis not present

## 2016-05-20 DIAGNOSIS — E1140 Type 2 diabetes mellitus with diabetic neuropathy, unspecified: Secondary | ICD-10-CM | POA: Diagnosis not present

## 2016-05-26 DIAGNOSIS — M542 Cervicalgia: Secondary | ICD-10-CM | POA: Diagnosis not present

## 2016-05-26 DIAGNOSIS — M720 Palmar fascial fibromatosis [Dupuytren]: Secondary | ICD-10-CM | POA: Insufficient documentation

## 2016-05-27 ENCOUNTER — Ambulatory Visit: Payer: Medicare PPO | Admitting: Sports Medicine

## 2016-06-03 ENCOUNTER — Encounter: Payer: Self-pay | Admitting: Sports Medicine

## 2016-06-03 ENCOUNTER — Ambulatory Visit (INDEPENDENT_AMBULATORY_CARE_PROVIDER_SITE_OTHER): Payer: Medicare PPO | Admitting: Sports Medicine

## 2016-06-03 DIAGNOSIS — M79674 Pain in right toe(s): Secondary | ICD-10-CM

## 2016-06-03 DIAGNOSIS — M79675 Pain in left toe(s): Secondary | ICD-10-CM

## 2016-06-03 DIAGNOSIS — B351 Tinea unguium: Secondary | ICD-10-CM

## 2016-06-03 DIAGNOSIS — G629 Polyneuropathy, unspecified: Secondary | ICD-10-CM

## 2016-06-03 DIAGNOSIS — M21619 Bunion of unspecified foot: Secondary | ICD-10-CM

## 2016-06-03 DIAGNOSIS — R7303 Prediabetes: Secondary | ICD-10-CM

## 2016-06-03 NOTE — Progress Notes (Signed)
Patient ID: Mary Cline, female   DOB: Dec 18, 1922, 83 y.o.   MRN: [id number]  Subjective: Mary Cline is a 83 y.o. female patient seen today in office with complaint of painful thickened and elongated toenails; unable to trim. Patient denies any changes since last visit with medical problems. Patient states that at night she gets tingling to her toes; may have gotten the wrong cream. Patient has no other pedal complaints at this time.   Patient Active Problem List   Diagnosis Date Noted  . Chronic systolic heart failure ([place]) 04/03/2015  . Essential hypertension 04/03/2015  . Hyperlipidemia 04/03/2015  . Retinal vascular occlusion, unspecified 11/10/2012  . Giant cell arteritis ([place]) 11/10/2012  . Headache(784.0) 11/08/2012  . Visual changes 11/08/2012    Current Outpatient Prescriptions on File Prior to Visit  Medication Sig Dispense Refill  . aspirin 81 MG tablet Take 81 mg by mouth daily.    . carvedilol (COREG) 3.125 MG tablet Take 1 tablet (3.125 mg total) by mouth 2 (two) times daily with a meal. 180 tablet 3  . clopidogrel (PLAVIX) 75 MG tablet Take 1 tablet (75 mg total) by mouth daily. 90 tablet 3  . furosemide (LASIX) 20 MG tablet Take 1 tablet by mouth as needed. For weight gain more that 2 pounds in a day or 5 pounds in a week.    Marland Kitchen glycerin adult 2 G SUPP Place 1 suppository rectally once as needed for moderate constipation.    . Multiple Vitamin (MULTIVITAMIN) tablet Take 1 tablet by mouth daily.    . NONFORMULARY OR COMPOUNDED ITEM Ketoconazole and fluticasone compounded cream.  Apply as needed to affected areas as needed as directed.    Marland Kitchen omeprazole (PRILOSEC) 40 MG capsule Take 40 mg by mouth daily.    . sacubitril-valsartan (ENTRESTO) 49-51 MG Take 1 tablet by mouth 2 (two) times daily. 180 tablet 3  . traMADol (ULTRAM) 50 MG tablet Take 1 tablet (50 mg total) by mouth 3 (three) times daily as needed for severe pain. 20 tablet 0  . Wheat Dextrin (BENEFIBER)  POWD Take 2 scoop by mouth daily.     No current facility-administered medications on file prior to visit.     Allergies  Allergen Reactions  . Fish Allergy Anaphylaxis  . Iodine Anaphylaxis  . Acth [Corticotropin] Other (See Comments)    Confusion   . Actonel [Risedronate Sodium] Nausea And Vomiting  . Atorvastatin Other (See Comments)    Blisters in mouth.    . Ciprofloxacin Nausea And Vomiting  . Codeine Other (See Comments)    Headache   . Demerol [Meperidine] Other (See Comments)    Hypotension   . Garamycin [Gentamicin Sulfate] Rash  . Lactose Intolerance (Gi) Other (See Comments)    Intolerance.    . Lovastatin Other (See Comments)    blisters  . Macrobid [Nitrofurantoin Monohyd Macro] Nausea And Vomiting  . Other Hives and Swelling    Melons and strawberries.    . Paxil [Paroxetine Hcl] Nausea Only  . Penicillins Other (See Comments)    Pain.    . Sulfa Antibiotics Swelling  . Tetracyclines & Related Other (See Comments)    Stomach pain  . Vioxx [Rofecoxib] Other (See Comments)    Not known  . Vitamin D Analogs Other (See Comments)    Dizzy confused    Objective: Physical Exam  General: Well developed, nourished, no acute distress, awake, alert and oriented x 3  Vascular: Dorsalis pedis artery 1/4 bilateral,  Posterior tibial artery 1/4 bilateral, skin temperature warm to warm proximal to distal bilateral lower extremities, mild varicosities, decreased pedal hair present bilateral.  Neurological: Gross sensation present via light touch bilateral. Subjective tingling to toes, likely positional neuropathy  Dermatological: Skin is warm, dry, and supple bilateral, Nails 1-10 are tender, long, thick, and discolored with mild subungal debris, no webspace macerations present bilateral, no open lesions present bilateral, no callus/corns/hyperkeratotic tissue present bilateral. No signs of infection bilateral.  Musculoskeletal: Bunion deformities noted bilateral and  lesser hammertoe that is asymptomatic. Muscular strength within normal limits without pain on range of motion. No pain with calf compression bilateral.  Assessment and Plan:  Problem List Items Addressed This Visit    None    Visit Diagnoses    Dermatophytosis of nail    -  Primary   Bunion       Pre-diabetes       Toe pain, bilateral       Neuropathy ([place])         -Examined patient.  -Discussed treatment options for painful mycotic nails. -Mechanically debrided and reduced mycotic nails with sterile nail nipper and dremel nail file without incident. -Continue with toe spacer to prevent irritation at bunion onto 2nd toe -Recommend patient again to get over-the-counter capsaicin cream to use as needed for tingling in toes, Just prior to bedtime. Also encouraged patient to use cushions and pillows for likely possible positional neuropathy -Patient to return in 2.5 months/as needed for follow up evaluation or sooner if symptoms worsen.  [name], DPM

## 2016-06-10 ENCOUNTER — Telehealth: Payer: Self-pay | Admitting: Cardiovascular Disease

## 2016-06-10 DIAGNOSIS — G5682 Other specified mononeuropathies of left upper limb: Secondary | ICD-10-CM | POA: Diagnosis not present

## 2016-06-10 DIAGNOSIS — M5011 Cervical disc disorder with radiculopathy,  high cervical region: Secondary | ICD-10-CM | POA: Diagnosis not present

## 2016-06-10 MED ORDER — SACUBITRIL-VALSARTAN 49-51 MG PO TABS: 1.0000 | ORAL_TABLET | Freq: Two times a day (BID) | ORAL | 3 refills | Status: DC

## 2016-06-10 NOTE — Telephone Encounter (Signed)
Spoke with daughter and she stated patient was approved with [employer]. When she called to give auth numbers to [facility] she was told to call Medicare. Called Medicare and was told they had never heard of [employer] Samples of Etresto 49/51 mg 1 box LOT # [id number] exp Oct 2019 left at front desk.  Did send a local Rx to [facility] to see if they would accept [place] information Daughter was going to let us know if approved tomorrow when she picks up samples  Will forward to Dr Oval Linsey for recommendations on changes if unable to get [employer]

## 2016-06-10 NOTE — Telephone Encounter (Signed)
Follow up     Mary Cline is $530 for 1 month , they will acept the grant but they can only get 1 months supply of the medication, She will also pick up the samples tomorrow

## 2016-06-10 NOTE — Telephone Encounter (Signed)
New Message     Pt c/o medication issue:  1. Name of Medication: Entresto    4. What is your medication issue? Pt can not afford it , pt is completely out and needs some kind of medication to replace it

## 2016-06-12 NOTE — Telephone Encounter (Signed)
Do either of you know anything about this or how to get her on Entresto through this program?

## 2016-06-12 NOTE — Telephone Encounter (Signed)
F/U Call:  Patient daughter is calling back and would like to discuss information about Entresto medication. Please call, thanks.

## 2016-06-12 NOTE — Telephone Encounter (Signed)
Spoke with daughter.  They will use [employer] money to purchase $530 fist month at [employer].  In the meantime they believe she will qualify for [employer] Patient Assistance.  Forms are being mailed to patient.  I asked daughter to bring those forms to [place] once they fill them out and we will have Dr. Oval Linsey sign her portion and fax to [employer].  Daughter voiced understanding

## 2016-06-16 DIAGNOSIS — M720 Palmar fascial fibromatosis [Dupuytren]: Secondary | ICD-10-CM | POA: Diagnosis not present

## 2016-06-16 DIAGNOSIS — M542 Cervicalgia: Secondary | ICD-10-CM | POA: Diagnosis not present

## 2016-06-16 DIAGNOSIS — M79641 Pain in right hand: Secondary | ICD-10-CM | POA: Diagnosis not present

## 2016-06-16 DIAGNOSIS — M79642 Pain in left hand: Secondary | ICD-10-CM | POA: Diagnosis not present

## 2016-07-18 ENCOUNTER — Telehealth: Payer: Self-pay | Admitting: Cardiovascular Disease

## 2016-07-18 NOTE — Telephone Encounter (Signed)
Please call,she said she was supposed to call you back.

## 2016-07-18 NOTE — Telephone Encounter (Signed)
Spoke with patient's daughter and asked which tablet strength patient has for samples & for Rx. Per Erasmo Downer, [facility] - different tablet strengths can be different colors. Daughter is not with her mother and does not have access to her medications at the moment. She will call back.

## 2016-07-18 NOTE — Telephone Encounter (Signed)
Returned call to daughter regarding Delene Loll tablets/samples She states the bottles, MG, lot #, exp date are all the same but the pills in each bottle look different She states one bottle of the pills had the foil wrapping opened. She states that the pills in the bottle are valsartan tablets - which patient used to take. She states she has the old valsartan tablets in her room and patient cannot access those. She states patient cannot open a bottle of the medication and peel off the foil wrapping with her arthritic hands. Explained to daughter that valsartan tablets would not have been placed in sample bottle and then distributed to them. Explained that samples that are provided to patients are not restocked once they leave the office, even if patients do not need them. Apologized for the inconvenience. Will route to pharmacy as FYI(?)  Needs update on status of patient assistance for [facility] Patient has 1 week of the medication left  Will route to [place], LPN

## 2016-07-18 NOTE — Telephone Encounter (Signed)
New message    Pt daughter is calling stating that they were giving samples of Entresto, 2 bottles. One bottle has white pills and one bottle has brown pills. They are calling to make sure it's ok that one bottle has brown pills, because the one from the pharmacy is white.   She said the end of their medication is about to run out next week. She is asking if any one has heard from [employer] about helping with cost of medication.

## 2016-07-23 MED ORDER — SACUBITRIL-VALSARTAN 49-51 MG PO TABS: 1.0000 | ORAL_TABLET | Freq: Two times a day (BID) | ORAL | 3 refills | Status: DC

## 2016-07-23 NOTE — Telephone Encounter (Signed)
Called patients daughter and left more samples at front desk. Did send patient assistance forms 4/16 and called to follow up. Per patient assistance they have just transitioned to a new computer system and are behind and unable to tell if forms received recommended call back next week. They did however pre-approve her for 3 time fill with valid Rx. Rx printed for Dr Oval Linsey to sign and will fax. Advised daughter

## 2016-08-08 ENCOUNTER — Telehealth: Payer: Self-pay | Admitting: Cardiovascular Disease

## 2016-08-08 NOTE — Telephone Encounter (Signed)
Spoke with daughter and tried to follow up on PA multiple time and last time on hold for over 50 minutes Will follow up next week 1 box of Entresto left at front, forms faxed for 3 rd time

## 2016-08-08 NOTE — Telephone Encounter (Signed)
Rip [place]bour - looks like you've been dealing with this

## 2016-08-08 NOTE — Telephone Encounter (Signed)
New Message  Pt c/o medication issue:  1. Name of Medication: entresto 49-51 mg tablet twice daily  2. How are you currently taking this medication (dosage and times per day)? See above  3. Are you having a reaction (difficulty breathing--STAT)? N/A  4. What is your medication issue? Applying for assistance for this medication and it's been two weeks and have not heard anything and they'll be out on Tuesday and pt calling to get an update of what's going on.

## 2016-08-12 ENCOUNTER — Encounter: Payer: Self-pay | Admitting: Sports Medicine

## 2016-08-12 ENCOUNTER — Ambulatory Visit (INDEPENDENT_AMBULATORY_CARE_PROVIDER_SITE_OTHER): Payer: Medicare PPO | Admitting: Sports Medicine

## 2016-08-12 DIAGNOSIS — Z7901 Long term (current) use of anticoagulants: Secondary | ICD-10-CM

## 2016-08-12 DIAGNOSIS — M79675 Pain in left toe(s): Secondary | ICD-10-CM | POA: Diagnosis not present

## 2016-08-12 DIAGNOSIS — B351 Tinea unguium: Secondary | ICD-10-CM

## 2016-08-12 DIAGNOSIS — M79674 Pain in right toe(s): Secondary | ICD-10-CM

## 2016-08-12 DIAGNOSIS — G629 Polyneuropathy, unspecified: Secondary | ICD-10-CM

## 2016-08-12 DIAGNOSIS — R7303 Prediabetes: Secondary | ICD-10-CM

## 2016-08-12 NOTE — Progress Notes (Signed)
Patient ID: MADYN IVINS, female   DOB: 02-18-1923, 83 y.o.   MRN: [id number]  Subjective: Mary Cline is a 83 y.o. female patient seen today in office with complaint of painful thickened and elongated toenails; unable to trim. Patient denies any changes since last visit with medical problems. Patient states that she has finally started using the capsaicin cream for the neuropathy and pain, which helps. Patient has no other pedal complaints at this time.   Patient Active Problem List   Diagnosis Date Noted  . Chronic systolic heart failure ([place]) 04/03/2015  . Essential hypertension 04/03/2015  . Hyperlipidemia 04/03/2015  . Retinal vascular occlusion, unspecified 11/10/2012  . Giant cell arteritis ([place]) 11/10/2012  . Headache(784.0) 11/08/2012  . Visual changes 11/08/2012    Current Outpatient Prescriptions on File Prior to Visit  Medication Sig Dispense Refill  . aspirin 81 MG tablet Take 81 mg by mouth daily.    . carvedilol (COREG) 3.125 MG tablet Take 1 tablet (3.125 mg total) by mouth 2 (two) times daily with a meal. 180 tablet 3  . clopidogrel (PLAVIX) 75 MG tablet Take 1 tablet (75 mg total) by mouth daily. 90 tablet 3  . furosemide (LASIX) 20 MG tablet Take 1 tablet by mouth as needed. For weight gain more that 2 pounds in a day or 5 pounds in a week.    Marland Kitchen glycerin adult 2 G SUPP Place 1 suppository rectally once as needed for moderate constipation.    . Multiple Vitamin (MULTIVITAMIN) tablet Take 1 tablet by mouth daily.    . NONFORMULARY OR COMPOUNDED ITEM Ketoconazole and fluticasone compounded cream.  Apply as needed to affected areas as needed as directed.    Marland Kitchen omeprazole (PRILOSEC) 40 MG capsule Take 40 mg by mouth daily.    . sacubitril-valsartan (ENTRESTO) 49-51 MG Take 1 tablet by mouth 2 (two) times daily. 180 tablet 3  . traMADol (ULTRAM) 50 MG tablet Take 1 tablet (50 mg total) by mouth 3 (three) times daily as needed for severe pain. 20 tablet 0  . Wheat  Dextrin (BENEFIBER) POWD Take 2 scoop by mouth daily.     No current facility-administered medications on file prior to visit.     Allergies  Allergen Reactions  . Fish Allergy Anaphylaxis  . Iodine Anaphylaxis  . Acth [Corticotropin] Other (See Comments)    Confusion   . Actonel [Risedronate Sodium] Nausea And Vomiting  . Atorvastatin Other (See Comments)    Blisters in mouth.    . Ciprofloxacin Nausea And Vomiting  . Codeine Other (See Comments)    Headache   . Demerol [Meperidine] Other (See Comments)    Hypotension   . Garamycin [Gentamicin Sulfate] Rash  . Lactose Intolerance (Gi) Other (See Comments)    Intolerance.    . Lovastatin Other (See Comments)    blisters  . Macrobid [Nitrofurantoin Monohyd Macro] Nausea And Vomiting  . Other Hives and Swelling    Melons and strawberries.    . Paxil [Paroxetine Hcl] Nausea Only  . Penicillins Other (See Comments)    Pain.    . Sulfa Antibiotics Swelling  . Tetracyclines & Related Other (See Comments)    Stomach pain  . Vioxx [Rofecoxib] Other (See Comments)    Not known  . Vitamin D Analogs Other (See Comments)    Dizzy confused    Objective: Physical Exam  General: Well developed, nourished, no acute distress, awake, alert and oriented x 3  Vascular: Dorsalis pedis artery 1/4  bilateral, Posterior tibial artery 1/4 bilateral, skin temperature warm to warm proximal to distal bilateral lower extremities, mild varicosities, decreased pedal hair present bilateral.  Neurological: Gross sensation present via light touch bilateral. Subjective tingling to toes, likely positional neuropathy  Dermatological: Skin is warm, dry, and supple bilateral, Nails 1-10 are tender, long, thick, and discolored with mild subungal debris, no webspace macerations present bilateral, no open lesions present bilateral, no callus/corns/hyperkeratotic tissue present bilateral. No signs of infection bilateral.  Musculoskeletal: Bunion deformities  noted bilateral and lesser hammertoe that is asymptomatic. Muscular strength within normal limits without pain on range of motion. No pain with calf compression bilateral.  Assessment and Plan:  Problem List Items Addressed This Visit    None    Visit Diagnoses    Dermatophytosis of nail    -  Primary   Pre-diabetes       Neuropathy       Toe pain, bilateral       Long term (current) use of anticoagulants         -Examined patient.  -Discussed treatment options for painful mycotic nails. -Mechanically debrided and reduced mycotic nails with sterile nail nipper and dremel nail file without incident. -Continue with toe spacer to prevent irritation at bunion onto 2nd toe -Recommend Continue with capsaicin cream to use as needed for tingling in toes, Just prior to bedtime. Also encouraged patient to use cushions and pillows for likely possible positional neuropathy -Patient to return in 2.5 months/as needed for follow up evaluation or sooner if symptoms worsen.  [name], DPM

## 2016-08-14 NOTE — Telephone Encounter (Signed)
Finally reached Patient assistance after multiple call  Information just received yesterday, I have faxed on 3 separate occasions. It has been sent to be reviewed per Caryl Pina and will know something next week Left daughter message

## 2016-09-02 NOTE — Telephone Encounter (Signed)
Spoke with [employer] with [employer] and application is still under review. He stated he sent message to get expedited hopefully would know something within 2 business days. Informed Cori Razor I have been trying to reach them via phone for over a week but hold for over an hour at a time no other number available for me to call and follow up except number on application  [phone]

## 2016-09-10 ENCOUNTER — Telehealth: Payer: Self-pay | Admitting: Hematology

## 2016-09-10 NOTE — Telephone Encounter (Signed)
Called patient assistance to follow up again today Was told that this still had not been reviewed but was given to a supervisor to hopefully handle today

## 2016-09-10 NOTE — Telephone Encounter (Signed)
PAL - moved 7/2 lab/fu to 7/17. Left message for patient dtr.

## 2016-09-29 ENCOUNTER — Ambulatory Visit: Payer: Medicare PPO | Admitting: Hematology

## 2016-09-29 ENCOUNTER — Other Ambulatory Visit: Payer: Medicare PPO

## 2016-10-03 NOTE — Telephone Encounter (Signed)
Patient was denied patient assistance and spoke with [employer] rep for options. He recommended [employer] Left message for daughter to call back

## 2016-10-07 ENCOUNTER — Encounter: Payer: Self-pay | Admitting: Cardiovascular Disease

## 2016-10-07 ENCOUNTER — Ambulatory Visit (INDEPENDENT_AMBULATORY_CARE_PROVIDER_SITE_OTHER): Payer: Medicare PPO | Admitting: Cardiovascular Disease

## 2016-10-07 VITALS — BP 134/72 | HR 69 | Ht 60.0 in | Wt 90.0 lb

## 2016-10-07 DIAGNOSIS — I110 Hypertensive heart disease with heart failure: Secondary | ICD-10-CM

## 2016-10-07 DIAGNOSIS — I5042 Chronic combined systolic (congestive) and diastolic (congestive) heart failure: Secondary | ICD-10-CM | POA: Diagnosis not present

## 2016-10-07 DIAGNOSIS — I2510 Atherosclerotic heart disease of native coronary artery without angina pectoris: Secondary | ICD-10-CM | POA: Diagnosis not present

## 2016-10-07 NOTE — Progress Notes (Signed)
Cardiology Office Note   Date:  10/07/2016   ID:  [name], DOB 29-Oct-1922, MRN [id number]  PCP:  [name], MD  Cardiologist:   Skeet Latch, MD   Chief Complaint  Patient presents with  . Follow-up    4 months;   History of Present Illness: Mary Cline is a 83 y.o. female with hypertension, diabetes, GERD, giant cell arteritis, CLL, chronic systolic heart failure LVEF 15-20%, and LBBB here for follow up.  Mary Cline was seen by [name] (cardiolgist, [facility]) on 11/2014.  At that time she was following up after a hospitalization for systolic heart failure that was likely ischemic.  She was initially hospitalized for shortness of breath and was found to have an NSTEMI.  Troponin peaked at 0.17.  BNP was 33,000. EKG revealed LBBB and no prior EKGs were available for comparison.  Her symptoms improved with diuresis.   Her family decided not to pursue aggressive measures given her age.    Since her last appointment [name] has been unable to get financial assistance to afford Entresto.  Year it costs $500 before she meets her deductible. After the deductible is met the cost is much more affordable. She is otherwise been doing well. She denies chest pain or shortness of breath she has not noted any lower extremity edema. She sleeps with the head of her bed elevated which is chronic.  She has not been getting much exercise lately due to the weather. She typically likes to walk in her driveway but hasn't done is likely due to the heat.  She denies melena or hematochezia but does note easy bruising.   Past Medical History:  Diagnosis Date  . Anemia   . Arthritis   . Chronic systolic heart failure ([place]) 04/03/2015  . Diabetes mellitus   . Essential hypertension 04/03/2015  . GERD (gastroesophageal reflux disease)   . Hernia   . Hyperlipidemia 04/03/2015  . Hypertension   . Macular degeneration disease     Past Surgical History:    Procedure Laterality Date  . ANGIOPLASTY    . APPENDECTOMY    . HYSTEROTOMY       Current Outpatient Prescriptions  Medication Sig Dispense Refill  . aspirin 81 MG tablet Take 81 mg by mouth daily.    . carvedilol (COREG) 3.125 MG tablet Take 1 tablet (3.125 mg total) by mouth 2 (two) times daily with a meal. 180 tablet 3  . furosemide (LASIX) 20 MG tablet Take 1 tablet by mouth as needed. For weight gain more that 2 pounds in a day or 5 pounds in a week.    Marland Kitchen glycerin adult 2 G SUPP Place 1 suppository rectally once as needed for moderate constipation.    . Multiple Vitamin (MULTIVITAMIN) tablet Take 1 tablet by mouth daily.    . NONFORMULARY OR COMPOUNDED ITEM Ketoconazole and fluticasone compounded cream.  Apply as needed to affected areas as needed as directed.    Marland Kitchen omeprazole (PRILOSEC) 40 MG capsule Take 40 mg by mouth daily.    . sacubitril-valsartan (ENTRESTO) 49-51 MG Take 1 tablet by mouth 2 (two) times daily. 180 tablet 3  . traMADol (ULTRAM) 50 MG tablet Take 1 tablet (50 mg total) by mouth 3 (three) times daily as needed for severe pain. 20 tablet 0  . Wheat Dextrin (BENEFIBER) POWD Take 2 scoop by mouth daily.     No current facility-administered medications for this visit.  Allergies:   Fish allergy; Iodine; Acth [corticotropin]; Actonel [risedronate sodium]; Atorvastatin; Ciprofloxacin; Codeine; Demerol [meperidine]; Garamycin [gentamicin sulfate]; Lactose intolerance (gi); Lovastatin; Macrobid [nitrofurantoin monohyd macro]; Other; Paxil [paroxetine hcl]; Penicillins; Sulfa antibiotics; Tetracyclines & related; Vioxx [rofecoxib]; and Vitamin d analogs    Social History:  The patient  reports that she has never smoked. She has never used smokeless tobacco. She reports that she does not drink alcohol or use drugs.   Family History:  The patient's family history includes Cancer in her sister; Diabetes in her brother, brother, brother, and maternal grandmother; Heart  Problems in her daughter, daughter, maternal grandfather, paternal grandfather, and paternal grandmother.    ROS:  Please see the history of present illness.   Otherwise, review of systems are positive for pain in feet and memory loss.   All other systems are reviewed and negative.    PHYSICAL EXAM: VS:  BP 134/72   Pulse 69   Ht 5' (1.524 m)   Wt 40.8 kg (90 lb)   BMI 17.58 kg/m  , BMI Body mass index is 17.58 kg/m. GENERAL:  Well appearing.  Frail.  No acute distress HEENT:   Pupils equal round and reactive, fundi not visualized, oral mucosa unremarkable NECK:  No jugular venous distention, waveform within normal limits, carotid upstroke brisk and symmetric, no bruits, no thyromegaly LYMPHATICS:  No cervical adenopathy LUNGS:  Clear to auscultation bilaterally HEART:  RRR.  PMI not displaced or sustained,S1 and S2 within normal limits, no S3, no S4, no clicks, no rubs, no murmurs ABD:  Flat, positive bowel sounds normal in frequency in pitch, no bruits, no rebound, no guarding, no midline pulsatile mass, no hepatomegaly, no splenomegaly EXT:  2 plus pulses throughout, no edema, no cyanosis no clubbing SKIN:  No rashes no nodules.  Pale NEURO:  Cranial nerves II through XII grossly intact, motor grossly intact throughout PSYCH:  Cognitively intact, oriented to person place and time  EKG:  EKG is ordered today. 11/23/15: Sinus rhythm.  Rate 76 bpm.  LBBB. The ekg ordered 04/03/15 demonstrates sinus rhythm.  LBBB.  10/07/16: Sinus rhythm rate 69 bpm.  LBBB.   Echo 12/18/14: LVEF 23-53%  Grade 3 diastolic dysfunction.  Mod-severely dilated LA.  RA mildly dilated.  Mild aortic valve sclerosis without stenosis.  Moderate mitral valve thickening.  Mild TR.    Recent Labs: 10/16/2015: Brain Natriuretic Peptide 686.1 04/01/2016: ALT 7; BUN 19.3; Creatinine 1.0; HGB 12.1; Platelets 151; Potassium 4.3; Sodium 142   05/20/16:  Sodium 141, potassium 4.6, BUN 21, creatinine 1.05 Hemoglobin A1c  6.3% AST 13, ALT 9 Total cholesterol 208, triglycerides 93, HDL 61, LDL 129  Lipid Panel    Component Value Date/Time   CHOL  09/13/2009 0620    157        ATP III CLASSIFICATION:  <200     mg/dL   Desirable  200-239  mg/dL   Borderline High  >=240    mg/dL   High          TRIG 112 09/13/2009 0620   HDL 41 09/13/2009 0620   CHOLHDL 3.8 09/13/2009 0620   VLDL 22 09/13/2009 0620   LDLCALC  09/13/2009 0620    94        Total Cholesterol/HDL:CHD Risk Coronary Heart Disease Risk Table                     Men   Women  1/2 Average Risk   3.4  3.3  Average Risk       5.0   4.4  2 X Average Risk   9.6   7.1  3 X Average Risk  23.4   11.0        Use the calculated Patient Ratio above and the CHD Risk Table to determine the patient's CHD Risk.        ATP III CLASSIFICATION (LDL):  <100     mg/dL   Optimal  100-129  mg/dL   Near or Above                    Optimal  130-159  mg/dL   Borderline  160-189  mg/dL   High  >190     mg/dL   Very High      Wt Readings from Last 3 Encounters:  10/07/16 40.8 kg (90 lb)  04/01/16 42.6 kg (94 lb)  03/17/16 42.4 kg (93 lb 6.4 oz)      ASSESSMENT AND PLAN:  # Chronic systolic heart failure: Ms. Landowski is doing well and is euvolemic. She sleeps on a wedge but is stable.  Continue carvedilol, Entresto and prn lasix.  # Hypertension: BP well-controlled.  Continue carvedilol and Entresto.  # CAD s/p NSTEMI: Ms. Dressel had a mild NSTEMI 11/2014 that has been medically managed.  She has not experienced any chest pain and her troponin elevation was most consistent with demand ischemia in the setting of a BNP of 33,000. Given her bleeding risk we will stop clopidogrel at this time.  Continue aspirin, clopidogrel, and carvedilol.  She hasn't tolerated statins in the past.  LDL was 129 05/2016.  We will start Zetia 10 mg daily and repeat lipids and CMP in 6 weeks.   Current medicines are reviewed at length with the patient today.  The  patient does not have concerns regarding medicines.  The following changes have been made:  Stop clopidogrel.  Start ezetimibe 10 mg daily.    Labs/ tests ordered today include:   No orders of the defined types were placed in this encounter.    Disposition:   FU with Adarsh Mundorf C. Oval Linsey, MD, [facility] in 6 months.    This note was written with the assistance of speech recognition software.  Please excuse any transcriptional errors.  Signed, Sinthia Karabin C. Oval Linsey, MD, [facility]  10/07/2016 12:54 PM    [place]

## 2016-10-07 NOTE — Telephone Encounter (Signed)
Discussed in clinic today and high cost  ($500 plus for 1 month) of Delene Loll was just beginning of year before deductible met

## 2016-10-07 NOTE — Patient Instructions (Signed)
Medication Instructions:  STOP PLAVIX (CLOPIDGRIL)   Labwork: NONE  Testing/Procedures: NONE  Follow-Up: Your physician wants you to follow-up in: [place] will receive a reminder letter in the mail two months in advance. If you don't receive a letter, please call our office to schedule the follow-up appointment.    If you need a refill on your cardiac medications before your next appointment, please call your pharmacy.

## 2016-10-09 ENCOUNTER — Telehealth: Payer: Self-pay | Admitting: *Deleted

## 2016-10-09 DIAGNOSIS — Z79899 Other long term (current) drug therapy: Secondary | ICD-10-CM

## 2016-10-09 DIAGNOSIS — E7800 Pure hypercholesterolemia, unspecified: Secondary | ICD-10-CM

## 2016-10-09 MED ORDER — EZETIMIBE 10 MG PO TABS: 10.0000 mg | ORAL_TABLET | Freq: Every day | ORAL | 3 refills | Status: DC

## 2016-10-09 MED ORDER — FUROSEMIDE 20 MG PO TABS: 20.0000 mg | ORAL_TABLET | ORAL | 3 refills | Status: AC | PRN

## 2016-10-09 MED ORDER — CARVEDILOL 3.125 MG PO TABS: 3.1250 mg | ORAL_TABLET | Freq: Two times a day (BID) | ORAL | 3 refills | Status: DC

## 2016-10-09 NOTE — Telephone Encounter (Signed)
Dr Oval Linsey reviewed patients labs from [facility] done 05/2016 and would like for her to start Zetia 10 mg daily, recheck lab lp/cmet in 6 weeks

## 2016-10-14 ENCOUNTER — Other Ambulatory Visit (HOSPITAL_BASED_OUTPATIENT_CLINIC_OR_DEPARTMENT_OTHER): Payer: Medicare PPO

## 2016-10-14 ENCOUNTER — Encounter: Payer: Self-pay | Admitting: Hematology

## 2016-10-14 ENCOUNTER — Telehealth: Payer: Self-pay | Admitting: Hematology

## 2016-10-14 ENCOUNTER — Ambulatory Visit (HOSPITAL_BASED_OUTPATIENT_CLINIC_OR_DEPARTMENT_OTHER): Payer: Medicare PPO | Admitting: Hematology

## 2016-10-14 VITALS — BP 140/69 | HR 70 | Temp 98.6°F | Resp 16 | Ht 60.0 in | Wt 90.0 lb

## 2016-10-14 DIAGNOSIS — C9110 Chronic lymphocytic leukemia of B-cell type not having achieved remission: Secondary | ICD-10-CM

## 2016-10-14 LAB — COMPREHENSIVE METABOLIC PANEL
ALT: 10 U/L (ref 0–55)
ANION GAP: 9 meq/L (ref 3–11)
AST: 15 U/L (ref 5–34)
Albumin: 3.6 g/dL (ref 3.5–5.0)
Alkaline Phosphatase: 61 U/L (ref 40–150)
BUN: 21.4 mg/dL (ref 7.0–26.0)
CALCIUM: 9.2 mg/dL (ref 8.4–10.4)
CHLORIDE: 104 meq/L (ref 98–109)
CO2: 28 meq/L (ref 22–29)
CREATININE: 1 mg/dL (ref 0.6–1.1)
EGFR: 52 mL/min/{1.73_m2} — AB (ref >90)
Glucose: 98 mg/dl (ref 70–140)
POTASSIUM: 4.6 meq/L (ref 3.5–5.1)
Sodium: 142 mEq/L (ref 136–145)
Total Bilirubin: 0.46 mg/dL (ref 0.20–1.20)
Total Protein: 6.3 g/dL — ABNORMAL LOW (ref 6.4–8.3)

## 2016-10-14 LAB — CBC & DIFF AND RETIC
BASO%: 0.2 % (ref 0.0–2.0)
Basophils Absolute: 0 10*3/uL (ref 0.0–0.1)
EOS%: 0.8 % (ref 0.0–7.0)
Eosinophils Absolute: 0.1 10*3/uL (ref 0.0–0.5)
HEMATOCRIT: 37.5 % (ref 34.8–46.6)
HGB: 12.4 g/dL (ref 11.6–15.9)
Immature Retic Fract: 2.7 % (ref 1.60–10.00)
LYMPH%: 71.3 % — AB (ref 14.0–49.7)
MCH: 31.4 pg (ref 25.1–34.0)
MCHC: 33.2 g/dL (ref 31.5–36.0)
MCV: 94.6 fL (ref 79.5–101.0)
MONO#: 0.3 10*3/uL (ref 0.1–0.9)
MONO%: 3.6 % (ref 0.0–14.0)
NEUT%: 24.1 % — AB (ref 38.4–76.8)
NEUTROS ABS: 2.3 10*3/uL (ref 1.5–6.5)
PLATELETS: 142 10*3/uL — AB (ref 145–400)
RBC: 3.96 10*6/uL (ref 3.70–5.45)
RDW: 13.5 % (ref 11.2–14.5)
Retic %: 1.22 % (ref 0.70–2.10)
Retic Ct Abs: 48.31 10*3/uL (ref 33.70–90.70)
WBC: 9.6 10*3/uL (ref 3.9–10.3)
lymph#: 6.8 10*3/uL — ABNORMAL HIGH (ref 0.9–3.3)
nRBC: 0 % (ref 0–0)

## 2016-10-14 LAB — TECHNOLOGIST REVIEW

## 2016-10-14 LAB — LACTATE DEHYDROGENASE: LDH: 178 U/L (ref 125–245)

## 2016-10-14 NOTE — Patient Instructions (Signed)
Thank you for choosing [facility] to provide your oncology and hematology care.  To afford each patient quality time with our providers, please arrive 30 minutes before your scheduled appointment time.  If you arrive late for your appointment, you may be asked to reschedule.  We strive to give you quality time with our providers, and arriving late affects you and other patients whose appointments are after yours.   If you are a no show for multiple scheduled visits, you may be dismissed from the clinic at the providers discretion.    Again, thank you for choosing [facility], our hope is that these requests will decrease the amount of time that you wait before being seen by our physicians.  ______________________________________________________________________  Should you have questions after your visit to the [facility], please contact our office at [phone] between the hours of 8:30 and 4:30 p.m.    Voicemails left after 4:30p.m will not be returned until the following business day.    For prescription refill requests, please have your pharmacy contact us directly.  Please also try to allow 48 hours for prescription requests.    Please contact the scheduling department for questions regarding scheduling.  For scheduling of procedures such as PET scans, CT scans, MRI, Ultrasound, etc please contact central scheduling at [phone].    Resources For Cancer Patients and Caregivers:   Oncolink.org:  A wonderful resource for patients and healthcare providers for information regarding your disease, ways to tract your treatment, what to expect, etc.     American Cancer Society:  800-227-2345  Can help patients locate various types of support and financial assistance  Cancer Care: 1-800-813-HOPE (4673) Provides financial assistance, online support groups, medication/co-pay assistance.    [place] DSS:  336-641-3447 Where to apply for food  stamps, Medicaid, and utility assistance  Medicare Rights Center: 800-333-4114 Helps people with Medicare understand their rights and benefits, navigate the Medicare system, and secure the quality healthcare they deserve  SCAT: 336-333-6589 [place] [employer]'s shared-ride transportation service for eligible riders who have a disability that prevents them from riding the fixed route bus.    For additional information on assistance programs please contact our social worker:   Grier Hock/Abigail Elmore:  [phone]            

## 2016-10-14 NOTE — Telephone Encounter (Signed)
Gave patient avs report and appointments for January  °

## 2016-10-21 ENCOUNTER — Ambulatory Visit (INDEPENDENT_AMBULATORY_CARE_PROVIDER_SITE_OTHER): Payer: Medicare PPO | Admitting: Sports Medicine

## 2016-10-21 DIAGNOSIS — M79674 Pain in right toe(s): Secondary | ICD-10-CM

## 2016-10-21 DIAGNOSIS — G629 Polyneuropathy, unspecified: Secondary | ICD-10-CM

## 2016-10-21 DIAGNOSIS — Z7901 Long term (current) use of anticoagulants: Secondary | ICD-10-CM

## 2016-10-21 DIAGNOSIS — M79675 Pain in left toe(s): Secondary | ICD-10-CM

## 2016-10-21 DIAGNOSIS — B351 Tinea unguium: Secondary | ICD-10-CM | POA: Diagnosis not present

## 2016-10-21 DIAGNOSIS — R7303 Prediabetes: Secondary | ICD-10-CM

## 2016-10-21 NOTE — Progress Notes (Signed)
Patient ID: Mary Cline, female   DOB: 07/20/1922, 83 y.o.   MRN: [id number]  Subjective: Mary Cline is a 83 y.o. female patient seen today in office with complaint of painful thickened and elongated toenails; unable to trim. Patient denies any changes since last visit with medical problems. Patient has no other pedal complaints at this time.   Patient Active Problem List   Diagnosis Date Noted  . Chronic systolic heart failure ([place]) 04/03/2015  . Essential hypertension 04/03/2015  . Hyperlipidemia 04/03/2015  . Retinal vascular occlusion, unspecified 11/10/2012  . Giant cell arteritis ([place]) 11/10/2012  . Headache(784.0) 11/08/2012  . Visual changes 11/08/2012    Current Outpatient Prescriptions on File Prior to Visit  Medication Sig Dispense Refill  . aspirin 81 MG tablet Take 81 mg by mouth daily.    . carvedilol (COREG) 3.125 MG tablet Take 1 tablet (3.125 mg total) by mouth 2 (two) times daily with a meal. 180 tablet 3  . ezetimibe (ZETIA) 10 MG tablet Take 1 tablet (10 mg total) by mouth daily. 90 tablet 3  . furosemide (LASIX) 20 MG tablet Take 1 tablet (20 mg total) by mouth as needed. For weight gain more that 2 pounds in a day or 5 pounds in a week. 90 tablet 3  . glycerin adult 2 G SUPP Place 1 suppository rectally once as needed for moderate constipation.    . Multiple Vitamin (MULTIVITAMIN) tablet Take 1 tablet by mouth daily.    . NONFORMULARY OR COMPOUNDED ITEM Ketoconazole and fluticasone compounded cream.  Apply as needed to affected areas as needed as directed.    Marland Kitchen omeprazole (PRILOSEC) 40 MG capsule Take 40 mg by mouth daily.    . sacubitril-valsartan (ENTRESTO) 49-51 MG Take 1 tablet by mouth 2 (two) times daily. 180 tablet 3  . traMADol (ULTRAM) 50 MG tablet Take 1 tablet (50 mg total) by mouth 3 (three) times daily as needed for severe pain. 20 tablet 0  . Wheat Dextrin (BENEFIBER) POWD Take 2 scoop by mouth daily.     No current  facility-administered medications on file prior to visit.     Allergies  Allergen Reactions  . Fish Allergy Anaphylaxis  . Iodine Anaphylaxis  . Acth [Corticotropin] Other (See Comments)    Confusion   . Actonel [Risedronate Sodium] Nausea And Vomiting  . Atorvastatin Other (See Comments)    Blisters in mouth.    . Ciprofloxacin Nausea And Vomiting  . Codeine Other (See Comments)    Headache   . Demerol [Meperidine] Other (See Comments)    Hypotension   . Garamycin [Gentamicin Sulfate] Rash  . Lactose Intolerance (Gi) Other (See Comments)    Intolerance.    . Lovastatin Other (See Comments)    blisters  . Macrobid [Nitrofurantoin Monohyd Macro] Nausea And Vomiting  . Other Hives and Swelling    Melons and strawberries.    . Paxil [Paroxetine Hcl] Nausea Only  . Penicillins Other (See Comments)    Pain.    . Sulfa Antibiotics Swelling  . Tetracyclines & Related Other (See Comments)    Stomach pain  . Vioxx [Rofecoxib] Other (See Comments)    Not known  . Vitamin D Analogs Other (See Comments)    Dizzy confused    Objective: Physical Exam  General: Well developed, nourished, no acute distress, awake, alert and oriented x 3  Vascular: Dorsalis pedis artery 1/4 bilateral, Posterior tibial artery 1/4 bilateral, skin temperature warm to warm proximal to distal  bilateral lower extremities, mild varicosities, decreased pedal hair present bilateral.  Neurological: Gross sensation present via light touch bilateral. Subjective tingling to toes, likely positional neuropathy  Dermatological: Skin is warm, dry, and supple bilateral, Nails 1-10 are tender, long, thick, and discolored with mild subungal debris, no webspace macerations present bilateral, no open lesions present bilateral, no callus/corns/hyperkeratotic tissue present bilateral. No signs of infection bilateral.  Musculoskeletal: Bunion deformities noted bilateral and lesser hammertoe that is asymptomatic. Muscular  strength within normal limits without pain on range of motion. No pain with calf compression bilateral.  Assessment and Plan:  Problem List Items Addressed This Visit    None    Visit Diagnoses    Dermatophytosis of nail    -  Primary   Pre-diabetes       Neuropathy       Toe pain, bilateral       Long term (current) use of anticoagulants         -Examined patient.  -Discussed treatment options for painful mycotic nails. -Mechanically debrided and reduced mycotic nails with sterile nail nipper and dremel nail file without incident. -Continue with toe spacer to prevent irritation at bunion onto 2nd toe -Recommend Continue with capsaicin cream to use as needed for tingling in toes, Just prior to bedtime. This was re-discussed with daughter present -Patient to return in 2.5 months/as needed for follow up evaluation or sooner if symptoms worsen.  [name], DPM

## 2016-11-05 NOTE — Progress Notes (Signed)
Marland Kitchen    HEMATOLOGY/ONCOLOGY CLINIC NOTE  Date of Service:.10/14/2016  Patient Care Team: Rankins, Bill Salinas, MD as PCP - General (Family Medicine)  CHIEF COMPLAINTS/PURPOSE OF CONSULTATION:  F/u for CLL  HISTORY OF PRESENTING ILLNESS:   Mary Cline is a wonderful 82 y.o. female who has been referred to [place] by Dr [name], Bill Salinas, MD  for evaluation and management of lymphocytosis.  Patient has a history of hypertension, diabetes, dyslipidemia, chronic systolic CHF who on recent labs with her primary care physician on 10/16/2015 was noted to have leukocytosis with WBC count of 12.6k with a lymphocyte count of 9.5k and normal hemoglobin of 12.6 with an MCV of 93 and mild thrombocytopenia of 139k.   She had a review of her peripheral blood smear by pathology-day showed absolute lymphocytosis and atypical lymphocytes.  Patient was referred to [place] for further evaluation and management of her lymphocytosis. She reports no fevers no chills no night sweats no unintentional weight loss. Borderline enlarged lymph nodes in left upper neck. No abdominal pain or distention.  Patient notes no other acute new symptoms in the last 3-6 months. Labs were done for routine follow-up. Patient is here with her daughter who helps with providing information as well.  Labs done in clinic today showed a CBC with a hemoglobin within normal limits at 12.7, platelet count slightly decreased at 125k, total WBC count of 9.8k with 7.7k lymphocytes.  INTERVAL HISTORY  Patient is here for her scheduled follow up for CLL with her daughter. No acute new symptoms. No fevers, chills, night sweats,unexplained weight loss or fatigue. No overt infections or significant new fatigue. Does have some easy bruisability due to antiplatelet therapies and senile purpura but no other significant bleeding. Platelets is stable at 142k hemoglobin is normal at 12.4k    MEDICAL HISTORY:  Past Medical History:  Diagnosis Date    . Anemia   . Arthritis   . Chronic systolic heart failure ([place]) 04/03/2015  . Diabetes mellitus   . Essential hypertension 04/03/2015  . GERD (gastroesophageal reflux disease)   . Hernia   . Hyperlipidemia 04/03/2015  . Hypertension   . Macular degeneration disease     SURGICAL HISTORY: Past Surgical History:  Procedure Laterality Date  . ANGIOPLASTY    . APPENDECTOMY    . HYSTEROTOMY      SOCIAL HISTORY: Social History   Social History  . Marital status: Widowed    Spouse name: N/A  . Number of children: 4  . Years of education: RN    Occupational History  . Not on file.   Social History Main Topics  . Smoking status: Never Smoker  . Smokeless tobacco: Never Used  . Alcohol use No  . Drug use: No  . Sexual activity: Not on file   Other Topics Concern  . Not on file   Social History Narrative   Patient lives at home with her daughter.    Patient is retired.    Patient is a Education administrator.    Patient was an RN    Patient has 4 children.             Epworth Sleepiness Scale = 10 (as of 04/03/2015)    FAMILY HISTORY: Family History  Problem Relation Age of Onset  . Cancer Sister        Breast cancer  . Diabetes Brother   . Diabetes Maternal Grandmother   . Heart Problems Maternal Grandfather   . Heart Problems Paternal  Grandmother   . Heart Problems Paternal Grandfather   . Diabetes Brother   . Diabetes Brother   . Heart Problems Daughter   . Heart Problems Daughter     ALLERGIES:  is allergic to fish allergy; iodine; acth [corticotropin]; actonel [risedronate sodium]; atorvastatin; ciprofloxacin; codeine; demerol [meperidine]; garamycin [gentamicin sulfate]; lactose intolerance (gi); lovastatin; macrobid [nitrofurantoin monohyd macro]; other; paxil [paroxetine hcl]; penicillins; sulfa antibiotics; tetracyclines & related; vioxx [rofecoxib]; and vitamin d analogs.  MEDICATIONS:   Current Outpatient Prescriptions  Medication Sig Dispense Refill  . aspirin 81  MG tablet Take 81 mg by mouth daily.    . carvedilol (COREG) 3.125 MG tablet Take 1 tablet (3.125 mg total) by mouth 2 (two) times daily with a meal. 180 tablet 3  . ezetimibe (ZETIA) 10 MG tablet Take 1 tablet (10 mg total) by mouth daily. 90 tablet 3  . furosemide (LASIX) 20 MG tablet Take 1 tablet (20 mg total) by mouth as needed. For weight gain more that 2 pounds in a day or 5 pounds in a week. 90 tablet 3  . glycerin adult 2 G SUPP Place 1 suppository rectally once as needed for moderate constipation.    . Multiple Vitamin (MULTIVITAMIN) tablet Take 1 tablet by mouth daily.    . NONFORMULARY OR COMPOUNDED ITEM Ketoconazole and fluticasone compounded cream.  Apply as needed to affected areas as needed as directed.    Marland Kitchen omeprazole (PRILOSEC) 40 MG capsule Take 40 mg by mouth daily.    . sacubitril-valsartan (ENTRESTO) 49-51 MG Take 1 tablet by mouth 2 (two) times daily. 180 tablet 3  . traMADol (ULTRAM) 50 MG tablet Take 1 tablet (50 mg total) by mouth 3 (three) times daily as needed for severe pain. 20 tablet 0  . Wheat Dextrin (BENEFIBER) POWD Take 2 scoop by mouth daily.     No current facility-administered medications for this visit.     REVIEW OF SYSTEMS:    10 Point review of Systems was done is negative except as noted above.  PHYSICAL EXAMINATION: ECOG PERFORMANCE STATUS: 2 - Symptomatic, <50% confined to bed  . Vitals:   10/14/16 1526  BP: 140/69  Pulse: 70  Resp: 16  Temp: 98.6 F (37 C)   Filed Weights   10/14/16 1526  Weight: 90 lb (40.8 kg)   .Body mass index is 17.58 kg/m.  GENERAL:alert, in no acute distress and comfortable SKIN: skin color, texture, turgor are normal, no rashes or significant lesions EYES: normal, conjunctiva are pink and non-injected, sclera clear OROPHARYNX:no exudate, no erythema and lips, buccal mucosa, and tongue normal  NECK: supple, no JVD, thyroid normal size, non-tender, without nodularity LYMPH:  no palpable lymphadenopathy in  the axillary or inguinal, small left upper cervical lymph node. LUNGS: clear to auscultation with normal respiratory effort HEART: regular rate & rhythm,  no murmurs and no lower extremity edema ABDOMEN: abdomen soft, non-tender, normoactive bowel sounds , no palpable hepatosplenomegaly Musculoskeletal: no cyanosis of digits and no clubbing  PSYCH: alert & oriented x 3 with fluent speech NEURO: no focal motor/sensory deficits  LABORATORY DATA:  I have reviewed the data as listed  . CBC Latest Ref Rng & Units 10/14/2016 04/01/2016 11/20/2015  WBC 3.9 - 10.3 10e3/uL 9.6 8.1 9.8  Hemoglobin 11.6 - 15.9 g/dL 12.4 12.1 12.7  Hematocrit 34.8 - 46.6 % 37.5 36.5 37.9  Platelets 145 - 400 10e3/uL 142(L) 151 125(L)   . CBC    Component Value Date/Time   WBC  9.6 10/14/2016 1404   WBC 12.6 (H) 10/16/2015 1011   RBC 3.96 10/14/2016 1404   RBC 4.15 10/16/2015 1011   HGB 12.4 10/14/2016 1404   HCT 37.5 10/14/2016 1404   PLT 142 (L) 10/14/2016 1404   MCV 94.6 10/14/2016 1404   MCH 31.4 10/14/2016 1404   MCH 30.4 10/16/2015 1011   MCHC 33.2 10/14/2016 1404   MCHC 32.6 10/16/2015 1011   RDW 13.5 10/14/2016 1404   LYMPHSABS 6.8 (H) 10/14/2016 1404   MONOABS 0.3 10/14/2016 1404   EOSABS 0.1 10/14/2016 1404   BASOSABS 0.0 10/14/2016 1404    . CMP Latest Ref Rng & Units 10/14/2016 04/01/2016 11/20/2015  Glucose 70 - 140 mg/dl 98 98 95  BUN 7.0 - 26.0 mg/dL 21.4 19.3 21.6  Creatinine 0.6 - 1.1 mg/dL 1.0 1.0 0.9  Sodium 136 - 145 mEq/L 142 142 139  Potassium 3.5 - 5.1 mEq/L 4.6 4.3 4.6  Chloride 98 - 110 mmol/L - - -  CO2 22 - 29 mEq/L 28 26 27   Calcium 8.4 - 10.4 mg/dL 9.2 9.4 9.1  Total Protein 6.4 - 8.3 g/dL 6.3(L) 6.7 6.3(L)  Total Bilirubin 0.20 - 1.20 mg/dL 0.46 0.56 0.52  Alkaline Phos 40 - 150 U/L 61 78 58  AST 5 - 34 U/L 15 14 15   ALT 0 - 55 U/L 10 7 <9         RADIOGRAPHIC STUDIES: I have personally reviewed the radiological images as listed and agreed with the findings in  the report. No results found.  ASSESSMENT & PLAN:   83 year old Caucasian female with  #1 Rai Stage 1 CLL -- currently no indications for treatment. Biallelic deletion of 77N Labs stable No anemia or significant thrombocytopenia at this time Lymphocyte counts stable- 9.4k --> 5.3k ---> 6.8k PLAN --Patient currently has no constitutional symptoms no significant anemia or thrombocytopenia no significant lymphadenopathy or symptomatic hepatosplenomegaly at this time.  -No clear indication for treatment of the patient's CLL at this time. -CLL Prognostic FISH panel -- reviewed with the patient -She will continue to follow with her primary care physician for management of her other multiple medical comorbidities.  RTC with Dr. Irene Limbo in 6 months with  CBC, CMP, LDH   All of the patients and her daughters questions were answered with apparent satisfaction. The patient knows to call the clinic with any problems, questions or concerns.  I spent 20 minutes counseling the patient face to face. The total time spent in the appointment was 20 minutes and more than 50% was on counseling and direct patient cares.    Sullivan Lone MD [place] AAHIVMS [facility] Hematology/Oncology Physician [facility]  (Office):       [phone] (Work cell):  [phone] (Fax):           [phone]

## 2016-11-17 DIAGNOSIS — B354 Tinea corporis: Secondary | ICD-10-CM | POA: Diagnosis not present

## 2016-11-17 DIAGNOSIS — M1990 Unspecified osteoarthritis, unspecified site: Secondary | ICD-10-CM | POA: Diagnosis not present

## 2016-11-17 DIAGNOSIS — Z23 Encounter for immunization: Secondary | ICD-10-CM | POA: Diagnosis not present

## 2016-11-17 DIAGNOSIS — G8929 Other chronic pain: Secondary | ICD-10-CM | POA: Diagnosis not present

## 2016-11-25 DIAGNOSIS — I110 Hypertensive heart disease with heart failure: Secondary | ICD-10-CM | POA: Diagnosis not present

## 2016-11-25 DIAGNOSIS — H9193 Unspecified hearing loss, bilateral: Secondary | ICD-10-CM | POA: Diagnosis not present

## 2016-11-25 DIAGNOSIS — I252 Old myocardial infarction: Secondary | ICD-10-CM | POA: Diagnosis not present

## 2016-11-25 DIAGNOSIS — E785 Hyperlipidemia, unspecified: Secondary | ICD-10-CM | POA: Diagnosis not present

## 2016-11-25 DIAGNOSIS — I2510 Atherosclerotic heart disease of native coronary artery without angina pectoris: Secondary | ICD-10-CM | POA: Diagnosis not present

## 2016-11-25 DIAGNOSIS — I509 Heart failure, unspecified: Secondary | ICD-10-CM | POA: Diagnosis not present

## 2016-11-25 DIAGNOSIS — G8929 Other chronic pain: Secondary | ICD-10-CM | POA: Diagnosis not present

## 2016-11-25 DIAGNOSIS — H547 Unspecified visual loss: Secondary | ICD-10-CM | POA: Diagnosis not present

## 2016-11-25 DIAGNOSIS — K219 Gastro-esophageal reflux disease without esophagitis: Secondary | ICD-10-CM | POA: Diagnosis not present

## 2016-12-30 ENCOUNTER — Ambulatory Visit (INDEPENDENT_AMBULATORY_CARE_PROVIDER_SITE_OTHER): Payer: Medicare PPO | Admitting: Sports Medicine

## 2016-12-30 ENCOUNTER — Encounter: Payer: Self-pay | Admitting: Sports Medicine

## 2016-12-30 DIAGNOSIS — R7303 Prediabetes: Secondary | ICD-10-CM

## 2016-12-30 DIAGNOSIS — M79675 Pain in left toe(s): Secondary | ICD-10-CM

## 2016-12-30 DIAGNOSIS — M79674 Pain in right toe(s): Secondary | ICD-10-CM | POA: Diagnosis not present

## 2016-12-30 DIAGNOSIS — Z7901 Long term (current) use of anticoagulants: Secondary | ICD-10-CM

## 2016-12-30 DIAGNOSIS — B351 Tinea unguium: Secondary | ICD-10-CM | POA: Diagnosis not present

## 2016-12-30 DIAGNOSIS — G629 Polyneuropathy, unspecified: Secondary | ICD-10-CM | POA: Diagnosis not present

## 2016-12-30 NOTE — Progress Notes (Signed)
Patient ID: Mary Cline, female   DOB: 07/20/1922, 83 y.o.   MRN: [id number]  Subjective: Mary Cline is a 83 y.o. female patient seen today in office with complaint of painful thickened and elongated toenails; unable to trim. Patient denies any changes since last visit with medical problems. Patient has no other pedal complaints at this time.   Patient Active Problem List   Diagnosis Date Noted  . Chronic systolic heart failure ([place]) 04/03/2015  . Essential hypertension 04/03/2015  . Hyperlipidemia 04/03/2015  . Retinal vascular occlusion, unspecified 11/10/2012  . Giant cell arteritis ([place]) 11/10/2012  . Headache(784.0) 11/08/2012  . Visual changes 11/08/2012    Current Outpatient Prescriptions on File Prior to Visit  Medication Sig Dispense Refill  . aspirin 81 MG tablet Take 81 mg by mouth daily.    . carvedilol (COREG) 3.125 MG tablet Take 1 tablet (3.125 mg total) by mouth 2 (two) times daily with a meal. 180 tablet 3  . ezetimibe (ZETIA) 10 MG tablet Take 1 tablet (10 mg total) by mouth daily. 90 tablet 3  . furosemide (LASIX) 20 MG tablet Take 1 tablet (20 mg total) by mouth as needed. For weight gain more that 2 pounds in a day or 5 pounds in a week. 90 tablet 3  . glycerin adult 2 G SUPP Place 1 suppository rectally once as needed for moderate constipation.    . Multiple Vitamin (MULTIVITAMIN) tablet Take 1 tablet by mouth daily.    . NONFORMULARY OR COMPOUNDED ITEM Ketoconazole and fluticasone compounded cream.  Apply as needed to affected areas as needed as directed.    Marland Kitchen omeprazole (PRILOSEC) 40 MG capsule Take 40 mg by mouth daily.    . sacubitril-valsartan (ENTRESTO) 49-51 MG Take 1 tablet by mouth 2 (two) times daily. 180 tablet 3  . traMADol (ULTRAM) 50 MG tablet Take 1 tablet (50 mg total) by mouth 3 (three) times daily as needed for severe pain. 20 tablet 0  . Wheat Dextrin (BENEFIBER) POWD Take 2 scoop by mouth daily.     No current  facility-administered medications on file prior to visit.     Allergies  Allergen Reactions  . Fish Allergy Anaphylaxis  . Iodine Anaphylaxis  . Acth [Corticotropin] Other (See Comments)    Confusion   . Actonel [Risedronate Sodium] Nausea And Vomiting  . Atorvastatin Other (See Comments)    Blisters in mouth.    . Ciprofloxacin Nausea And Vomiting  . Codeine Other (See Comments)    Headache   . Demerol [Meperidine] Other (See Comments)    Hypotension   . Garamycin [Gentamicin Sulfate] Rash  . Lactose Intolerance (Gi) Other (See Comments)    Intolerance.    . Lovastatin Other (See Comments)    blisters  . Macrobid [Nitrofurantoin Monohyd Macro] Nausea And Vomiting  . Other Hives and Swelling    Melons and strawberries.    . Paxil [Paroxetine Hcl] Nausea Only  . Penicillins Other (See Comments)    Pain.    . Sulfa Antibiotics Swelling  . Tetracyclines & Related Other (See Comments)    Stomach pain  . Vioxx [Rofecoxib] Other (See Comments)    Not known  . Vitamin D Analogs Other (See Comments)    Dizzy confused    Objective: Physical Exam  General: Well developed, nourished, no acute distress, awake, alert and oriented x 3  Vascular: Dorsalis pedis artery 1/4 bilateral, Posterior tibial artery 1/4 bilateral, skin temperature warm to warm proximal to distal  bilateral lower extremities, mild varicosities, decreased pedal hair present bilateral.  Neurological: Gross sensation present via light touch bilateral. Subjective tingling to toes, likely positional neuropathy  Dermatological: Skin is warm, dry, and supple bilateral, Nails 1-10 are tender, long, thick, and discolored with mild subungal debris, no webspace macerations present bilateral, no open lesions present bilateral, no callus/corns/hyperkeratotic tissue present bilateral. No signs of infection bilateral.  Musculoskeletal: Bunion deformities noted bilateral and lesser hammertoe that is asymptomatic. Muscular  strength within normal limits without pain on range of motion. No pain with calf compression bilateral.  Assessment and Plan:  Problem List Items Addressed This Visit    None    Visit Diagnoses    Dermatophytosis of nail    -  Primary   Pre-diabetes       Neuropathy       Toe pain, bilateral       Long term (current) use of anticoagulants         -Examined patient.  -Discussed treatment options for painful mycotic nails. -Mechanically debrided and reduced mycotic nails with sterile nail nipper and dremel nail file without incident. -Continue with toe spacer to prevent irritation at bunion onto 2nd toe -Recommend Continue with capsaicin cream to use as needed for tingling in toes, Just prior to bedtime.  -Patient to return in 2.5 months/as needed for follow up evaluation or sooner if symptoms worsen.  [name], DPM

## 2017-02-16 ENCOUNTER — Telehealth: Payer: Self-pay | Admitting: Cardiovascular Disease

## 2017-02-16 MED ORDER — IRBESARTAN 150 MG PO TABS: 150.0000 mg | ORAL_TABLET | Freq: Every day | ORAL | 3 refills | Status: DC

## 2017-02-16 NOTE — Telephone Encounter (Signed)
Agree, Mary Cline is not involved in the recalls.  However, if the patient assistance programs are not working out, stop this medication and start irbesartan 150mg  daily instead.  Track BP and call if consistently <100 or >300 mmHg systolic.

## 2017-02-16 NOTE — Telephone Encounter (Signed)
Tomi Bamberger( daughter) is calling concerning her mother medication Mary Cline  49 /51 mg ) would like to take her off of this medication because its not affordable and because of the cancer scare . Please call

## 2017-02-16 NOTE — Telephone Encounter (Signed)
Advised daughter, verbalized understanding  

## 2017-02-16 NOTE — Telephone Encounter (Signed)
Returned call to daughter (listed DPR). She notes pt has problem affording Entresto & has gone through [employer] patient assistance and various channels to try to get this medication more cheaply - cannot continue to pay $500 per month.  She also expressed concern regarding the "cancer scare" related to Valsartan - I did advise her that Delene Loll was not affected by the valsartan FDA product recall.  She still wishes to change medication or "find another doctor who will cooperate with [place]".   Informed her I would route to Dr. Oval Linsey for med review. Pt currently has 11 days left of medication and I advised to continue current medical therapy.

## 2017-03-10 ENCOUNTER — Ambulatory Visit: Payer: Medicare PPO | Admitting: Sports Medicine

## 2017-03-10 ENCOUNTER — Encounter: Payer: Self-pay | Admitting: Sports Medicine

## 2017-03-10 DIAGNOSIS — B351 Tinea unguium: Secondary | ICD-10-CM

## 2017-03-10 DIAGNOSIS — G629 Polyneuropathy, unspecified: Secondary | ICD-10-CM

## 2017-03-10 DIAGNOSIS — Z7901 Long term (current) use of anticoagulants: Secondary | ICD-10-CM

## 2017-03-10 DIAGNOSIS — M79674 Pain in right toe(s): Secondary | ICD-10-CM

## 2017-03-10 DIAGNOSIS — R7303 Prediabetes: Secondary | ICD-10-CM

## 2017-03-10 DIAGNOSIS — M79675 Pain in left toe(s): Secondary | ICD-10-CM

## 2017-03-10 NOTE — Progress Notes (Signed)
Patient ID: PHALLON HAYDU, female   DOB: Nov 18, 1922, 83 y.o.   MRN: [id number]  Subjective: Mary Cline is a 83 y.o. female patient seen today in office with complaint of painful thickened and elongated toenails; unable to trim. Patient denies any changes since last visit with medical problems. Patient has no other pedal complaints at this time.   Patient Active Problem List   Diagnosis Date Noted  . Chronic systolic heart failure ([place]) 04/03/2015  . Essential hypertension 04/03/2015  . Hyperlipidemia 04/03/2015  . Retinal vascular occlusion, unspecified 11/10/2012  . Giant cell arteritis ([place]) 11/10/2012  . Headache(784.0) 11/08/2012  . Visual changes 11/08/2012    Current Outpatient Medications on File Prior to Visit  Medication Sig Dispense Refill  . aspirin 81 MG tablet Take 81 mg by mouth daily.    . carvedilol (COREG) 3.125 MG tablet Take 1 tablet (3.125 mg total) by mouth 2 (two) times daily with a meal. 180 tablet 3  . furosemide (LASIX) 20 MG tablet Take 1 tablet (20 mg total) by mouth as needed. For weight gain more that 2 pounds in a day or 5 pounds in a week. 90 tablet 3  . glycerin adult 2 G SUPP Place 1 suppository rectally once as needed for moderate constipation.    . irbesartan (AVAPRO) 150 MG tablet Take 1 tablet (150 mg total) daily by mouth. 90 tablet 3  . Multiple Vitamin (MULTIVITAMIN) tablet Take 1 tablet by mouth daily.    . NONFORMULARY OR COMPOUNDED ITEM Ketoconazole and fluticasone compounded cream.  Apply as needed to affected areas as needed as directed.    Marland Kitchen omeprazole (PRILOSEC) 40 MG capsule Take 40 mg by mouth daily.    . sacubitril-valsartan (ENTRESTO) 49-51 MG Take 1 tablet by mouth 2 (two) times daily. 180 tablet 3  . traMADol (ULTRAM) 50 MG tablet Take 1 tablet (50 mg total) by mouth 3 (three) times daily as needed for severe pain. 20 tablet 0  . Wheat Dextrin (BENEFIBER) POWD Take 2 scoop by mouth daily.    Marland Kitchen ezetimibe (ZETIA) 10 MG  tablet Take 1 tablet (10 mg total) by mouth daily. 90 tablet 3   No current facility-administered medications on file prior to visit.     Allergies  Allergen Reactions  . Fish Allergy Anaphylaxis  . Iodine Anaphylaxis  . Acth [Corticotropin] Other (See Comments)    Confusion   . Actonel [Risedronate Sodium] Nausea And Vomiting  . Atorvastatin Other (See Comments)    Blisters in mouth.    . Ciprofloxacin Nausea And Vomiting  . Codeine Other (See Comments)    Headache   . Demerol [Meperidine] Other (See Comments)    Hypotension   . Garamycin [Gentamicin Sulfate] Rash  . Lactose Intolerance (Gi) Other (See Comments)    Intolerance.    . Lovastatin Other (See Comments)    blisters  . Macrobid [Nitrofurantoin Monohyd Macro] Nausea And Vomiting  . Other Hives and Swelling    Melons and strawberries.    . Paxil [Paroxetine Hcl] Nausea Only  . Penicillins Other (See Comments)    Pain.    . Sulfa Antibiotics Swelling  . Tetracyclines & Related Other (See Comments)    Stomach pain  . Vioxx [Rofecoxib] Other (See Comments)    Not known  . Vitamin D Analogs Other (See Comments)    Dizzy confused    Objective: Physical Exam  General: Well developed, nourished, no acute distress, awake, alert and oriented x 3  Vascular: Dorsalis pedis artery 1/4 bilateral, Posterior tibial artery 1/4 bilateral, skin temperature warm to warm proximal to distal bilateral lower extremities, mild varicosities, decreased pedal hair present bilateral.  Neurological: Gross sensation present via light touch bilateral. Subjective tingling to toes, likely positional neuropathy  Dermatological: Skin is warm, dry, and supple bilateral, Nails 1-10 are tender, long, thick, and discolored with mild subungal debris, no webspace macerations present bilateral, no open lesions present bilateral, no callus/corns/hyperkeratotic tissue present bilateral. No signs of infection bilateral.  Musculoskeletal: Bunion  deformities noted bilateral and lesser hammertoe that is asymptomatic. Muscular strength within normal limits without pain on range of motion. No pain with calf compression bilateral.  Assessment and Plan:  Problem List Items Addressed This Visit    None    Visit Diagnoses    Dermatophytosis of nail    -  Primary   Pre-diabetes       Neuropathy       Toe pain, bilateral       Long term (current) use of anticoagulants         -Examined patient.  -Discussed treatment options for painful mycotic nails. -Mechanically debrided and reduced mycotic nails with sterile nail nipper and dremel nail file without incident. -Continue with toe spacer to prevent irritation at bunion onto 2nd toe -Recommend Continue with capsaicin cream to use as needed for tingling in toes, Just prior to bedtime.  -Patient to return in 2.5 months/as needed for follow up evaluation or sooner if symptoms worsen.  [name], DPM

## 2017-04-07 DIAGNOSIS — D519 Vitamin B12 deficiency anemia, unspecified: Secondary | ICD-10-CM | POA: Diagnosis not present

## 2017-04-07 DIAGNOSIS — M1990 Unspecified osteoarthritis, unspecified site: Secondary | ICD-10-CM | POA: Diagnosis not present

## 2017-04-07 DIAGNOSIS — E559 Vitamin D deficiency, unspecified: Secondary | ICD-10-CM | POA: Diagnosis not present

## 2017-04-07 DIAGNOSIS — I10 Essential (primary) hypertension: Secondary | ICD-10-CM | POA: Diagnosis not present

## 2017-04-07 DIAGNOSIS — G8929 Other chronic pain: Secondary | ICD-10-CM | POA: Diagnosis not present

## 2017-04-07 DIAGNOSIS — M720 Palmar fascial fibromatosis [Dupuytren]: Secondary | ICD-10-CM | POA: Diagnosis not present

## 2017-04-07 DIAGNOSIS — E1140 Type 2 diabetes mellitus with diabetic neuropathy, unspecified: Secondary | ICD-10-CM | POA: Diagnosis not present

## 2017-04-07 DIAGNOSIS — I509 Heart failure, unspecified: Secondary | ICD-10-CM | POA: Diagnosis not present

## 2017-04-07 DIAGNOSIS — M810 Age-related osteoporosis without current pathological fracture: Secondary | ICD-10-CM | POA: Diagnosis not present

## 2017-04-08 NOTE — Progress Notes (Signed)
Mary Cline    HEMATOLOGY/ONCOLOGY CLINIC NOTE  Date of Service: 04/14/2017    Patient Care Team: Rankins, Bill Salinas, MD as PCP - General (Family Medicine)  CHIEF COMPLAINTS/PURPOSE OF CONSULTATION:  F/u for CLL  HISTORY OF PRESENTING ILLNESS:   Mary Cline is a wonderful 84 y.o. female who has been referred to [place] by Dr [name], Bill Salinas, MD  for evaluation and management of lymphocytosis.  Patient has a history of hypertension, diabetes, dyslipidemia, chronic systolic CHF who on recent labs with her primary care physician on 10/16/2015 was noted to have leukocytosis with WBC count of 12.6k with a lymphocyte count of 9.5k and normal hemoglobin of 12.6 with an MCV of 93 and mild thrombocytopenia of 139k.   She had a review of her peripheral blood smear by pathology-day showed absolute lymphocytosis and atypical lymphocytes.  Patient was referred to [place] for further evaluation and management of her lymphocytosis. She reports no fevers no chills no night sweats no unintentional weight loss. Borderline enlarged lymph nodes in left upper neck. No abdominal pain or distention.  Patient notes no other acute new symptoms in the last 3-6 months. Labs were done for routine follow-up. Patient is here with her daughter who helps with providing information as well.  Labs done in clinic today showed a CBC with a hemoglobin within normal limits at 12.7, platelet count slightly decreased at 125k, total WBC count of 9.8k with 7.7k lymphocytes.  INTERVAL HISTORY   Mary Cline is here for her scheduled follow up for CLL. She presents to the clinic today accompanied by her daughter.  She notes her daughter and son-in-law will move in with her. She notes no new concerns. She notes her limited diet is not her favorite but has to stick to it to avoid getting sick with indigestions. She is overall doing well. She see her PCP every 6 months, last visit was 03/2017. Her daughter notes the pt's hearing  aids are not working as well currently as they use to.   On review of symptoms, pt denies fever, chills, night sweats unexpected weight loss or fatigue. She denies lump or bumps or new bone pain or acute new symptoms.  She notes to having the usual arthritis in her back and hands.     MEDICAL HISTORY:  Past Medical History:  Diagnosis Date  . Anemia   . Arthritis   . Chronic systolic heart failure ([place]) 04/03/2015  . Diabetes mellitus   . Essential hypertension 04/03/2015  . GERD (gastroesophageal reflux disease)   . Hernia   . Hyperlipidemia 04/03/2015  . Hypertension   . Macular degeneration disease     SURGICAL HISTORY: Past Surgical History:  Procedure Laterality Date  . ANGIOPLASTY    . APPENDECTOMY    . HYSTEROTOMY      SOCIAL HISTORY: Social History   Socioeconomic History  . Marital status: Widowed    Spouse name: Not on file  . Number of children: 4  . Years of education: Therapist, sports   . Highest education level: Not on file  Social Needs  . Financial resource strain: Not on file  . Food insecurity - worry: Not on file  . Food insecurity - inability: Not on file  . Transportation needs - medical: Not on file  . Transportation needs - non-medical: Not on file  Occupational History  . Not on file  Tobacco Use  . Smoking status: Never Smoker  . Smokeless tobacco: Never Used  Substance and Sexual  Activity  . Alcohol use: No  . Drug use: No  . Sexual activity: Not on file  Other Topics Concern  . Not on file  Social History Narrative   Patient lives at home with her daughter.    Patient is retired.    Patient is a Education administrator.    Patient was an RN    Patient has 4 children.             Epworth Sleepiness Scale = 10 (as of 04/03/2015)    FAMILY HISTORY: Family History  Problem Relation Age of Onset  . Cancer Sister        Breast cancer  . Diabetes Brother   . Diabetes Maternal Grandmother   . Heart Problems Maternal Grandfather   . Heart Problems Paternal  Grandmother   . Heart Problems Paternal Grandfather   . Diabetes Brother   . Diabetes Brother   . Heart Problems Daughter   . Heart Problems Daughter     ALLERGIES:  is allergic to fish allergy; iodine; acth [corticotropin]; actonel [risedronate sodium]; atorvastatin; ciprofloxacin; codeine; demerol [meperidine]; garamycin [gentamicin sulfate]; lactose intolerance (gi); lovastatin; macrobid [nitrofurantoin monohyd macro]; other; paxil [paroxetine hcl]; penicillins; sulfa antibiotics; tetracyclines & related; vioxx [rofecoxib]; and vitamin d analogs.  MEDICATIONS:   Current Outpatient Medications  Medication Sig Dispense Refill  . aspirin 81 MG tablet Take 81 mg by mouth daily.    . carvedilol (COREG) 3.125 MG tablet Take 1 tablet (3.125 mg total) by mouth 2 (two) times daily with a meal. 180 tablet 3  . furosemide (LASIX) 20 MG tablet Take 1 tablet (20 mg total) by mouth as needed. For weight gain more that 2 pounds in a day or 5 pounds in a week. 90 tablet 3  . glycerin adult 2 G SUPP Place 1 suppository rectally once as needed for moderate constipation.    . irbesartan (AVAPRO) 150 MG tablet Take 1 tablet (150 mg total) daily by mouth. 90 tablet 3  . Magnesium Hydroxide (EQ MILK OF MAGNESIA PO) Take by mouth as needed (PRN for no bowel movement x 2 days).    . Multiple Vitamin (MULTIVITAMIN) tablet Take 1 tablet by mouth daily.    . NONFORMULARY OR COMPOUNDED ITEM Ketoconazole and fluticasone compounded cream.  Apply as needed to affected areas as needed as directed.    Mary Cline omeprazole (PRILOSEC) 40 MG capsule Take 40 mg by mouth daily.    . traMADol (ULTRAM) 50 MG tablet Take 1 tablet (50 mg total) by mouth 3 (three) times daily as needed for severe pain. 20 tablet 0  . ezetimibe (ZETIA) 10 MG tablet Take 1 tablet (10 mg total) by mouth daily. 90 tablet 3   No current facility-administered medications for this visit.     REVIEW OF SYSTEMS:    10 Point review of Systems was done is  negative except as noted above.  PHYSICAL EXAMINATION: ECOG PERFORMANCE STATUS: 2 - Symptomatic, <50% confined to bed  . Vitals:   04/14/17 1431  BP: 138/76  Pulse: 72  Resp: 18  Temp: 98.6 F (37 C)  SpO2: 97%   Filed Weights   04/14/17 1431  Weight: 93 lb 6.4 oz (42.4 kg)   .Body mass index is 18.24 kg/m.  GENERAL:alert, in no acute distress and comfortable SKIN: skin color, texture, turgor are normal, no rashes or significant lesions EYES: normal, conjunctiva are pink and non-injected, sclera clear OROPHARYNX:no exudate, no erythema and lips, buccal mucosa, and tongue normal  NECK: supple, no JVD, thyroid normal size, non-tender, without nodularity LYMPH:  no palpable lymphadenopathy in the axillary or inguinal, small left upper cervical lymph node. LUNGS: clear to auscultation with normal respiratory effort HEART: regular rate & rhythm,  no murmurs and no lower extremity edema ABDOMEN: abdomen soft, non-tender, normoactive bowel sounds , no palpable hepatosplenomegaly Musculoskeletal: no cyanosis of digits and no clubbing  PSYCH: alert & oriented x 3 with fluent speech NEURO: no focal motor/sensory deficits  LABORATORY DATA:  I have reviewed the data as listed  . CBC Latest Ref Rng & Units 04/14/2017 10/14/2016 04/01/2016  WBC 3.9 - 10.3 K/uL 15.6(H) 9.6 8.1  Hemoglobin 11.6 - 15.9 g/dL - 12.4 12.1  Hematocrit 34.8 - 46.6 % 36.4 37.5 36.5  Platelets 145 - 400 K/uL 130(L) 142(L) 151   . CBC    Component Value Date/Time   WBC 15.6 (H) 04/14/2017 1414   WBC 9.6 10/14/2016 1404   WBC 12.6 (H) 10/16/2015 1011   RBC 3.79 04/14/2017 1414   RBC 3.79 04/14/2017 1414   HGB 12.4 10/14/2016 1404   HCT 36.4 04/14/2017 1414   HCT 37.5 10/14/2016 1404   PLT 130 (L) 04/14/2017 1414   PLT 142 (L) 10/14/2016 1404   MCV 96.0 04/14/2017 1414   MCV 94.6 10/14/2016 1404   MCH 30.9 04/14/2017 1414   MCHC 32.1 04/14/2017 1414   RDW 13.0 04/14/2017 1414   RDW 13.5 10/14/2016  1404   LYMPHSABS 12.5 (H) 04/14/2017 1414   LYMPHSABS 6.8 (H) 10/14/2016 1404   MONOABS 0.4 04/14/2017 1414   MONOABS 0.3 10/14/2016 1404   EOSABS 0.1 04/14/2017 1414   EOSABS 0.1 10/14/2016 1404   BASOSABS 0.0 04/14/2017 1414   BASOSABS 0.0 10/14/2016 1404    . CMP Latest Ref Rng & Units 04/14/2017 10/14/2016 04/01/2016  Glucose 70 - 140 mg/dL 108 98 98  BUN 7 - 26 mg/dL 24 21.4 19.3  Creatinine 0.60 - 1.10 mg/dL 1.21(H) 1.0 1.0  Sodium 136 - 145 mmol/L 140 142 142  Potassium 3.3 - 4.7 mmol/L 5.0(H) 4.6 4.3  Chloride 98 - 109 mmol/L 104 - -  CO2 22 - 29 mmol/L 30(H) 28 26  Calcium 8.4 - 10.4 mg/dL 9.1 9.2 9.4  Total Protein 6.4 - 8.3 g/dL 6.3(L) 6.3(L) 6.7  Total Bilirubin 0.2 - 1.2 mg/dL 0.5 0.46 0.56  Alkaline Phos 40 - 150 U/L 70 61 78  AST 5 - 34 U/L 15 15 14   ALT 0 - 55 U/L 14 10 7          RADIOGRAPHIC STUDIES: I have personally reviewed the radiological images as listed and agreed with the findings in the report. No results found.  ASSESSMENT & PLAN:   84 year old Caucasian female with  #1 Rai Stage 1 CLL -- currently no indications for treatment. Biallelic deletion of 03J Labs stable No significant anemia at this time Mild thrombocytopenia 130k . No bleeding. Lymphocyte counts stable- 9.4k --> 5.3k ---> 6.8k has increased to 12.5K as of 04/14/17  PLAN  -Patient currently has no constitutional symptoms no significant anemia or thrombocytopenia no significant lymphadenopathy or symptomatic hepatosplenomegaly at this time.  -No clear indication for treatment of the patient's CLL at this time. -CLL Prognostic FISH panel -- reviewed with the patient -She will continue to follow with her primary care physician for management of her other multiple medical comorbidities. -As her labs continue to be overall stable she can alternate her 6 months visit with her PCP, Dr.  Rankins, and our clinic. If any acute concerns occur we can see her sooner.   F/u with PCP with labs  in 6 months RTC with Dr. Irene Limbo in 12 months with CBC, CMP, LDH   All of the patients and her daughters questions were answered with apparent satisfaction. The patient knows to call the clinic with any problems, questions or concerns.  I spent 20 minutes counseling the patient face to face. The total time spent in the appointment was 20 minutes and more than 50% was on counseling and direct patient cares.    Sullivan Lone MD [place] AAHIVMS [facility] Hematology/Oncology Physician [place]  (Office):       [phone] (Work cell):  [phone] (Fax):           [phone]  This document serves as a record of services personally performed by Sullivan Lone, MD. It was created on his behalf by Joslyn Devon, a trained medical scribe. The creation of this record is based on the scribe's personal observations and the provider's statements to them.   .I have reviewed the above documentation for accuracy and completeness, and I agree with the above. Brunetta Genera MD MS

## 2017-04-14 ENCOUNTER — Telehealth: Payer: Self-pay | Admitting: Hematology

## 2017-04-14 ENCOUNTER — Encounter: Payer: Self-pay | Admitting: Hematology

## 2017-04-14 ENCOUNTER — Inpatient Hospital Stay: Payer: Medicare PPO

## 2017-04-14 ENCOUNTER — Inpatient Hospital Stay: Payer: Medicare PPO | Attending: Hematology | Admitting: Hematology

## 2017-04-14 VITALS — BP 138/76 | HR 72 | Temp 98.6°F | Resp 18 | Ht 60.0 in | Wt 93.4 lb

## 2017-04-14 DIAGNOSIS — E785 Hyperlipidemia, unspecified: Secondary | ICD-10-CM | POA: Insufficient documentation

## 2017-04-14 DIAGNOSIS — E119 Type 2 diabetes mellitus without complications: Secondary | ICD-10-CM | POA: Diagnosis not present

## 2017-04-14 DIAGNOSIS — H3530 Unspecified macular degeneration: Secondary | ICD-10-CM | POA: Insufficient documentation

## 2017-04-14 DIAGNOSIS — C9110 Chronic lymphocytic leukemia of B-cell type not having achieved remission: Secondary | ICD-10-CM | POA: Diagnosis not present

## 2017-04-14 DIAGNOSIS — I110 Hypertensive heart disease with heart failure: Secondary | ICD-10-CM | POA: Insufficient documentation

## 2017-04-14 DIAGNOSIS — Z79899 Other long term (current) drug therapy: Secondary | ICD-10-CM | POA: Insufficient documentation

## 2017-04-14 DIAGNOSIS — D696 Thrombocytopenia, unspecified: Secondary | ICD-10-CM | POA: Insufficient documentation

## 2017-04-14 DIAGNOSIS — Z7982 Long term (current) use of aspirin: Secondary | ICD-10-CM | POA: Insufficient documentation

## 2017-04-14 DIAGNOSIS — M1990 Unspecified osteoarthritis, unspecified site: Secondary | ICD-10-CM | POA: Diagnosis not present

## 2017-04-14 DIAGNOSIS — K219 Gastro-esophageal reflux disease without esophagitis: Secondary | ICD-10-CM | POA: Diagnosis not present

## 2017-04-14 LAB — CBC WITH DIFFERENTIAL (CANCER CENTER ONLY)
BASOS ABS: 0 10*3/uL (ref 0.0–0.1)
Basophils Relative: 0 %
Eosinophils Absolute: 0.1 10*3/uL (ref 0.0–0.5)
Eosinophils Relative: 1 %
HEMATOCRIT: 36.4 % (ref 34.8–46.6)
Hemoglobin: 11.7 g/dL (ref 11.6–15.9)
LYMPHS ABS: 12.5 10*3/uL — AB (ref 0.9–3.3)
LYMPHS PCT: 80 %
MCH: 30.9 pg (ref 25.1–34.0)
MCHC: 32.1 g/dL (ref 31.5–36.0)
MCV: 96 fL (ref 79.5–101.0)
Monocytes Absolute: 0.4 10*3/uL (ref 0.1–0.9)
Monocytes Relative: 3 %
Neutro Abs: 2.6 10*3/uL (ref 1.5–6.5)
Neutrophils Relative %: 16 %
Platelet Count: 130 10*3/uL — ABNORMAL LOW (ref 145–400)
RBC: 3.79 MIL/uL (ref 3.70–5.45)
RDW: 13 % (ref 11.2–16.1)
WBC Count: 15.6 10*3/uL — ABNORMAL HIGH (ref 3.9–10.3)

## 2017-04-14 LAB — COMPREHENSIVE METABOLIC PANEL
ALT: 14 U/L (ref 0–55)
AST: 15 U/L (ref 5–34)
Albumin: 3.6 g/dL (ref 3.5–5.0)
Alkaline Phosphatase: 70 U/L (ref 40–150)
Anion gap: 6 (ref 3–11)
BUN: 24 mg/dL (ref 7–26)
CHLORIDE: 104 mmol/L (ref 98–109)
CO2: 30 mmol/L — AB (ref 22–29)
CREATININE: 1.21 mg/dL — AB (ref 0.60–1.10)
Calcium: 9.1 mg/dL (ref 8.4–10.4)
GFR calc non Af Amer: 38 mL/min — ABNORMAL LOW (ref >60)
GFR, EST AFRICAN AMERICAN: 44 mL/min — AB (ref >60)
Glucose, Bld: 108 mg/dL (ref 70–140)
Potassium: 5 mmol/L — ABNORMAL HIGH (ref 3.3–4.7)
SODIUM: 140 mmol/L (ref 136–145)
Total Bilirubin: 0.5 mg/dL (ref 0.2–1.2)
Total Protein: 6.3 g/dL — ABNORMAL LOW (ref 6.4–8.3)

## 2017-04-14 LAB — RETICULOCYTES
RBC.: 3.79 MIL/uL (ref 3.70–5.45)
Retic Count, Absolute: 56.9 10*3/uL (ref 33.7–90.7)
Retic Ct Pct: 1.5 % (ref 0.7–2.1)

## 2017-04-14 NOTE — Telephone Encounter (Signed)
Gave avs and calendar for January 2020 °

## 2017-04-27 ENCOUNTER — Ambulatory Visit: Payer: Medicare PPO | Admitting: Cardiovascular Disease

## 2017-04-27 ENCOUNTER — Encounter: Payer: Self-pay | Admitting: Cardiovascular Disease

## 2017-04-27 VITALS — BP 130/68 | HR 68 | Ht 59.0 in | Wt 95.4 lb

## 2017-04-27 DIAGNOSIS — E7800 Pure hypercholesterolemia, unspecified: Secondary | ICD-10-CM

## 2017-04-27 DIAGNOSIS — I5042 Chronic combined systolic (congestive) and diastolic (congestive) heart failure: Secondary | ICD-10-CM | POA: Diagnosis not present

## 2017-04-27 DIAGNOSIS — I10 Essential (primary) hypertension: Secondary | ICD-10-CM

## 2017-04-27 DIAGNOSIS — I2510 Atherosclerotic heart disease of native coronary artery without angina pectoris: Secondary | ICD-10-CM

## 2017-04-27 NOTE — Progress Notes (Signed)
Cardiology Office Note   Date:  04/27/2017   ID:  Mary Cline, DOB 01/28/1923, MRN [id number]  PCP:  [name], MD  Cardiologist:   Skeet Latch, MD   Chief Complaint  Patient presents with  . Follow-up   History of Present Illness: Mary Cline is a 84 y.o. female with hypertension, diabetes, GERD, giant cell arteritis, CLL, chronic systolic heart failure LVEF 15-20%, and LBBB here for follow up.  Mary Cline was seen by [name] (cardiolgist, [facility]) on 11/2014.  At that time she was following up after a hospitalization for systolic heart failure that was likely ischemic.  She was initially hospitalized for shortness of breath and was found to have an NSTEMI.  Troponin peaked at 0.17.  BNP was 33,000. EKG revealed LBBB and no prior EKGs were available for comparison.  Her symptoms improved with diuresis.   Her family decided not to pursue aggressive measures given her age. Mary Cline has been unable to get financial assistance to afford Entresto.  She was switched to irbesartan.    Since her last appointment Mary Cline has been feeling well.  She denies chest pain or shortness of breath.  She has not noted any lower extremity edema, orthopnea, or PND.  Her main complaint is pain in her legs when she lays down.  She also has contractures of bilateral hands.  She has been told that given her comorbidities she is not a candidate for surgery.  She brings a log of her blood pressures that have ranged from the 110s to the 150s.  For the most part they have been well-controlled.  In the last week her daughter and son-in-law have moved into the home and she has been slightly more anxious.  Her blood pressures been a little bit higher lately.     Past Medical History:  Diagnosis Date  . Anemia   . Arthritis   . Chronic systolic heart failure ([place]) 04/03/2015  . Diabetes mellitus   . Essential hypertension 04/03/2015  . GERD (gastroesophageal  reflux disease)   . Hernia   . Hyperlipidemia 04/03/2015  . Hypertension   . Macular degeneration disease     Past Surgical History:  Procedure Laterality Date  . ANGIOPLASTY    . APPENDECTOMY    . HYSTEROTOMY       Current Outpatient Medications  Medication Sig Dispense Refill  . aspirin 81 MG tablet Take 81 mg by mouth daily.    . carvedilol (COREG) 3.125 MG tablet Take 1 tablet (3.125 mg total) by mouth 2 (two) times daily with a meal. 180 tablet 3  . furosemide (LASIX) 20 MG tablet Take 1 tablet (20 mg total) by mouth as needed. For weight gain more that 2 pounds in a day or 5 pounds in a week. 90 tablet 3  . glycerin adult 2 G SUPP Place 1 suppository rectally once as needed for moderate constipation.    . irbesartan (AVAPRO) 150 MG tablet Take 1 tablet (150 mg total) daily by mouth. 90 tablet 3  . Magnesium Hydroxide (EQ MILK OF MAGNESIA PO) Take by mouth as needed (PRN for no bowel movement x 2 days).    . Multiple Vitamin (MULTIVITAMIN) tablet Take 1 tablet by mouth daily.    . NONFORMULARY OR COMPOUNDED ITEM Ketoconazole and fluticasone compounded cream.  Apply as needed to affected areas as needed as directed.    Marland Kitchen omeprazole (PRILOSEC) 40 MG capsule Take 40 mg by  mouth daily.    . traMADol (ULTRAM) 50 MG tablet Take 1 tablet (50 mg total) by mouth 3 (three) times daily as needed for severe pain. 20 tablet 0   No current facility-administered medications for this visit.     Allergies:   Fish allergy; Iodine; Acth [corticotropin]; Actonel [risedronate sodium]; Atorvastatin; Ciprofloxacin; Codeine; Demerol [meperidine]; Garamycin [gentamicin sulfate]; Lactose intolerance (gi); Lovastatin; Macrobid [nitrofurantoin monohyd macro]; Other; Paxil [paroxetine hcl]; Penicillins; Sulfa antibiotics; Tetracyclines & related; Vioxx [rofecoxib]; and Vitamin d analogs    Social History:  The patient  reports that  has never smoked. she has never used smokeless tobacco. She reports that she  does not drink alcohol or use drugs.   Family History:  The patient's family history includes Cancer in her sister; Diabetes in her brother, brother, brother, and maternal grandmother; Heart Problems in her daughter, daughter, maternal grandfather, paternal grandfather, and paternal grandmother.    ROS:  Please see the history of present illness.   Otherwise, review of systems are positive for pain in feet and memory loss.   All other systems are reviewed and negative.    PHYSICAL EXAM: VS:  BP 130/68   Pulse 68   Ht 4\' 11"  (1.499 m)   Wt 95 lb 6.4 oz (43.3 kg)   BMI 19.27 kg/m  , BMI Body mass index is 19.27 kg/m. GENERAL:  Well appearing, frail, elderly woman in no acute distress. HEENT: Pupils equal round and reactive, fundi not visualized, oral mucosa unremarkable NECK:  No jugular venous distention, waveform within normal limits, carotid upstroke brisk and symmetric, no bruits, no thyromegaly LYMPHATICS:  No cervical adenopathy LUNGS:  Clear to auscultation bilaterally HEART:  RRR.  PMI not displaced or sustained,S1 and S2 within normal limits, no S3, no S4, no clicks, no rubs, II/VI mid-peaking systolic murmur at the LUSB ABD:  Flat, positive bowel sounds normal in frequency in pitch, no bruits, no rebound, no guarding, no midline pulsatile mass, no hepatomegaly, no splenomegaly EXT:  2 plus pulses throughout, no edema, no cyanosis no clubbing.  Contracture of bilateral 3rd digits. SKIN:  No rashes no nodules NEURO:  Cranial nerves II through XII grossly intact, motor grossly intact throughout PSYCH:  Cognitively intact, oriented to person place and time   EKG:  EKG is ordered today. 11/23/15: Sinus rhythm.  Rate 76 bpm.  LBBB. The ekg ordered 04/03/15 demonstrates sinus rhythm.  LBBB.  10/07/16: Sinus rhythm rate 69 bpm.  LBBB.  04/27/17: Sinus rhythm.  Rate 68 bpm.  Left bundle branch block.  Echo 12/18/14: LVEF 25-95%  Grade 3 diastolic dysfunction.  Mod-severely dilated LA.  RA  mildly dilated.  Mild aortic valve sclerosis without stenosis.  Moderate mitral valve thickening.  Mild TR.    Recent Labs: 10/14/2016: HGB 12.4 04/14/2017: ALT 14; BUN 24; Creatinine, Ser 1.21; Platelet Count 130; Potassium 5.0; Sodium 140   05/20/16:  Sodium 141, potassium 4.6, BUN 21, creatinine 1.05 Hemoglobin A1c 6.3% AST 13, ALT 9 Total cholesterol 208, triglycerides 93, HDL 61, LDL 129  Lipid Panel    Component Value Date/Time   CHOL  09/13/2009 0620    157        ATP III CLASSIFICATION:  <200     mg/dL   Desirable  200-239  mg/dL   Borderline High  >=240    mg/dL   High          TRIG 112 09/13/2009 0620   HDL 41 09/13/2009 0620   CHOLHDL  3.8 09/13/2009 0620   VLDL 22 09/13/2009 0620   LDLCALC  09/13/2009 0620    94        Total Cholesterol/HDL:CHD Risk Coronary Heart Disease Risk Table                     Men   Women  1/2 Average Risk   3.4   3.3  Average Risk       5.0   4.4  2 X Average Risk   9.6   7.1  3 X Average Risk  23.4   11.0        Use the calculated Patient Ratio above and the CHD Risk Table to determine the patient's CHD Risk.        ATP III CLASSIFICATION (LDL):  <100     mg/dL   Optimal  100-129  mg/dL   Near or Above                    Optimal  130-159  mg/dL   Borderline  160-189  mg/dL   High  >190     mg/dL   Very High      Wt Readings from Last 3 Encounters:  04/27/17 95 lb 6.4 oz (43.3 kg)  04/14/17 93 lb 6.4 oz (42.4 kg)  10/14/16 90 lb (40.8 kg)      ASSESSMENT AND PLAN:  # Chronic systolic heart failure: Ms. Alcivar continues to do well and is euvolemic.  Continue carvedilol, irbesartan, and furosemide.  Labs were checked 03/2016 and her electrolytes and renal function were stable.1  # Hypertension: BP well-controlled.  Continue carvedilol and irbesartan.   # CAD s/p NSTEMI: Ms. [place] had a mild NSTEMI 11/2014 that has been medically managed.  She has not experienced any chest pain and her troponin elevation was most  consistent with demand ischemia in the setting of a BNP of 33,000.  Continue aspirin and carvedilol.  She hasn't tolerated statins in the past.  LDL was 129 05/2016. She will have lipids checked with her PCP next week.  Continue Zetia.    Current medicines are reviewed at length with the patient today.  The patient does not have concerns regarding medicines.  The following changes have been made:  none Labs/ tests ordered today include:   Orders Placed This Encounter  Procedures  . Lipid panel  . EKG 12-Lead     Disposition:   FU with Eoin Willden C. Oval Linsey, MD, [facility] in 8 months.  Most other MD appointments are q6 mo in Jan/July.  We will resume every 6 months after this appointment.   This note was written with the assistance of speech recognition software.  Please excuse any transcriptional errors.  Signed, Syed Zukas C. Oval Linsey, MD, [facility]  04/27/2017 3:06 PM     [facility]

## 2017-04-27 NOTE — Patient Instructions (Signed)
Medication Instructions:  Your physician recommends that you continue on your current medications as directed. Please refer to the Current Medication list given to you today.  Labwork: FASTING LIPID WHEN YOU GET YOUR LABS AT YOUR PRIMARY CARE  Testing/Procedures: NONE  Follow-Up: Your physician wants you to follow-up in: [place] will receive a reminder letter in the mail two months in advance. If you don't receive a letter, please call our office to schedule the follow-up appointment.  If you need a refill on your cardiac medications before your next appointment, please call your pharmacy.

## 2017-05-19 ENCOUNTER — Encounter: Payer: Self-pay | Admitting: Sports Medicine

## 2017-05-19 ENCOUNTER — Ambulatory Visit: Payer: Medicare PPO | Admitting: Sports Medicine

## 2017-05-19 DIAGNOSIS — R7303 Prediabetes: Secondary | ICD-10-CM | POA: Diagnosis not present

## 2017-05-19 DIAGNOSIS — Z7901 Long term (current) use of anticoagulants: Secondary | ICD-10-CM | POA: Diagnosis not present

## 2017-05-19 DIAGNOSIS — M79675 Pain in left toe(s): Secondary | ICD-10-CM

## 2017-05-19 DIAGNOSIS — G629 Polyneuropathy, unspecified: Secondary | ICD-10-CM

## 2017-05-19 DIAGNOSIS — B351 Tinea unguium: Secondary | ICD-10-CM | POA: Diagnosis not present

## 2017-05-19 DIAGNOSIS — M79674 Pain in right toe(s): Secondary | ICD-10-CM | POA: Diagnosis not present

## 2017-05-19 NOTE — Progress Notes (Signed)
Patient ID: Mary Cline, female   DOB: 1922-06-23, 84 y.o.   MRN: [id number]  Subjective: Mary Cline is a 84 y.o. female patient seen today in office with complaint of painful thickened and elongated toenails; unable to trim. Patient denies any changes since last visit with medical problems. Admits a little soreness to big toenails otherwise Patient has no other pedal complaints at this time.   Patient Active Problem List   Diagnosis Date Noted  . Chronic systolic heart failure ([place]) 04/03/2015  . Essential hypertension 04/03/2015  . Hyperlipidemia 04/03/2015  . Retinal vascular occlusion, unspecified 11/10/2012  . Giant cell arteritis ([place]) 11/10/2012  . Headache(784.0) 11/08/2012  . Visual changes 11/08/2012    Current Outpatient Medications on File Prior to Visit  Medication Sig Dispense Refill  . aspirin 81 MG tablet Take 81 mg by mouth daily.    . carvedilol (COREG) 3.125 MG tablet Take 1 tablet (3.125 mg total) by mouth 2 (two) times daily with a meal. 180 tablet 3  . furosemide (LASIX) 20 MG tablet Take 1 tablet (20 mg total) by mouth as needed. For weight gain more that 2 pounds in a day or 5 pounds in a week. 90 tablet 3  . glycerin adult 2 G SUPP Place 1 suppository rectally once as needed for moderate constipation.    . irbesartan (AVAPRO) 150 MG tablet Take 1 tablet (150 mg total) daily by mouth. 90 tablet 3  . Magnesium Hydroxide (EQ MILK OF MAGNESIA PO) Take by mouth as needed (PRN for no bowel movement x 2 days).    . Multiple Vitamin (MULTIVITAMIN) tablet Take 1 tablet by mouth daily.    . NONFORMULARY OR COMPOUNDED ITEM Ketoconazole and fluticasone compounded cream.  Apply as needed to affected areas as needed as directed.    Marland Kitchen omeprazole (PRILOSEC) 40 MG capsule Take 40 mg by mouth daily.    . traMADol (ULTRAM) 50 MG tablet Take 1 tablet (50 mg total) by mouth 3 (three) times daily as needed for severe pain. 20 tablet 0   No current facility-administered  medications on file prior to visit.     Allergies  Allergen Reactions  . Fish Allergy Anaphylaxis  . Iodine Anaphylaxis  . Acth [Corticotropin] Other (See Comments)    Confusion   . Actonel [Risedronate Sodium] Nausea And Vomiting  . Atorvastatin Other (See Comments)    Blisters in mouth.    . Ciprofloxacin Nausea And Vomiting  . Codeine Other (See Comments)    Headache   . Demerol [Meperidine] Other (See Comments)    Hypotension   . Garamycin [Gentamicin Sulfate] Rash  . Lactose Intolerance (Gi) Other (See Comments)    Intolerance.    . Lovastatin Other (See Comments)    blisters  . Macrobid [Nitrofurantoin Monohyd Macro] Nausea And Vomiting  . Other Hives and Swelling    Melons and strawberries.    . Paxil [Paroxetine Hcl] Nausea Only  . Penicillins Other (See Comments)    Pain.    . Sulfa Antibiotics Swelling  . Tetracyclines & Related Other (See Comments)    Stomach pain  . Vioxx [Rofecoxib] Other (See Comments)    Not known  . Vitamin D Analogs Other (See Comments)    Dizzy confused    Objective: Physical Exam  General: Well developed, nourished, no acute distress, awake, alert and oriented x 3  Vascular: Dorsalis pedis artery 1/4 bilateral, Posterior tibial artery 1/4 bilateral, skin temperature warm to warm proximal to distal  bilateral lower extremities, mild varicosities, decreased pedal hair present bilateral.  Neurological: Gross sensation present via light touch bilateral. Subjective tingling to toes, likely positional neuropathy  Dermatological: Skin is warm, dry, and supple bilateral, Nails 1-10 are tender, long, thick, and discolored with mild subungal debris, no webspace macerations present bilateral, no open lesions present bilateral, no callus/corns/hyperkeratotic tissue present bilateral. No signs of infection bilateral.  Musculoskeletal: Bunion deformities noted bilateral and lesser hammertoe that is asymptomatic. Muscular strength within normal  limits without pain on range of motion. No pain with calf compression bilateral.  Assessment and Plan:  Problem List Items Addressed This Visit    None    Visit Diagnoses    Dermatophytosis of nail    -  Primary   Pre-diabetes       Neuropathy       Toe pain, bilateral       Long term (current) use of anticoagulants         -Examined patient.  -Discussed treatment options for painful mycotic nails. -Mechanically debrided and reduced mycotic nails with sterile nail nipper and dremel nail file without incident. -Continue with toe spacer to prevent irritation at bunion onto 2nd toe as previous -Patient to return in 2.5 months/as needed for follow up evaluation or sooner if symptoms worsen.  Mary Cline, DPM

## 2017-05-25 DIAGNOSIS — G8929 Other chronic pain: Secondary | ICD-10-CM | POA: Diagnosis not present

## 2017-05-25 DIAGNOSIS — I10 Essential (primary) hypertension: Secondary | ICD-10-CM | POA: Diagnosis not present

## 2017-05-25 DIAGNOSIS — M810 Age-related osteoporosis without current pathological fracture: Secondary | ICD-10-CM | POA: Diagnosis not present

## 2017-05-25 DIAGNOSIS — Z0000 Encounter for general adult medical examination without abnormal findings: Secondary | ICD-10-CM | POA: Diagnosis not present

## 2017-05-25 DIAGNOSIS — I509 Heart failure, unspecified: Secondary | ICD-10-CM | POA: Diagnosis not present

## 2017-05-25 DIAGNOSIS — E1140 Type 2 diabetes mellitus with diabetic neuropathy, unspecified: Secondary | ICD-10-CM | POA: Diagnosis not present

## 2017-05-25 DIAGNOSIS — C9110 Chronic lymphocytic leukemia of B-cell type not having achieved remission: Secondary | ICD-10-CM | POA: Diagnosis not present

## 2017-06-23 DIAGNOSIS — K219 Gastro-esophageal reflux disease without esophagitis: Secondary | ICD-10-CM | POA: Diagnosis not present

## 2017-06-23 DIAGNOSIS — I509 Heart failure, unspecified: Secondary | ICD-10-CM | POA: Diagnosis not present

## 2017-06-23 DIAGNOSIS — I2510 Atherosclerotic heart disease of native coronary artery without angina pectoris: Secondary | ICD-10-CM | POA: Diagnosis not present

## 2017-06-23 DIAGNOSIS — I252 Old myocardial infarction: Secondary | ICD-10-CM | POA: Diagnosis not present

## 2017-06-23 DIAGNOSIS — H9193 Unspecified hearing loss, bilateral: Secondary | ICD-10-CM | POA: Diagnosis not present

## 2017-06-23 DIAGNOSIS — H3530 Unspecified macular degeneration: Secondary | ICD-10-CM | POA: Diagnosis not present

## 2017-06-23 DIAGNOSIS — M19019 Primary osteoarthritis, unspecified shoulder: Secondary | ICD-10-CM | POA: Diagnosis not present

## 2017-06-23 DIAGNOSIS — E785 Hyperlipidemia, unspecified: Secondary | ICD-10-CM | POA: Diagnosis not present

## 2017-06-23 DIAGNOSIS — I110 Hypertensive heart disease with heart failure: Secondary | ICD-10-CM | POA: Diagnosis not present

## 2017-07-28 ENCOUNTER — Ambulatory Visit: Payer: Medicare PPO | Admitting: Sports Medicine

## 2017-08-03 ENCOUNTER — Telehealth: Payer: Self-pay | Admitting: Cardiovascular Disease

## 2017-08-03 NOTE — Telephone Encounter (Signed)
Patient's daughter would like to know if she needs lab work prior to her appointment in September. She stated that the patient's PCP would not do the lab work for another office. Message has been routed to the provider's nurse.

## 2017-08-03 NOTE — Telephone Encounter (Signed)
New Message:    Pt  Is scheduled to see Dr Oval Linsey on 12-22-17.She wants to know if pt needs lab work before her appointment or the same day of her appointment?

## 2017-08-04 NOTE — Telephone Encounter (Signed)
Left message to call back  

## 2017-08-04 NOTE — Telephone Encounter (Signed)
Spoke with daughter and she will have mother get labs 08/11/17 when she has another appointment in town.

## 2017-08-05 ENCOUNTER — Other Ambulatory Visit: Payer: Self-pay | Admitting: Cardiovascular Disease

## 2017-08-11 ENCOUNTER — Ambulatory Visit: Payer: Medicare PPO | Admitting: Sports Medicine

## 2017-08-11 ENCOUNTER — Encounter: Payer: Self-pay | Admitting: Sports Medicine

## 2017-08-11 DIAGNOSIS — M79674 Pain in right toe(s): Secondary | ICD-10-CM | POA: Diagnosis not present

## 2017-08-11 DIAGNOSIS — M79675 Pain in left toe(s): Secondary | ICD-10-CM | POA: Diagnosis not present

## 2017-08-11 DIAGNOSIS — G629 Polyneuropathy, unspecified: Secondary | ICD-10-CM

## 2017-08-11 DIAGNOSIS — Z79899 Other long term (current) drug therapy: Secondary | ICD-10-CM | POA: Diagnosis not present

## 2017-08-11 DIAGNOSIS — B351 Tinea unguium: Secondary | ICD-10-CM

## 2017-08-11 DIAGNOSIS — R7303 Prediabetes: Secondary | ICD-10-CM

## 2017-08-11 DIAGNOSIS — E7800 Pure hypercholesterolemia, unspecified: Secondary | ICD-10-CM | POA: Diagnosis not present

## 2017-08-11 NOTE — Progress Notes (Signed)
Patient ID: Mary Cline, female   DOB: Aug 06, 1922, 84 y.o.   MRN: [id number]  Subjective: Mary Cline is a 84 y.o. female patient seen today in office with complaint of painful thickened and elongated toenails; unable to trim. Patient denies any changes since last visit with medical problems.  Patient Active Problem List   Diagnosis Date Noted  . Chronic systolic heart failure ([place]) 04/03/2015  . Essential hypertension 04/03/2015  . Hyperlipidemia 04/03/2015  . Retinal vascular occlusion, unspecified 11/10/2012  . Giant cell arteritis ([place]) 11/10/2012  . Headache(784.0) 11/08/2012  . Visual changes 11/08/2012    Current Outpatient Medications on File Prior to Visit  Medication Sig Dispense Refill  . aspirin 81 MG tablet Take 81 mg by mouth daily.    . carvedilol (COREG) 3.125 MG tablet Take 1 tablet (3.125 mg total) by mouth 2 (two) times daily with a meal. 180 tablet 3  . clopidogrel (PLAVIX) 75 MG tablet TAKE 1 TABLET (75 MG TOTAL) BY MOUTH DAILY. 90 tablet 2  . furosemide (LASIX) 20 MG tablet Take 1 tablet (20 mg total) by mouth as needed. For weight gain more that 2 pounds in a day or 5 pounds in a week. 90 tablet 3  . glycerin adult 2 G SUPP Place 1 suppository rectally once as needed for moderate constipation.    . irbesartan (AVAPRO) 150 MG tablet Take 1 tablet (150 mg total) daily by mouth. 90 tablet 3  . Magnesium Hydroxide (EQ MILK OF MAGNESIA PO) Take by mouth as needed (PRN for no bowel movement x 2 days).    . Multiple Vitamin (MULTIVITAMIN) tablet Take 1 tablet by mouth daily.    . NONFORMULARY OR COMPOUNDED ITEM Ketoconazole and fluticasone compounded cream.  Apply as needed to affected areas as needed as directed.    Marland Kitchen omeprazole (PRILOSEC) 40 MG capsule Take 40 mg by mouth daily.    . traMADol (ULTRAM) 50 MG tablet Take 1 tablet (50 mg total) by mouth 3 (three) times daily as needed for severe pain. 20 tablet 0   No current facility-administered  medications on file prior to visit.     Allergies  Allergen Reactions  . Fish Allergy Anaphylaxis  . Iodine Anaphylaxis  . Acth [Corticotropin] Other (See Comments)    Confusion   . Actonel [Risedronate Sodium] Nausea And Vomiting  . Atorvastatin Other (See Comments)    Blisters in mouth.    . Ciprofloxacin Nausea And Vomiting  . Codeine Other (See Comments)    Headache   . Demerol [Meperidine] Other (See Comments)    Hypotension   . Garamycin [Gentamicin Sulfate] Rash  . Lactose Intolerance (Gi) Other (See Comments)    Intolerance.    . Lovastatin Other (See Comments)    blisters  . Macrobid [Nitrofurantoin Monohyd Macro] Nausea And Vomiting  . Other Hives and Swelling    Melons and strawberries.    . Paxil [Paroxetine Hcl] Nausea Only  . Penicillins Other (See Comments)    Pain.    . Sulfa Antibiotics Swelling  . Tetracyclines & Related Other (See Comments)    Stomach pain  . Vioxx [Rofecoxib] Other (See Comments)    Not known  . Vitamin D Analogs Other (See Comments)    Dizzy confused    Objective: Physical Exam  General: Well developed, nourished, no acute distress, awake, alert and oriented x 3  Vascular: Dorsalis pedis artery 1/4 bilateral, Posterior tibial artery 1/4 bilateral, skin temperature warm to warm proximal to  distal bilateral lower extremities, mild varicosities, decreased pedal hair present bilateral.  Neurological: Gross sensation present via light touch bilateral. Subjective tingling to toes, likely positional neuropathy  Dermatological: Skin is warm, dry, and supple bilateral, Nails 1-10 are tender, long, thick, and discolored with mild subungal debris, no webspace macerations present bilateral, no open lesions present bilateral, no callus/corns/hyperkeratotic tissue present bilateral. No signs of infection bilateral.  Musculoskeletal: Bunion deformities noted bilateral and lesser hammertoe that is asymptomatic. Muscular strength within normal  limits without pain on range of motion. No pain with calf compression bilateral.  Assessment and Plan:  Problem List Items Addressed This Visit    None    Visit Diagnoses    Dermatophytosis of nail    -  Primary   Pre-diabetes       Neuropathy       Toe pain, bilateral         -Examined patient.  -Discussed treatment options for painful mycotic nails. -Mechanically debrided and reduced mycotic nails with sterile nail nipper and dremel nail file without incident. -Patient to return in 2.5 to 3 months for follow up evaluation or sooner if symptoms worsen.  Mary Cline, DPM

## 2017-08-12 LAB — LIPID PANEL
CHOLESTEROL TOTAL: 138 mg/dL (ref 100–199)
Chol/HDL Ratio: 2.6 ratio (ref 0.0–4.4)
HDL: 53 mg/dL (ref >39)
LDL Calculated: 71 mg/dL (ref 0–99)
Triglycerides: 71 mg/dL (ref 0–149)
VLDL CHOLESTEROL CAL: 14 mg/dL (ref 5–40)

## 2017-08-12 LAB — COMPREHENSIVE METABOLIC PANEL
ALK PHOS: 63 IU/L (ref 39–117)
ALT: 13 IU/L (ref 0–32)
AST: 19 IU/L (ref 0–40)
Albumin/Globulin Ratio: 1.7 (ref 1.2–2.2)
Albumin: 3.8 g/dL (ref 3.2–4.6)
BUN/Creatinine Ratio: 36 — ABNORMAL HIGH (ref 12–28)
BUN: 27 mg/dL (ref 10–36)
Bilirubin Total: 0.4 mg/dL (ref 0.0–1.2)
CO2: 25 mmol/L (ref 20–29)
CREATININE: 0.74 mg/dL (ref 0.57–1.00)
Calcium: 8.9 mg/dL (ref 8.7–10.3)
Chloride: 104 mmol/L (ref 96–106)
GFR calc Af Amer: 81 mL/min/{1.73_m2} (ref >59)
GFR calc non Af Amer: 71 mL/min/{1.73_m2} (ref >59)
GLUCOSE: 122 mg/dL — AB (ref 65–99)
Globulin, Total: 2.2 g/dL (ref 1.5–4.5)
Potassium: 5.2 mmol/L (ref 3.5–5.2)
Sodium: 142 mmol/L (ref 134–144)
TOTAL PROTEIN: 6 g/dL (ref 6.0–8.5)

## 2017-08-13 ENCOUNTER — Telehealth: Payer: Self-pay | Admitting: Cardiovascular Disease

## 2017-08-13 NOTE — Telephone Encounter (Signed)
Spoke with Pt's daughter and informed of Lab results. Verbalized understanding with no further questions.

## 2017-08-13 NOTE — Telephone Encounter (Signed)
New Message:      Pt's daughter is calling back for results

## 2017-08-25 ENCOUNTER — Other Ambulatory Visit: Payer: Self-pay | Admitting: Cardiovascular Disease

## 2017-08-25 NOTE — Telephone Encounter (Signed)
Rx sent to pharmacy   

## 2017-09-10 ENCOUNTER — Telehealth: Payer: Self-pay | Admitting: Cardiovascular Disease

## 2017-09-10 NOTE — Telephone Encounter (Signed)
Left a message to call back.

## 2017-09-10 NOTE — Telephone Encounter (Signed)
New Message:       Pt c/o medication issue:  1. Name of Medication: Ezephinibe  2. How are you currently taking this medication (dosage and times per day)?   3. Are you having a reaction (difficulty breathing--STAT)? No  4. What is your medication issue? Pt is needing a refill for this medication but it is not listed in her current medications. Pt is calling her PCP to see if they are the ones that prescribed it

## 2017-10-06 DIAGNOSIS — C9110 Chronic lymphocytic leukemia of B-cell type not having achieved remission: Secondary | ICD-10-CM | POA: Diagnosis not present

## 2017-10-06 DIAGNOSIS — R7303 Prediabetes: Secondary | ICD-10-CM | POA: Diagnosis not present

## 2017-10-06 DIAGNOSIS — I509 Heart failure, unspecified: Secondary | ICD-10-CM | POA: Diagnosis not present

## 2017-10-06 DIAGNOSIS — G8929 Other chronic pain: Secondary | ICD-10-CM | POA: Diagnosis not present

## 2017-10-06 DIAGNOSIS — I10 Essential (primary) hypertension: Secondary | ICD-10-CM | POA: Diagnosis not present

## 2017-10-06 DIAGNOSIS — M810 Age-related osteoporosis without current pathological fracture: Secondary | ICD-10-CM | POA: Diagnosis not present

## 2017-10-26 ENCOUNTER — Telehealth: Payer: Self-pay | Admitting: *Deleted

## 2017-10-26 NOTE — Telephone Encounter (Signed)
Received PA request on Entresto which is no longer on her med list. Left message to call back

## 2017-10-27 DIAGNOSIS — M79601 Pain in right arm: Secondary | ICD-10-CM | POA: Diagnosis not present

## 2017-11-03 NOTE — Telephone Encounter (Signed)
Follow Up:    Reference#[id number]   Calling to check on the status of pt's prior authorization for Entresto.

## 2017-11-03 NOTE — Telephone Encounter (Signed)
Left another message for daughter to call back. Question taking Entresto since not on medication list, Irbesartan is.

## 2017-11-04 NOTE — Telephone Encounter (Signed)
Spoke with patients daughter and patient is NOT taking Entresto. Advised CoverMy no longer taking

## 2017-11-09 DIAGNOSIS — H35359 Cystoid macular degeneration, unspecified eye: Secondary | ICD-10-CM | POA: Diagnosis not present

## 2017-11-17 ENCOUNTER — Ambulatory Visit: Payer: Medicare PPO | Admitting: Sports Medicine

## 2017-11-17 ENCOUNTER — Encounter: Payer: Self-pay | Admitting: Sports Medicine

## 2017-11-17 DIAGNOSIS — G629 Polyneuropathy, unspecified: Secondary | ICD-10-CM

## 2017-11-17 DIAGNOSIS — B351 Tinea unguium: Secondary | ICD-10-CM | POA: Diagnosis not present

## 2017-11-17 DIAGNOSIS — R7303 Prediabetes: Secondary | ICD-10-CM | POA: Diagnosis not present

## 2017-11-17 DIAGNOSIS — M79674 Pain in right toe(s): Secondary | ICD-10-CM

## 2017-11-17 DIAGNOSIS — M79675 Pain in left toe(s): Secondary | ICD-10-CM

## 2017-11-17 NOTE — Progress Notes (Signed)
Patient ID: Mary Cline, female   DOB: 02-Feb-1923, 84 y.o.   MRN: [id number]  Subjective: Mary Cline is a 84 y.o. female patient seen today in office with complaint of painful thickened and elongated toenails; unable to trim. Patient denies any changes since last visit with medical problems. No other issues noted.  Patient Active Problem List   Diagnosis Date Noted  . Chronic systolic heart failure ([place]) 04/03/2015  . Essential hypertension 04/03/2015  . Hyperlipidemia 04/03/2015  . Retinal vascular occlusion, unspecified 11/10/2012  . Giant cell arteritis ([place]) 11/10/2012  . Headache(784.0) 11/08/2012  . Visual changes 11/08/2012    Current Outpatient Medications on File Prior to Visit  Medication Sig Dispense Refill  . aspirin 81 MG tablet Take 81 mg by mouth daily.    . carvedilol (COREG) 3.125 MG tablet TAKE 1 TABLET TWICE DAILY WITH MEALS 180 tablet 1  . clopidogrel (PLAVIX) 75 MG tablet TAKE 1 TABLET (75 MG TOTAL) BY MOUTH DAILY. (Patient not taking: Reported on 11/17/2017) 90 tablet 2  . furosemide (LASIX) 20 MG tablet Take 1 tablet (20 mg total) by mouth as needed. For weight gain more that 2 pounds in a day or 5 pounds in a week. 90 tablet 3  . glycerin adult 2 G SUPP Place 1 suppository rectally once as needed for moderate constipation.    . irbesartan (AVAPRO) 150 MG tablet Take 1 tablet (150 mg total) daily by mouth. 90 tablet 3  . Magnesium Hydroxide (EQ MILK OF MAGNESIA PO) Take by mouth as needed (PRN for no bowel movement x 2 days).    . Multiple Vitamin (MULTIVITAMIN) tablet Take 1 tablet by mouth daily.    . NONFORMULARY OR COMPOUNDED ITEM Ketoconazole and fluticasone compounded cream.  Apply as needed to affected areas as needed as directed.    Marland Kitchen omeprazole (PRILOSEC) 40 MG capsule Take 40 mg by mouth daily.    . traMADol (ULTRAM) 50 MG tablet Take 1 tablet (50 mg total) by mouth 3 (three) times daily as needed for severe pain. 20 tablet 0   No current  facility-administered medications on file prior to visit.     Allergies  Allergen Reactions  . Fish Allergy Anaphylaxis  . Iodine Anaphylaxis  . Actonel [Risedronate Sodium] Nausea And Vomiting  . Atorvastatin Other (See Comments)    Blisters in mouth.   Blisters in mouth.    . Ciprofloxacin Nausea And Vomiting  . Codeine Other (See Comments)    Headache  Headache  . Corticotropin Other (See Comments)    Confusion  Confusion  . Demeclocycline     Stomach pain  . Dihydrotachysterol     Dizzy confused  . Garamycin [Gentamicin Sulfate] Rash  . Lactose Intolerance (Gi) Other (See Comments)    Intolerance.    . Lactulose     Intolerance.    . Lovastatin Other (See Comments)    blisters blisters  . Macrobid [Nitrofurantoin Monohyd Macro] Nausea And Vomiting  . Meperidine Other (See Comments)    Hypotension  Hypotension  . Other Hives and Swelling    Melons and strawberries.    . Paxil [Paroxetine Hcl] Nausea Only  . Penicillins Other (See Comments)    Pain.   Pain.    . Rofecoxib Other (See Comments)    Not known Not known  . Sulfa Antibiotics Swelling  . Tetracyclines & Related Other (See Comments)    Stomach pain  . Vitamin D Analogs Other (See Comments)  Dizzy confused    Objective: Physical Exam  General: Well developed, nourished, no acute distress, awake, alert and oriented x 3  Vascular: Dorsalis pedis artery 1/4 bilateral, Posterior tibial artery 1/4 bilateral, skin temperature warm to warm proximal to distal bilateral lower extremities, mild varicosities, decreased pedal hair present bilateral.  Neurological: Gross sensation present via light touch bilateral. Subjective tingling to toes, likely positional neuropathy  Dermatological: Skin is warm, dry, and supple bilateral, Nails 1-10 are tender, long, thick, and discolored with mild subungal debris, no webspace macerations present bilateral, no open lesions present bilateral, no  callus/corns/hyperkeratotic tissue present bilateral. No signs of infection bilateral.  Musculoskeletal: Bunion deformities noted bilateral and lesser hammertoe that is asymptomatic. Muscular strength within normal limits without pain on range of motion. No pain with calf compression bilateral.  Assessment and Plan:  Problem List Items Addressed This Visit    None    Visit Diagnoses    Dermatophytosis of nail    -  Primary   Pre-diabetes       Neuropathy       Toe pain, bilateral         -Examined patient.  -Discussed treatment options for painful mycotic nails. -Mechanically debrided and reduced mycotic nails with sterile nail nipper and dremel nail file without incident. -Patient to return in 2.5 to 3 months for follow up evaluation or sooner if symptoms worsen.  [name], DPM

## 2017-11-24 ENCOUNTER — Ambulatory Visit: Payer: Medicare PPO | Admitting: Sports Medicine

## 2017-12-15 ENCOUNTER — Encounter: Payer: Self-pay | Admitting: Cardiovascular Disease

## 2017-12-15 ENCOUNTER — Ambulatory Visit: Payer: Medicare PPO | Admitting: Cardiovascular Disease

## 2017-12-15 VITALS — BP 132/70 | HR 76 | Ht 62.0 in | Wt 93.8 lb

## 2017-12-15 DIAGNOSIS — I2510 Atherosclerotic heart disease of native coronary artery without angina pectoris: Secondary | ICD-10-CM | POA: Diagnosis not present

## 2017-12-15 DIAGNOSIS — I5042 Chronic combined systolic (congestive) and diastolic (congestive) heart failure: Secondary | ICD-10-CM | POA: Diagnosis not present

## 2017-12-15 DIAGNOSIS — I10 Essential (primary) hypertension: Secondary | ICD-10-CM | POA: Diagnosis not present

## 2017-12-15 NOTE — Patient Instructions (Signed)
Medication Instructions:  INCREASE YOUR CARVEDILOL TO 6.25 MG TWICE A DAY   Labwork: NONE  Testing/Procedures: NONE  Follow-Up: Your physician recommends that you schedule a follow-up appointment in: [place] IN BLOOD PRESSURE OR PA/NP  Your physician recommends that you schedule a follow-up appointment in: 3 MONTHS WITH DR [facility]   Any Other Special Instructions Will Be Listed Below (If Applicable).  MONITOR AND LOG YOUR BLOOD PRESSURE. BRING YOUR READINGS AND MACHINE TO FOLLOW UP   If you need a refill on your cardiac medications before your next appointment, please call your pharmacy.

## 2017-12-15 NOTE — Progress Notes (Signed)
Cardiology Office Note   Date:  12/15/2017   ID:  Mary Cline, DOB 06/23/1922, MRN [id number]  PCP:  [name], MD  Cardiologist:   Skeet Latch, MD   No chief complaint on file.  History of Present Illness: Mary Cline is a 84 y.o. female with hypertension, diabetes, GERD, giant cell arteritis, CLL, chronic systolic heart failure LVEF 15-20%, and LBBB here for follow up.  Mary Cline was seen by [name] (cardiolgist, [facility]) on 11/2014.  At that time she was following up after a hospitalization for systolic heart failure that was likely ischemic.  She was initially hospitalized for shortness of breath and was found to have an NSTEMI.  Troponin peaked at 0.17.  BNP was 33,000. EKG revealed LBBB and no prior EKGs were available for comparison.  Her symptoms improved with diuresis.   Her family decided not to pursue aggressive measures given her age. Mary Cline has been unable to get financial assistance to afford Entresto.  She was switched to irbesartan.    Since her last appointment Mary Cline has been feeling well.  She checks her blood pressure each day and notes that it has been elevated lately.  It is mostly running in the 140s to 160s over 50s to 90s.  For the last 3 weeks she was on vacation with her daughter in [place].  However both of her daughters cook her meals and limit salt intake.  Her weight has been steady and she has not had any lower extremity edema, orthopnea, or PND.  She denies chest pain or pressure.  Her breathing has been very good.  Her only complaints are aches and pains when the rain is coming and the fact that her feet feel numb and cold.  She has constipation but this is managed by taking a laxative every other day.   Past Medical History:  Diagnosis Date  . Anemia   . Arthritis   . Chronic systolic heart failure ([place]) 04/03/2015  . Diabetes mellitus   . Essential hypertension 04/03/2015  . GERD  (gastroesophageal reflux disease)   . Hernia   . Hyperlipidemia 04/03/2015  . Hypertension   . Macular degeneration disease     Past Surgical History:  Procedure Laterality Date  . ANGIOPLASTY    . APPENDECTOMY    . HYSTEROTOMY       Current Outpatient Medications  Medication Sig Dispense Refill  . aspirin 81 MG tablet Take 81 mg by mouth daily.    . carvedilol (COREG) 3.125 MG tablet Take 3.125 mg by mouth as directed. TAKE 2 TABLETS BY MOUTH TWICE A DAY    . Cyanocobalamin (VITAMIN B 12 PO) Take by mouth every other day.    . ezetimibe (ZETIA) 10 MG tablet Take 10 mg by mouth daily.    . furosemide (LASIX) 20 MG tablet Take 1 tablet (20 mg total) by mouth as needed. For weight gain more that 2 pounds in a day or 5 pounds in a week. 90 tablet 3  . glycerin adult 2 G SUPP Place 1 suppository rectally once as needed for moderate constipation.    . ibandronate (BONIVA) 150 MG tablet Take 150 mg by mouth every 30 (thirty) days. Take in the morning with a full glass of water, on an empty stomach, and do not take anything else by mouth or lie down for the next 30 min.    . irbesartan (AVAPRO) 150 MG tablet Take 1 tablet (  150 mg total) daily by mouth. 90 tablet 3  . Magnesium Hydroxide (EQ MILK OF MAGNESIA PO) Take by mouth as needed (PRN for no bowel movement x 2 days).    . Multiple Vitamin (MULTIVITAMIN) tablet Take 1 tablet by mouth daily.    . NONFORMULARY OR COMPOUNDED ITEM Ketoconazole and fluticasone compounded cream.  Apply as needed to affected areas as needed as directed.    Marland Kitchen omeprazole (PRILOSEC) 40 MG capsule Take 40 mg by mouth daily.    . traMADol (ULTRAM) 50 MG tablet Take 1 tablet (50 mg total) by mouth 3 (three) times daily as needed for severe pain. 20 tablet 0   No current facility-administered medications for this visit.     Allergies:   Fish allergy; Iodine; Actonel [risedronate sodium]; Atorvastatin; Ciprofloxacin; Codeine; Corticotropin; Demeclocycline;  Dihydrotachysterol; Garamycin [gentamicin sulfate]; Lactose intolerance (gi); Lactulose; Lovastatin; Macrobid [nitrofurantoin monohyd macro]; Meperidine; Other; Paxil [paroxetine hcl]; Penicillins; Rofecoxib; Sulfa antibiotics; Tetracyclines & related; and Vitamin d analogs    Social History:  The patient  reports that she has never smoked. She has never used smokeless tobacco. She reports that she does not drink alcohol or use drugs.   Family History:  The patient's family history includes Cancer in her sister; Diabetes in her brother, brother, brother, and maternal grandmother; Heart Problems in her daughter, daughter, maternal grandfather, paternal grandfather, and paternal grandmother.    ROS:  Please see the history of present illness.   Otherwise, review of systems are positive for pain in feet and memory loss.   All other systems are reviewed and negative.    PHYSICAL EXAM: VS:  BP 132/70   Pulse 76   Ht 5\' 2"  (3.382 m)   Wt 93 lb 12.8 oz (42.5 kg)   BMI 17.16 kg/m  , BMI Body mass index is 17.16 kg/m. GENERAL:  Well appearing HEENT: Pupils equal round and reactive, fundi not visualized, oral mucosa unremarkable NECK:  No jugular venous distention, waveform within normal limits, carotid upstroke brisk and symmetric, no bruits, no thyromegaly LYMPHATICS:  No cervical adenopathy LUNGS:  Clear to auscultation bilaterally HEART:  RRR.  PMI not displaced or sustained,S1 and S2 within normal limits, no S3, no S4, no clicks, no rubs, II/VI systolic murmur at the LUSB ABD:  Flat, positive bowel sounds normal in frequency in pitch, no bruits, no rebound, no guarding, no midline pulsatile mass, no hepatomegaly, no splenomegaly EXT:  2 plus pulses throughout, no edema, no cyanosis no clubbing SKIN:  No rashes no nodules NEURO:  Cranial nerves II through XII grossly intact, motor grossly intact throughout PSYCH:  Cognitively intact, oriented to person place and time   EKG:  EKG is ordered  today. 11/23/15: Sinus rhythm.  Rate 76 bpm.  LBBB. The ekg ordered 04/03/15 demonstrates sinus rhythm.  LBBB.  10/07/16: Sinus rhythm rate 69 bpm.  LBBB.  04/27/17: Sinus rhythm.  Rate 68 bpm.  Left bundle branch block.  12/15/17: Sinus rhythm.  Rate 76 bpm.  LBBB  Echo 12/18/14: LVEF 50-53%  Grade 3 diastolic dysfunction.  Mod-severely dilated LA.  RA mildly dilated.  Mild aortic valve sclerosis without stenosis.  Moderate mitral valve thickening.  Mild TR.    Recent Labs: 04/14/2017: Hemoglobin 11.7; Platelet Count 130 08/11/2017: ALT 13; BUN 27; Creatinine, Ser 0.74; Potassium 5.2; Sodium 142   05/20/16:  Sodium 141, potassium 4.6, BUN 21, creatinine 1.05 Hemoglobin A1c 6.3% AST 13, ALT 9 Total cholesterol 208, triglycerides 93, HDL 61, LDL 129  Lipid Panel    Component Value Date/Time   CHOL 138 08/11/2017 1028   TRIG 71 08/11/2017 1028   HDL 53 08/11/2017 1028   CHOLHDL 2.6 08/11/2017 1028   CHOLHDL 3.8 09/13/2009 0620   VLDL 22 09/13/2009 0620   LDLCALC 71 08/11/2017 1028      Wt Readings from Last 3 Encounters:  12/15/17 93 lb 12.8 oz (42.5 kg)  04/27/17 95 lb 6.4 oz (43.3 kg)  04/14/17 93 lb 6.4 oz (42.4 kg)      ASSESSMENT AND PLAN:  # Chronic systolic heart failure: # Hypertension:  Mary Cline continues to do well and is euvolemic.  Continue carvedilol, irbesartan, and furosemide.  Increase carvedilol to 6.25mg  bid given elevated BP.  Her blood pressure readings at home are consistently elevated but have been well-controlled in clinic.  Therefore we do not want to make a large change.  She will bring her blood pressure cuff to her follow-up appointment.  # CAD s/p NSTEMI: Mary Cline had a mild NSTEMI 11/2014 that has been medically managed.  She has not experienced any chest pain and her troponin elevation was most consistent with demand ischemia in the setting of a BNP of 33,000.  Continue aspirin and increase carvedilol as above.  She hasn't tolerated statins in  the past.  LDL improved to 71 on 07/2017.  Continue Zetia.  Current medicines are reviewed at length with the patient today.  The patient does not have concerns regarding medicines.  The following changes have been made: Increase carvedilol Labs/ tests ordered today include:   No orders of the defined types were placed in this encounter.    Disposition:   FU with Suzy Kugel C. Oval Linsey, MD, [facility] in 3 months.  APP or pharmacist in 1 month.   Signed, Jimeka Balan C. Oval Linsey, MD, [facility]  12/15/2017 11:31 AM    [place]

## 2018-01-11 ENCOUNTER — Other Ambulatory Visit: Payer: Self-pay | Admitting: Pharmacist Clinician (PhC)/ Clinical Pharmacy Specialist

## 2018-01-11 ENCOUNTER — Ambulatory Visit (INDEPENDENT_AMBULATORY_CARE_PROVIDER_SITE_OTHER): Payer: Medicare PPO | Admitting: Pharmacist Clinician (PhC)/ Clinical Pharmacy Specialist

## 2018-01-11 ENCOUNTER — Encounter: Payer: Self-pay | Admitting: Pharmacist Clinician (PhC)/ Clinical Pharmacy Specialist

## 2018-01-11 DIAGNOSIS — I10 Essential (primary) hypertension: Secondary | ICD-10-CM

## 2018-01-11 MED ORDER — CARVEDILOL 6.25 MG PO TABS: 6.2500 mg | ORAL_TABLET | Freq: Two times a day (BID) | ORAL | 3 refills | Status: DC

## 2018-01-11 NOTE — Progress Notes (Signed)
01/11/2018 MAIKO SALAIS 31-Jul-1922 [id number]   HPI:  Mary Cline is a 84 y.o. female patient of Dr Oval Linsey, with a [place] below who presents today for hypertension clinic evaluation.  In addition to hypertension, her medical history is significant for CAD s/p  NSTEMI, DM (diet controlled), giant cell arteritis, CLL, chronic systolic HF (EF 27/51%) and LBBB.    At her last visit with Dr. Oval Linsey, her in office BP was good at 132/70, however her home readings had been running consistently from 700-174 systolic.  Her carvedilol was increased from 3.125 to 6.25 mg bid.  She has not noticed any side effects or problems with the increased dose.  She was asked to return to [facility] after a few weeks and bring her home cuff in for comparison.    Today she reports feeling well.  She has some GI issues that prevent her from eating larger meals, so instead she tends to graze about every 3 hours.  She is upset that she seems to have recently developed a rash when eating chocolate, as she doesn't like the other flavors of meal supplements.    Blood Pressure Goal:  130/80  Current Medications:  Carvedilol 6.25 mg bid  Irbesartan 150 mg qd  Social Hx:  No tobacco; no alcohol; does drink decaf coffee, recently gave up chocolate due to allergy  Diet:  Grazes - eats about every 3 hours; no salt; stomach herniated into chest - no roughage, caffeine, alcohol or larger meals.   Home BP readings:  Home cuff about 6 months old - wrist monitor.  With arm at rest on chair arm read 160/99, moved it to rest on her chest, 160/99.  Most home readings in the past 2-3 weeks are between 150-170, with a few in the 130' and 140's.  Unfortunately, this cuff reads 28/15 points higher than the office reading.   Intolerances:   Multiple drug and food sensitivities  Labs:  10/27/17:  Na 138, K 5.2, Glu 108, BUN 27, SCr 0.85, GFR 62 Wt Readings from Last 3 Encounters:  12/15/17 93 lb 12.8 oz (42.5 kg)    04/27/17 95 lb 6.4 oz (43.3 kg)  04/14/17 93 lb 6.4 oz (42.4 kg)   BP Readings from Last 3 Encounters:  01/11/18 132/84  12/15/17 132/70  04/27/17 130/68   Pulse Readings from Last 3 Encounters:  01/11/18 68  12/15/17 76  04/27/17 68    Current Outpatient Medications  Medication Sig Dispense Refill  . aspirin 81 MG tablet Take 81 mg by mouth daily.    . carvedilol (COREG) 3.125 MG tablet Take 3.125 mg by mouth as directed. TAKE 2 TABLETS BY MOUTH TWICE A DAY    . Cyanocobalamin (VITAMIN B 12 PO) Take by mouth every other day.    . ezetimibe (ZETIA) 10 MG tablet Take 10 mg by mouth daily.    . furosemide (LASIX) 20 MG tablet Take 1 tablet (20 mg total) by mouth as needed. For weight gain more that 2 pounds in a day or 5 pounds in a week. 90 tablet 3  . glycerin adult 2 G SUPP Place 1 suppository rectally once as needed for moderate constipation.    . ibandronate (BONIVA) 150 MG tablet Take 150 mg by mouth every 30 (thirty) days. Take in the morning with a full glass of water, on an empty stomach, and do not take anything else by mouth or lie down for the next 30 min.    Marland Kitchen  irbesartan (AVAPRO) 150 MG tablet Take 1 tablet (150 mg total) daily by mouth. 90 tablet 3  . Magnesium Hydroxide (EQ MILK OF MAGNESIA PO) Take by mouth as needed (PRN for no bowel movement x 2 days).    . Multiple Vitamin (MULTIVITAMIN) tablet Take 1 tablet by mouth daily.    . NONFORMULARY OR COMPOUNDED ITEM Ketoconazole and fluticasone compounded cream.  Apply as needed to affected areas as needed as directed.    Marland Kitchen omeprazole (PRILOSEC) 40 MG capsule Take 40 mg by mouth daily.    . traMADol (ULTRAM) 50 MG tablet Take 1 tablet (50 mg total) by mouth 3 (three) times daily as needed for severe pain. 20 tablet 0   No current facility-administered medications for this visit.     Allergies  Allergen Reactions  . Fish Allergy Anaphylaxis  . Iodine Anaphylaxis  . Actonel [Risedronate Sodium] Nausea And Vomiting  .  Atorvastatin Other (See Comments)    Blisters in mouth.   Blisters in mouth.    . Ciprofloxacin Nausea And Vomiting  . Codeine Other (See Comments)    Headache  Headache  . Corticotropin Other (See Comments)    Confusion  Confusion  . Demeclocycline     Stomach pain  . Dihydrotachysterol     Dizzy confused  . Garamycin [Gentamicin Sulfate] Rash  . Lactose Intolerance (Gi) Other (See Comments)    Intolerance.    . Lactulose     Intolerance.    . Lovastatin Other (See Comments)    blisters blisters  . Macrobid [Nitrofurantoin Monohyd Macro] Nausea And Vomiting  . Meperidine Other (See Comments)    Hypotension  Hypotension  . Other Hives and Swelling    Melons and strawberries.    . Paxil [Paroxetine Hcl] Nausea Only  . Penicillins Other (See Comments)    Pain.   Pain.    . Rofecoxib Other (See Comments)    Not known Not known  . Sulfa Antibiotics Swelling  . Tetracyclines & Related Other (See Comments)    Stomach pain  . Vitamin D Analogs Other (See Comments)    Dizzy confused    Past Medical History:  Diagnosis Date  . Anemia   . Arthritis   . Chronic systolic heart failure ([place]) 04/03/2015  . Diabetes mellitus   . Essential hypertension 04/03/2015  . GERD (gastroesophageal reflux disease)   . Hernia   . Hyperlipidemia 04/03/2015  . Hypertension   . Macular degeneration disease     Blood pressure 132/84, pulse 68.  Essential hypertension Patient with essential hypertension, home cuff unfortunately not accurate.  Office reading today was 132/84 and will have patient continue with current medications.  Daughter asked about the need to purchase a new cuff, but I suggested they not do so.  Wrist cuffs tend to have a higher degree of inaccuracy, and with patient have such small arms, she would need a child cuff, as the adult normal is too large.  Suggested she just keep track of what other MD offices report and occasionally check it at [facility] or [facility].  She will  follow up with Dr. Oval Linsey in December.    Tommy Medal PharmD CPP [facility] [address] [place], [place] [address] [phone]

## 2018-01-11 NOTE — Patient Instructions (Signed)
Return for a a follow up appointment with Dr. Oval Linsey in December   Your blood pressure today is 132/84  Take your BP meds as follows:  Continue with all current medications.  Bring all of your meds, your BP cuff and your record of home blood pressures to your next appointment.  Exercise as you're able, try to walk approximately 30 minutes per day.  Keep salt intake to a minimum, especially watch canned and prepared boxed foods.  Eat more fresh fruits and vegetables and fewer canned items.  Avoid eating in fast food restaurants.    HOW TO TAKE YOUR BLOOD PRESSURE: . Rest 5 minutes before taking your blood pressure. .  Don't smoke or drink caffeinated beverages for at least 30 minutes before. . Take your blood pressure before (not after) you eat. . Sit comfortably with your back supported and both feet on the floor (don't cross your legs). . Elevate your arm to heart level on a table or a desk. . Use the proper sized cuff. It should fit smoothly and snugly around your bare upper arm. There should be enough room to slip a fingertip under the cuff. The bottom edge of the cuff should be 1 inch above the crease of the elbow. . Ideally, take 3 measurements at one sitting and record the average.

## 2018-01-11 NOTE — Assessment & Plan Note (Signed)
Patient with essential hypertension, home cuff unfortunately not accurate.  Office reading today was 132/84 and will have patient continue with current medications.  Daughter asked about the need to purchase a new cuff, but I suggested they not do so.  Wrist cuffs tend to have a higher degree of inaccuracy, and with patient have such small arms, she would need a child cuff, as the adult normal is too large.  Suggested she just keep track of what other MD offices report and occasionally check it at [facility] or [facility].  She will follow up with Dr. Oval Linsey in December.

## 2018-01-14 ENCOUNTER — Ambulatory Visit: Payer: Medicare PPO

## 2018-02-16 ENCOUNTER — Ambulatory Visit: Payer: Medicare PPO | Admitting: Sports Medicine

## 2018-02-16 ENCOUNTER — Encounter: Payer: Self-pay | Admitting: Sports Medicine

## 2018-02-16 DIAGNOSIS — Z7901 Long term (current) use of anticoagulants: Secondary | ICD-10-CM

## 2018-02-16 DIAGNOSIS — Z23 Encounter for immunization: Secondary | ICD-10-CM | POA: Diagnosis not present

## 2018-02-16 DIAGNOSIS — M79674 Pain in right toe(s): Secondary | ICD-10-CM | POA: Diagnosis not present

## 2018-02-16 DIAGNOSIS — M79675 Pain in left toe(s): Secondary | ICD-10-CM | POA: Diagnosis not present

## 2018-02-16 DIAGNOSIS — B351 Tinea unguium: Secondary | ICD-10-CM

## 2018-02-16 DIAGNOSIS — R7303 Prediabetes: Secondary | ICD-10-CM

## 2018-02-16 DIAGNOSIS — G629 Polyneuropathy, unspecified: Secondary | ICD-10-CM

## 2018-02-16 NOTE — Progress Notes (Signed)
Patient ID: Mary Cline, female   DOB: Jun 09, 1922, 84 y.o.   MRN: [id number]  Subjective: Mary Cline is a 84 y.o. female patient seen today in office with complaint of painful thickened and elongated toenails; unable to trim. Patient denies any changes since last visit with medical problems. Feet are cold and numb like usual. No other issues noted.  Patient Active Problem List   Diagnosis Date Noted  . Dupuytren's contracture of both hands 05/26/2016  . Chronic systolic heart failure ([place]) 04/03/2015  . Essential hypertension 04/03/2015  . Hyperlipidemia 04/03/2015  . Retinal vascular occlusion, unspecified 11/10/2012  . Giant cell arteritis ([place]) 11/10/2012  . Headache(784.0) 11/08/2012  . Visual changes 11/08/2012    Current Outpatient Medications on File Prior to Visit  Medication Sig Dispense Refill  . aspirin 81 MG tablet Take 81 mg by mouth daily.    . carvedilol (COREG) 6.25 MG tablet Take 1 tablet (6.25 mg total) by mouth 2 (two) times daily. 180 tablet 3  . Cyanocobalamin (VITAMIN B 12 PO) Take by mouth every other day.    . ezetimibe (ZETIA) 10 MG tablet Take 10 mg by mouth daily.    . furosemide (LASIX) 20 MG tablet Take 1 tablet (20 mg total) by mouth as needed. For weight gain more that 2 pounds in a day or 5 pounds in a week. 90 tablet 3  . glycerin adult 2 G SUPP Place 1 suppository rectally once as needed for moderate constipation.    . ibandronate (BONIVA) 150 MG tablet Take 150 mg by mouth every 30 (thirty) days. Take in the morning with a full glass of water, on an empty stomach, and do not take anything else by mouth or lie down for the next 30 min.    . irbesartan (AVAPRO) 150 MG tablet Take 1 tablet (150 mg total) daily by mouth. 90 tablet 3  . Magnesium Hydroxide (EQ MILK OF MAGNESIA PO) Take by mouth as needed (PRN for no bowel movement x 2 days).    . Multiple Vitamin (MULTIVITAMIN) tablet Take 1 tablet by mouth daily.    . NONFORMULARY OR  COMPOUNDED ITEM Ketoconazole and fluticasone compounded cream.  Apply as needed to affected areas as needed as directed.    Marland Kitchen omeprazole (PRILOSEC) 40 MG capsule Take 40 mg by mouth daily.    . traMADol (ULTRAM) 50 MG tablet Take 1 tablet (50 mg total) by mouth 3 (three) times daily as needed for severe pain. 20 tablet 0   No current facility-administered medications on file prior to visit.     Allergies  Allergen Reactions  . Fish Allergy Anaphylaxis  . Iodine Anaphylaxis  . Actonel [Risedronate Sodium] Nausea And Vomiting  . Atorvastatin Other (See Comments)    Blisters in mouth.   Blisters in mouth.    . Ciprofloxacin Nausea And Vomiting  . Codeine Other (See Comments)    Headache  Headache  . Corticotropin Other (See Comments)    Confusion  Confusion  . Demeclocycline     Stomach pain  . Dihydrotachysterol     Dizzy confused  . Garamycin [Gentamicin Sulfate] Rash  . Lactose Intolerance (Gi) Other (See Comments)    Intolerance.    . Lactulose     Intolerance.    . Lovastatin Other (See Comments)    blisters blisters  . Macrobid [Nitrofurantoin Monohyd Macro] Nausea And Vomiting  . Meperidine Other (See Comments)    Hypotension  Hypotension  . Other Hives and  Swelling    Melons and strawberries.    . Paxil [Paroxetine Hcl] Nausea Only  . Penicillins Other (See Comments)    Pain.   Pain.    . Rofecoxib Other (See Comments)    Not known Not known  . Sulfa Antibiotics Swelling  . Tetracyclines & Related Other (See Comments)    Stomach pain  . Vitamin D Analogs Other (See Comments)    Dizzy confused    Objective: Physical Exam  General: Well developed, nourished, no acute distress, awake, alert and oriented x 3  Vascular: Dorsalis pedis artery 1/4 bilateral, Posterior tibial artery 0/4 bilateral, skin temperature warm to warm proximal to distal bilateral lower extremities, mild varicosities, decreased pedal hair present bilateral.  Neurological: Gross  sensation present via light touch bilateral. Subjective tingling to toes, likely positional neuropathy  Dermatological: Skin is warm, dry, and supple bilateral, Nails 1-10 are tender, long, thick, and discolored with mild subungal debris, no webspace macerations present bilateral, no open lesions present bilateral, no callus/corns/hyperkeratotic tissue present bilateral. No signs of infection bilateral.  Musculoskeletal: Bunion deformities noted bilateral and lesser hammertoe that is asymptomatic. Muscular strength within normal limits without pain on range of motion. No pain with calf compression bilateral.  Assessment and Plan:  Problem List Items Addressed This Visit    None    Visit Diagnoses    Dermatophytosis of nail    -  Primary   Pre-diabetes       Neuropathy       Toe pain, bilateral       Long term (current) use of anticoagulants         -Examined patient.  -Discussed treatment options for painful mycotic nails. -Mechanically debrided and reduced mycotic nails with sterile nail nipper and dremel nail file without incident. -Patient to return in 2.5 to 3 months for follow up evaluation or sooner if symptoms worsen.  [name], DPM

## 2018-03-09 ENCOUNTER — Encounter: Payer: Self-pay | Admitting: Cardiovascular Disease

## 2018-03-09 ENCOUNTER — Ambulatory Visit: Payer: Medicare PPO | Admitting: Cardiovascular Disease

## 2018-03-09 VITALS — BP 130/71 | HR 74 | Ht <= 58 in | Wt 92.4 lb

## 2018-03-09 DIAGNOSIS — I10 Essential (primary) hypertension: Secondary | ICD-10-CM | POA: Diagnosis not present

## 2018-03-09 DIAGNOSIS — E7800 Pure hypercholesterolemia, unspecified: Secondary | ICD-10-CM

## 2018-03-09 DIAGNOSIS — I5042 Chronic combined systolic (congestive) and diastolic (congestive) heart failure: Secondary | ICD-10-CM

## 2018-03-09 DIAGNOSIS — I2510 Atherosclerotic heart disease of native coronary artery without angina pectoris: Secondary | ICD-10-CM

## 2018-03-09 NOTE — Patient Instructions (Signed)
Medication Instructions:  Your physician recommends that you continue on your current medications as directed. Please refer to the Current Medication list given to you today. If you need a refill on your cardiac medications before your next appointment, please call your pharmacy.   Lab work: NONE   Testing/Procedures: NONE  Follow-Up: At [facility], you and your health needs are our priority.  As part of our continuing mission to provide you with exceptional heart care, we have created designated Provider Care Teams.  These Care Teams include your primary Cardiologist (physician) and Advanced Practice Providers (APPs -  Physician Assistants and Nurse Practitioners) who all work together to provide you with the care you need, when you need it. You will need a follow up appointment in 5 months.  Please call our office 2 months in advance to schedule this appointment.  You may see DR [facility] or one of the following Advanced Practice Providers on your designated Care Team:   Kerin Ransom, PA-C [place], [place] . Sande Rives, PA-C

## 2018-03-09 NOTE — Progress Notes (Signed)
Cardiology Office Note   Date:  03/09/2018   ID:  Mary Cline, DOB 04-19-1922, MRN [id number]  PCP:  [name], MD  Cardiologist:   Skeet Latch, MD   No chief complaint on file.  History of Present Illness: Mary Cline is a 84 y.o. female with hypertension, diabetes, GERD, giant cell arteritis, CLL, chronic systolic heart failure LVEF 15-20%, and LBBB here for follow up.  Mary Cline was seen by [name] (cardiolgist, [facility]) on 11/2014.  At that time she was following up after a hospitalization for systolic heart failure that was likely ischemic.  She was initially hospitalized for shortness of breath and was found to have an NSTEMI.  Troponin peaked at 0.17.  BNP was 33,000. EKG revealed LBBB and no prior EKGs were available for comparison.  Her symptoms improved with diuresis.   Her family decided not to pursue aggressive measures given her age. Mary Cline has been unable to get financial assistance to afford Entresto.  She was switched to irbesartan.    At her last appointment carvedilol was increased due to poorly controlled BP.  She tries to check her BP at home but doesn't have a child size cuff so it has been inaccurate.  It is at goal when she sees the doctor.  She hasn't had any chest pain or shortness of breath.  She denies lower extremity edema, orthopnea or PND.  Her appetite has been good but she doesn't like her low sodium diet.    Past Medical History:  Diagnosis Date  . Anemia   . Arthritis   . Chronic systolic heart failure ([place]) 04/03/2015  . Diabetes mellitus   . Essential hypertension 04/03/2015  . GERD (gastroesophageal reflux disease)   . Hernia   . Hyperlipidemia 04/03/2015  . Hypertension   . Macular degeneration disease     Past Surgical History:  Procedure Laterality Date  . ANGIOPLASTY    . APPENDECTOMY    . HYSTEROTOMY       Current Outpatient Medications  Medication Sig Dispense Refill  .  aspirin 81 MG tablet Take 81 mg by mouth daily.    . carvedilol (COREG) 6.25 MG tablet Take 1 tablet (6.25 mg total) by mouth 2 (two) times daily. 180 tablet 3  . Cyanocobalamin (VITAMIN B 12 PO) Take by mouth every other day.    . ezetimibe (ZETIA) 10 MG tablet Take 10 mg by mouth daily.    . furosemide (LASIX) 20 MG tablet Take 1 tablet (20 mg total) by mouth as needed. For weight gain more that 2 pounds in a day or 5 pounds in a week. 90 tablet 3  . glycerin adult 2 G SUPP Place 1 suppository rectally once as needed for moderate constipation.    . ibandronate (BONIVA) 150 MG tablet Take 150 mg by mouth every 30 (thirty) days. Take in the morning with a full glass of water, on an empty stomach, and do not take anything else by mouth or lie down for the next 30 min.    . irbesartan (AVAPRO) 150 MG tablet Take 1 tablet (150 mg total) daily by mouth. 90 tablet 3  . Magnesium Hydroxide (EQ MILK OF MAGNESIA PO) Take by mouth as needed (PRN for no bowel movement x 2 days).    . Multiple Vitamin (MULTIVITAMIN) tablet Take 1 tablet by mouth daily.    . NONFORMULARY OR COMPOUNDED ITEM Ketoconazole and fluticasone compounded cream.  Apply as needed  to affected areas as needed as directed.    Marland Kitchen omeprazole (PRILOSEC) 40 MG capsule Take 40 mg by mouth daily.    . traMADol (ULTRAM) 50 MG tablet Take 1 tablet (50 mg total) by mouth 3 (three) times daily as needed for severe pain. 20 tablet 0   No current facility-administered medications for this visit.     Allergies:   Fish allergy; Iodine; Actonel [risedronate sodium]; Atorvastatin; Ciprofloxacin; Codeine; Corticotropin; Demeclocycline; Dihydrotachysterol; Garamycin [gentamicin sulfate]; Lactose intolerance (gi); Lactulose; Lovastatin; Macrobid [nitrofurantoin monohyd macro]; Meperidine; Other; Paxil [paroxetine hcl]; Penicillins; Rofecoxib; Sulfa antibiotics; Tetracyclines & related; and Vitamin d analogs    Social History:  The patient  reports that she  has never smoked. She has never used smokeless tobacco. She reports that she does not drink alcohol or use drugs.   Family History:  The patient's family history includes Cancer in her sister; Diabetes in her brother, brother, brother, and maternal grandmother; Heart Problems in her daughter, daughter, maternal grandfather, paternal grandfather, and paternal grandmother.    ROS:  Please see the history of present illness.   Otherwise, review of systems are positive for pain in feet and memory loss.   All other systems are reviewed and negative.    PHYSICAL EXAM: VS:  BP 130/71   Pulse 74   Ht 4\' 8"  (1.422 m)   Wt 92 lb 6.4 oz (41.9 kg)   SpO2 95%   BMI 20.72 kg/m  , BMI Body mass index is 20.72 kg/m. GENERAL:  Well appearing HEENT: Pupils equal round and reactive, fundi not visualized, oral mucosa unremarkable NECK:  No jugular venous distention, waveform within normal limits, carotid upstroke brisk and symmetric, no bruits, no thyromegaly LUNGS:  Clear to auscultation bilaterally HEART:  RRR.  PMI not displaced or sustained,S1 and S2 within normal limits, no S3, no S4, no clicks, no rubs, no murmurs ABD:  Flat, positive bowel sounds normal in frequency in pitch, no bruits, no rebound, no guarding, no midline pulsatile mass, no hepatomegaly, no splenomegaly EXT:  2 plus pulses throughout, no edema, no cyanosis no clubbing SKIN:  No rashes no nodules NEURO:  Cranial nerves II through XII grossly intact, motor grossly intact throughout PSYCH:  Cognitively intact, oriented to person place and time   EKG:  EKG is ordered today. 11/23/15: Sinus rhythm.  Rate 76 bpm.  LBBB. The ekg ordered 04/03/15 demonstrates sinus rhythm.  LBBB.  10/07/16: Sinus rhythm rate 69 bpm.  LBBB.  04/27/17: Sinus rhythm.  Rate 68 bpm.  Left bundle branch block.  12/15/17: Sinus rhythm.  Rate 76 bpm.  LBBB  Echo 12/18/14: LVEF 40-08%  Grade 3 diastolic dysfunction.  Mod-severely dilated LA.  RA mildly dilated.  Mild  aortic valve sclerosis without stenosis.  Moderate mitral valve thickening.  Mild TR.    Recent Labs: 04/14/2017: Hemoglobin 11.7; Platelet Count 130 08/11/2017: ALT 13; BUN 27; Creatinine, Ser 0.74; Potassium 5.2; Sodium 142   05/20/16:  Sodium 141, potassium 4.6, BUN 21, creatinine 1.05 Hemoglobin A1c 6.3% AST 13, ALT 9 Total cholesterol 208, triglycerides 93, HDL 61, LDL 129  10/27/17: Total cholesterol 157, triglyceride 74, HDL 51, LDL 91  Lipid Panel    Component Value Date/Time   CHOL 138 08/11/2017 1028   TRIG 71 08/11/2017 1028   HDL 53 08/11/2017 1028   CHOLHDL 2.6 08/11/2017 1028   CHOLHDL 3.8 09/13/2009 0620   VLDL 22 09/13/2009 0620   LDLCALC 71 08/11/2017 1028  Wt Readings from Last 3 Encounters:  03/09/18 92 lb 6.4 oz (41.9 kg)  12/15/17 93 lb 12.8 oz (42.5 kg)  04/27/17 95 lb 6.4 oz (43.3 kg)      ASSESSMENT AND PLAN:  # Chronic systolic heart failure: # Hypertension:  Mary Cline continues to do well and is euvolemic.  BP well-controlled. Continue carvedilol, irbesartan, and furosemide.  # CAD s/p NSTEMI: Mary Cline had a mild NSTEMI 11/2014 that has been medically managed.  She has not experienced any chest pain and her troponin elevation was most consistent with demand ischemia in the setting of a BNP of 33,000.  Continue aspirin and increase carvedilol as above.  She hasn't tolerated statins in the past.  Continue Zetia.  Current medicines are reviewed at length with the patient today.  The patient does not have concerns regarding medicines.  The following changes have been made: none  Labs/ tests ordered today include:   No orders of the defined types were placed in this encounter.    Disposition:   FU with Handy Mcloud C. Oval Linsey, MD, [facility] in 5 months.     Signed, Greydis Stlouis C. Oval Linsey, MD, [facility]  03/09/2018 1:43 PM    [facility]

## 2018-04-06 DIAGNOSIS — R0781 Pleurodynia: Secondary | ICD-10-CM | POA: Diagnosis not present

## 2018-04-06 DIAGNOSIS — M25551 Pain in right hip: Secondary | ICD-10-CM | POA: Diagnosis not present

## 2018-04-09 NOTE — Progress Notes (Signed)
Marland Kitchen    HEMATOLOGY/ONCOLOGY CLINIC NOTE  Date of Service: 04/12/2018    Patient Care Team: Rankins, Bill Salinas, MD as PCP - General (Family Medicine)  CHIEF COMPLAINTS/PURPOSE OF CONSULTATION:  F/u for CLL  HISTORY OF PRESENTING ILLNESS:   Mary Cline is a wonderful 85 y.o. female who has been referred to [place] by Dr [name], Bill Salinas, MD  for evaluation and management of lymphocytosis.  Patient has a history of hypertension, diabetes, dyslipidemia, chronic systolic CHF who on recent labs with her primary care physician on 10/16/2015 was noted to have leukocytosis with WBC count of 12.6k with a lymphocyte count of 9.5k and normal hemoglobin of 12.6 with an MCV of 93 and mild thrombocytopenia of 139k.   She had a review of her peripheral blood smear by pathology-day showed absolute lymphocytosis and atypical lymphocytes.  Patient was referred to [place] for further evaluation and management of her lymphocytosis. She reports no fevers no chills no night sweats no unintentional weight loss. Borderline enlarged lymph nodes in left upper neck. No abdominal pain or distention.  Patient notes no other acute new symptoms in the last 3-6 months. Labs were done for routine follow-up. Patient is here with her daughter who helps with providing information as well.  Labs done in clinic today showed a CBC with a hemoglobin within normal limits at 12.7, platelet count slightly decreased at 125k, total WBC count of 9.8k with 7.7k lymphocytes.  INTERVAL HISTORY   Mary Cline is here for her scheduled follow up for CLL. The patient's last visit with [place] was on 04/14/17. She is accompanied today by her daughter. The pt reports that she is doing well overall.   The pt reports that she has not developed any new concerns in the past year. She notes that she is eating well and has had stable weight. The pt notes that her energy levels are stable overall. She denies noticing any new lumps or bumps or  skin rashes. She denies any fevers, chills, or night sweats.  Lab results today (04/12/18) of CBC w/diff and CMP is as follows: all values are WNL except for WBC at 15.8k, RBC at 3.73, HGB at 11.4, Lymphs abs at 12.0k, Glucose at 107, Albumin at 3.4, AST at 14. 04/12/18 LDH normal at 184  On review of systems, pt reports eating well, stable energy levels, stable weight, some constipation, and denies fevers, chills, night sweats, unexpected weight loss, abdominal pains, leg swelling, and any other symptoms.    MEDICAL HISTORY:  Past Medical History:  Diagnosis Date  . Anemia   . Arthritis   . Chronic systolic heart failure ([place]) 04/03/2015  . Diabetes mellitus   . Essential hypertension 04/03/2015  . GERD (gastroesophageal reflux disease)   . Hernia   . Hyperlipidemia 04/03/2015  . Hypertension   . Macular degeneration disease     SURGICAL HISTORY: Past Surgical History:  Procedure Laterality Date  . ANGIOPLASTY    . APPENDECTOMY    . HYSTEROTOMY      SOCIAL HISTORY: Social History   Socioeconomic History  . Marital status: Widowed    Spouse name: Not on file  . Number of children: 4  . Years of education: Therapist, sports   . Highest education level: Not on file  Occupational History  . Not on file  Social Needs  . Financial resource strain: Not on file  . Food insecurity:    Worry: Not on file    Inability: Not on file  .  Transportation needs:    Medical: Not on file    Non-medical: Not on file  Tobacco Use  . Smoking status: Never Smoker  . Smokeless tobacco: Never Used  Substance and Sexual Activity  . Alcohol use: No  . Drug use: No  . Sexual activity: Not on file  Lifestyle  . Physical activity:    Days per week: Not on file    Minutes per session: Not on file  . Stress: Not on file  Relationships  . Social connections:    Talks on phone: Not on file    Gets together: Not on file    Attends religious service: Not on file    Active member of club or organization: Not  on file    Attends meetings of clubs or organizations: Not on file    Relationship status: Not on file  . Intimate partner violence:    Fear of current or ex partner: Not on file    Emotionally abused: Not on file    Physically abused: Not on file    Forced sexual activity: Not on file  Other Topics Concern  . Not on file  Social History Narrative   Patient lives at home with her daughter.    Patient is retired.    Patient is a Education administrator.    Patient was an RN    Patient has 4 children.             Epworth Sleepiness Scale = 10 (as of 04/03/2015)    FAMILY HISTORY: Family History  Problem Relation Age of Onset  . Cancer Sister        Breast cancer  . Diabetes Brother   . Diabetes Maternal Grandmother   . Heart Problems Maternal Grandfather   . Heart Problems Paternal Grandmother   . Heart Problems Paternal Grandfather   . Diabetes Brother   . Diabetes Brother   . Heart Problems Daughter   . Heart Problems Daughter     ALLERGIES:  is allergic to fish allergy; iodine; actonel [risedronate sodium]; atorvastatin; ciprofloxacin; codeine; corticotropin; demeclocycline; dihydrotachysterol; garamycin [gentamicin sulfate]; lactose intolerance (gi); lactulose; lovastatin; macrobid [nitrofurantoin monohyd macro]; meperidine; other; paxil [paroxetine hcl]; penicillins; rofecoxib; sulfa antibiotics; tetracyclines & related; and vitamin d analogs.  MEDICATIONS:   Current Outpatient Medications  Medication Sig Dispense Refill  . aspirin 81 MG tablet Take 81 mg by mouth daily.    . B Complex Vitamins (VITAMIN B COMPLEX PO) Take 1 tablet by mouth. 3 times a week    . carvedilol (COREG) 6.25 MG tablet Take 1 tablet (6.25 mg total) by mouth 2 (two) times daily. 180 tablet 3  . Cyanocobalamin (VITAMIN B 12 PO) Take by mouth every other day.    . ezetimibe (ZETIA) 10 MG tablet Take 10 mg by mouth daily.    . furosemide (LASIX) 20 MG tablet Take 1 tablet (20 mg total) by mouth as needed. For  weight gain more that 2 pounds in a day or 5 pounds in a week. 90 tablet 3  . glycerin adult 2 G SUPP Place 1 suppository rectally once as needed for moderate constipation.    . ibandronate (BONIVA) 150 MG tablet Take 150 mg by mouth every 30 (thirty) days. Take in the morning with a full glass of water, on an empty stomach, and do not take anything else by mouth or lie down for the next 30 min.    . irbesartan (AVAPRO) 150 MG tablet Take 1 tablet (150  mg total) daily by mouth. 90 tablet 3  . Magnesium Hydroxide (EQ MILK OF MAGNESIA PO) Take by mouth as needed (PRN for no bowel movement x 2 days).    . Multiple Vitamin (MULTIVITAMIN) tablet Take 1 tablet by mouth daily.    . NONFORMULARY OR COMPOUNDED ITEM Ketoconazole and fluticasone compounded cream.  Apply as needed to affected areas as needed as directed.    Marland Kitchen omeprazole (PRILOSEC) 40 MG capsule Take 40 mg by mouth daily.    . traMADol (ULTRAM) 50 MG tablet Take 1 tablet (50 mg total) by mouth 3 (three) times daily as needed for severe pain. 20 tablet 0   No current facility-administered medications for this visit.     REVIEW OF SYSTEMS:    A 10+ POINT REVIEW OF SYSTEMS WAS OBTAINED including neurology, dermatology, psychiatry, cardiac, respiratory, lymph, extremities, GI, GU, Musculoskeletal, constitutional, breasts, reproductive, HEENT.  All pertinent positives are noted in the HPI.  All others are negative.   PHYSICAL EXAMINATION: ECOG PERFORMANCE STATUS: 2 - Symptomatic, <50% confined to bed  . Vitals:   04/12/18 1428  BP: 132/70  Pulse: 74  Resp: 18  Temp: 98.1 F (36.7 C)  SpO2: 97%   Filed Weights   04/12/18 1428  Weight: 91 lb 4.8 oz (41.4 kg)   .Body mass index is 20.47 kg/m.  GENERAL:alert, in no acute distress and comfortable SKIN: no acute rashes, no significant lesions EYES: conjunctiva are pink and non-injected, sclera anicteric OROPHARYNX: MMM, no exudates, no oropharyngeal erythema or ulceration NECK:  supple, no JVD LYMPH:  Small left upper cervical lymph node, no palpable lymphadenopathy in the axillary or inguinal regions LUNGS: clear to auscultation b/l with normal respiratory effort HEART: regular rate & rhythm ABDOMEN:  normoactive bowel sounds , non tender, not distended. No palpable hepatosplenomegaly.  Extremity: no pedal edema PSYCH: alert & oriented x 3 with fluent speech NEURO: no focal motor/sensory deficits   LABORATORY DATA:  I have reviewed the data as listed  . CBC Latest Ref Rng & Units 04/12/2018 04/14/2017 10/14/2016  WBC 4.0 - 10.5 K/uL 15.8(H) 15.6(H) 9.6  Hemoglobin 12.0 - 15.0 g/dL 11.4(L) 11.7 12.4  Hematocrit 36.0 - 46.0 % 36.0 36.4 37.5  Platelets 150 - 400 K/uL 169 130(L) 142(L)   . CBC    Component Value Date/Time   WBC 15.8 (H) 04/12/2018 1457   RBC 3.73 (L) 04/12/2018 1457   HGB 11.4 (L) 04/12/2018 1457   HGB 11.7 04/14/2017 1414   HGB 12.4 10/14/2016 1404   HCT 36.0 04/12/2018 1457   HCT 37.5 10/14/2016 1404   PLT 169 04/12/2018 1457   PLT 130 (L) 04/14/2017 1414   PLT 142 (L) 10/14/2016 1404   MCV 96.5 04/12/2018 1457   MCV 94.6 10/14/2016 1404   MCH 30.6 04/12/2018 1457   MCHC 31.7 04/12/2018 1457   RDW 12.4 04/12/2018 1457   RDW 13.5 10/14/2016 1404   LYMPHSABS 12.0 (H) 04/12/2018 1457   LYMPHSABS 6.8 (H) 10/14/2016 1404   MONOABS 0.5 04/12/2018 1457   MONOABS 0.3 10/14/2016 1404   EOSABS 0.1 04/12/2018 1457   EOSABS 0.1 10/14/2016 1404   BASOSABS 0.1 04/12/2018 1457   BASOSABS 0.0 10/14/2016 1404    . CMP Latest Ref Rng & Units 04/12/2018 08/11/2017 04/14/2017  Glucose 70 - 99 mg/dL 107(H) 122(H) 108  BUN 8 - 23 mg/dL 21 27 24   Creatinine 0.44 - 1.00 mg/dL 0.81 0.74 1.21(H)  Sodium 135 - 145 mmol/L 141 142 140  Potassium 3.5 - 5.1 mmol/L 4.5 5.2 5.0(H)  Chloride 98 - 111 mmol/L 105 104 104  CO2 22 - 32 mmol/L 28 25 30(H)  Calcium 8.9 - 10.3 mg/dL 8.9 8.9 9.1  Total Protein 6.5 - 8.1 g/dL 6.6 6.0 6.3(L)  Total Bilirubin 0.3 -  1.2 mg/dL 0.5 0.4 0.5  Alkaline Phos 38 - 126 U/L 67 63 70  AST 15 - 41 U/L 14(L) 19 15  ALT 0 - 44 U/L 10 13 14          RADIOGRAPHIC STUDIES: I have personally reviewed the radiological images as listed and agreed with the findings in the report. No results found.  ASSESSMENT & PLAN:   85 y.o. Caucasian female with  #1 Rai Stage 1 CLL -- currently no indications for treatment. Biallelic deletion of 16L Labs stable No significant anemia at this time Mild thrombocytopenia 130k . No bleeding. Lymphocyte counts stable- 9.4k --> 5.3k ---> 6.8k has increased to 12.5K as of 04/14/17  PLAN:  -Discussed pt labwork today, 04/12/18; counts and chemistries are stable, LDH normal at 184.  -The pt shows no clinical or lab progression of CLL at this time.  -No indication for further treatment at this time.   -Patient currently has no constitutional symptoms no significant anemia or thrombocytopenia no significant lymphadenopathy or symptomatic hepatosplenomegaly at this time.  -No clear indication for treatment of the patient's CLL at this time. -She will continue to follow with her primary care physician for management of her other multiple medical comorbidities. -As her labs continue to be overall stable she can alternate her 6 months visit with her PCP, Dr. [name], and our clinic. If any acute concerns occur we can see her sooner.  -Will see the pt back in one year   RTC with Dr Irene Limbo in 1 yr with labs   All of the patients and her daughters questions were answered with apparent satisfaction. The patient knows to call the clinic with any problems, questions or concerns.  The total time spent in the appt was 20 minutes and more than 50% was on counseling and direct patient cares.    Sullivan Lone MD [place] AAHIVMS [facility] Hematology/Oncology Physician [facility]  (Office):       [phone] (Work cell):  [phone] (Fax):           [phone]  I, Baldwin Jamaica, am  acting as a scribe for Dr. Sullivan Lone.   .I have reviewed the above documentation for accuracy and completeness, and I agree with the above. Brunetta Genera MD

## 2018-04-12 ENCOUNTER — Inpatient Hospital Stay: Payer: Medicare PPO | Attending: Hematology | Admitting: Hematology

## 2018-04-12 ENCOUNTER — Inpatient Hospital Stay: Payer: Medicare PPO

## 2018-04-12 VITALS — BP 132/70 | HR 74 | Temp 98.1°F | Resp 18 | Ht <= 58 in | Wt 91.3 lb

## 2018-04-12 DIAGNOSIS — C9110 Chronic lymphocytic leukemia of B-cell type not having achieved remission: Secondary | ICD-10-CM | POA: Insufficient documentation

## 2018-04-12 DIAGNOSIS — Z7982 Long term (current) use of aspirin: Secondary | ICD-10-CM

## 2018-04-12 DIAGNOSIS — Z803 Family history of malignant neoplasm of breast: Secondary | ICD-10-CM | POA: Diagnosis not present

## 2018-04-12 DIAGNOSIS — Z79899 Other long term (current) drug therapy: Secondary | ICD-10-CM | POA: Diagnosis not present

## 2018-04-12 LAB — CBC WITH DIFFERENTIAL/PLATELET
Abs Immature Granulocytes: 0.03 10*3/uL (ref 0.00–0.07)
Basophils Absolute: 0.1 10*3/uL (ref 0.0–0.1)
Basophils Relative: 0 %
EOS PCT: 1 %
Eosinophils Absolute: 0.1 10*3/uL (ref 0.0–0.5)
HEMATOCRIT: 36 % (ref 36.0–46.0)
Hemoglobin: 11.4 g/dL — ABNORMAL LOW (ref 12.0–15.0)
IMMATURE GRANULOCYTES: 0 %
LYMPHS ABS: 12 10*3/uL — AB (ref 0.7–4.0)
LYMPHS PCT: 77 %
MCH: 30.6 pg (ref 26.0–34.0)
MCHC: 31.7 g/dL (ref 30.0–36.0)
MCV: 96.5 fL (ref 80.0–100.0)
Monocytes Absolute: 0.5 10*3/uL (ref 0.1–1.0)
Monocytes Relative: 3 %
NEUTROS ABS: 3.1 10*3/uL (ref 1.7–7.7)
NEUTROS PCT: 19 %
NRBC: 0 % (ref 0.0–0.2)
Platelets: 169 10*3/uL (ref 150–400)
RBC: 3.73 MIL/uL — ABNORMAL LOW (ref 3.87–5.11)
RDW: 12.4 % (ref 11.5–15.5)
WBC: 15.8 10*3/uL — AB (ref 4.0–10.5)

## 2018-04-12 LAB — CMP (CANCER CENTER ONLY)
ALT: 10 U/L (ref 0–44)
AST: 14 U/L — ABNORMAL LOW (ref 15–41)
Albumin: 3.4 g/dL — ABNORMAL LOW (ref 3.5–5.0)
Alkaline Phosphatase: 67 U/L (ref 38–126)
Anion gap: 8 (ref 5–15)
BUN: 21 mg/dL (ref 8–23)
CHLORIDE: 105 mmol/L (ref 98–111)
CO2: 28 mmol/L (ref 22–32)
CREATININE: 0.81 mg/dL (ref 0.44–1.00)
Calcium: 8.9 mg/dL (ref 8.9–10.3)
GFR, Estimated: >60 mL/min (ref >60)
Glucose, Bld: 107 mg/dL — ABNORMAL HIGH (ref 70–99)
POTASSIUM: 4.5 mmol/L (ref 3.5–5.1)
SODIUM: 141 mmol/L (ref 135–145)
Total Bilirubin: 0.5 mg/dL (ref 0.3–1.2)
Total Protein: 6.6 g/dL (ref 6.5–8.1)

## 2018-04-12 LAB — LACTATE DEHYDROGENASE: LDH: 184 U/L (ref 98–192)

## 2018-04-12 NOTE — Patient Instructions (Signed)
Thank you for choosing [facility] to provide your oncology and hematology care.  To afford each patient quality time with our providers, please arrive 30 minutes before your scheduled appointment time.  If you arrive late for your appointment, you may be asked to reschedule.  We strive to give you quality time with our providers, and arriving late affects you and other patients whose appointments are after yours.    If you are a no show for multiple scheduled visits, you may be dismissed from the clinic at the providers discretion.     Again, thank you for choosing [facility], our hope is that these requests will decrease the amount of time that you wait before being seen by our physicians.  ______________________________________________________________________   Should you have questions after your visit to the [facility], please contact our office at [phone] between the hours of 8:30 and 4:30 p.m.    Voicemails left after 4:30p.m will not be returned until the following business day.     For prescription refill requests, please have your pharmacy contact us directly.  Please also try to allow 48 hours for prescription requests.     Please contact the scheduling department for questions regarding scheduling.  For scheduling of procedures such as PET scans, CT scans, MRI, Ultrasound, etc please contact central scheduling at [phone].     Resources For Cancer Patients and Caregivers:    Oncolink.org:  A wonderful resource for patients and healthcare providers for information regarding your disease, ways to tract your treatment, what to expect, etc.      American Cancer Society:  800-227-2345  Can help patients locate various types of support and financial assistance   Cancer Care: 1-800-813-HOPE (4673) Provides financial assistance, online support groups, medication/co-pay assistance.     [place] DSS:  336-641-3447 Where to apply  for food stamps, Medicaid, and utility assistance   Medicare Rights Center: 800-333-4114 Helps people with Medicare understand their rights and benefits, navigate the Medicare system, and secure the quality healthcare they deserve   SCAT: 336-333-6589 [place] [employer]'s shared-ride transportation service for eligible riders who have a disability that prevents them from riding the fixed route bus.     For additional information on assistance programs please contact our social worker:   Mary Cline:  [phone]  

## 2018-04-13 ENCOUNTER — Telehealth: Payer: Self-pay

## 2018-04-13 NOTE — Telephone Encounter (Signed)
Left a detailed voice msg on Mary Cline (patient daughter) telephone line of the patient return appointment date and time. Per 1/13 los. Mailed a letter with a calender enclosed

## 2018-05-18 ENCOUNTER — Ambulatory Visit: Payer: Medicare PPO | Admitting: Sports Medicine

## 2018-05-18 ENCOUNTER — Encounter: Payer: Self-pay | Admitting: Sports Medicine

## 2018-05-18 DIAGNOSIS — M79674 Pain in right toe(s): Secondary | ICD-10-CM | POA: Diagnosis not present

## 2018-05-18 DIAGNOSIS — B351 Tinea unguium: Secondary | ICD-10-CM

## 2018-05-18 DIAGNOSIS — R7303 Prediabetes: Secondary | ICD-10-CM

## 2018-05-18 DIAGNOSIS — M79675 Pain in left toe(s): Secondary | ICD-10-CM

## 2018-05-18 DIAGNOSIS — G629 Polyneuropathy, unspecified: Secondary | ICD-10-CM

## 2018-05-18 NOTE — Progress Notes (Signed)
Patient ID: Mary Cline, female   DOB: 1922-06-19, 85 y.o.   MRN: [id number]  Subjective: Mary Cline is a 85 y.o. female patient seen today in office with complaint of painful thickened and elongated toenails; unable to trim. Patient denies any changes since last visit with medical problems. Feet are cold and numb like usual.  Patient reports that she feels cold while waiting this visit for treatment.  Patient is assisted by daughter this visit.  No other issues noted.  Patient Active Problem List   Diagnosis Date Noted  . Dupuytren's contracture of both hands 05/26/2016  . Chronic systolic heart failure ([place]) 04/03/2015  . Essential hypertension 04/03/2015  . Hyperlipidemia 04/03/2015  . Retinal vascular occlusion, unspecified 11/10/2012  . Giant cell arteritis ([place]) 11/10/2012  . Headache(784.0) 11/08/2012  . Visual changes 11/08/2012    Current Outpatient Medications on File Prior to Visit  Medication Sig Dispense Refill  . aspirin 81 MG tablet Take 81 mg by mouth daily.    . B Complex Vitamins (VITAMIN B COMPLEX PO) Take 1 tablet by mouth. 3 times a week    . carvedilol (COREG) 6.25 MG tablet Take 1 tablet (6.25 mg total) by mouth 2 (two) times daily. 180 tablet 3  . Cyanocobalamin (VITAMIN B 12 PO) Take by mouth every other day.    . ezetimibe (ZETIA) 10 MG tablet Take 10 mg by mouth daily.    . furosemide (LASIX) 20 MG tablet Take 1 tablet (20 mg total) by mouth as needed. For weight gain more that 2 pounds in a day or 5 pounds in a week. 90 tablet 3  . glycerin adult 2 G SUPP Place 1 suppository rectally once as needed for moderate constipation.    . ibandronate (BONIVA) 150 MG tablet Take 150 mg by mouth every 30 (thirty) days. Take in the morning with a full glass of water, on an empty stomach, and do not take anything else by mouth or lie down for the next 30 min.    . irbesartan (AVAPRO) 150 MG tablet Take 1 tablet (150 mg total) daily by mouth. 90 tablet 3  .  Magnesium Hydroxide (EQ MILK OF MAGNESIA PO) Take by mouth as needed (PRN for no bowel movement x 2 days).    . Multiple Vitamin (MULTIVITAMIN) tablet Take 1 tablet by mouth daily.    . NONFORMULARY OR COMPOUNDED ITEM Ketoconazole and fluticasone compounded cream.  Apply as needed to affected areas as needed as directed.    Marland Kitchen omeprazole (PRILOSEC) 40 MG capsule Take 40 mg by mouth daily.    . traMADol (ULTRAM) 50 MG tablet Take 1 tablet (50 mg total) by mouth 3 (three) times daily as needed for severe pain. 20 tablet 0   No current facility-administered medications on file prior to visit.     Allergies  Allergen Reactions  . Fish Allergy Anaphylaxis  . Iodine Anaphylaxis  . Actonel [Risedronate Sodium] Nausea And Vomiting  . Atorvastatin Other (See Comments)    Blisters in mouth.   Blisters in mouth.    . Ciprofloxacin Nausea And Vomiting  . Codeine Other (See Comments)    Headache  Headache  . Corticotropin Other (See Comments)    Confusion  Confusion  . Demeclocycline     Stomach pain  . Dihydrotachysterol     Dizzy confused  . Garamycin [Gentamicin Sulfate] Rash  . Lactose Intolerance (Gi) Other (See Comments)    Intolerance.    . Lactulose  Intolerance.    . Lovastatin Other (See Comments)    blisters blisters  . Macrobid [Nitrofurantoin Monohyd Macro] Nausea And Vomiting  . Meperidine Other (See Comments)    Hypotension  Hypotension  . Other Hives and Swelling    Melons and strawberries.    . Paxil [Paroxetine Hcl] Nausea Only  . Penicillins Other (See Comments)    Pain.   Pain.    . Rofecoxib Other (See Comments)    Not known Not known  . Sulfa Antibiotics Swelling  . Tetracyclines & Related Other (See Comments)    Stomach pain  . Vitamin D Analogs Other (See Comments)    Dizzy confused    Objective: Physical Exam  General: Well developed, nourished, no acute distress, awake, alert and oriented x 3 hard of hearing  Vascular: Dorsalis pedis  artery 1/4 bilateral, Posterior tibial artery 0/4 bilateral, skin temperature warm to warm proximal to distal bilateral lower extremities, mild varicosities, decreased pedal hair present bilateral.  Neurological: Gross sensation present via light touch bilateral. Subjective tingling to toes, likely positional neuropathy  Dermatological: Skin is warm, dry, and supple bilateral, Nails 1-10 are tender, long, thick, and discolored with mild subungal debris, no webspace macerations present bilateral, no open lesions present bilateral, no callus/corns/hyperkeratotic tissue present bilateral. No signs of infection bilateral.  Musculoskeletal: Bunion deformities noted bilateral and lesser hammertoe that is asymptomatic. Muscular strength within normal limits without pain on range of motion. No pain with calf compression bilateral.  Assessment and Plan:  Problem List Items Addressed This Visit    None    Visit Diagnoses    Dermatophytosis of nail    -  Primary   Pre-diabetes       Neuropathy       Toe pain, bilateral         -Examined patient.  -Discussed treatment options for painful mycotic nails. -Mechanically debrided and reduced mycotic nails with sterile nail nipper and dremel nail file without incident. -Patient to return in 2.5 to 3 months for follow up evaluation or sooner if symptoms worsen.  [name], DPM

## 2018-06-08 DIAGNOSIS — E1140 Type 2 diabetes mellitus with diabetic neuropathy, unspecified: Secondary | ICD-10-CM | POA: Diagnosis not present

## 2018-06-08 DIAGNOSIS — I10 Essential (primary) hypertension: Secondary | ICD-10-CM | POA: Diagnosis not present

## 2018-06-08 DIAGNOSIS — Z0000 Encounter for general adult medical examination without abnormal findings: Secondary | ICD-10-CM | POA: Diagnosis not present

## 2018-06-08 DIAGNOSIS — K219 Gastro-esophageal reflux disease without esophagitis: Secondary | ICD-10-CM | POA: Diagnosis not present

## 2018-06-08 DIAGNOSIS — E559 Vitamin D deficiency, unspecified: Secondary | ICD-10-CM | POA: Diagnosis not present

## 2018-06-08 DIAGNOSIS — Z9181 History of falling: Secondary | ICD-10-CM | POA: Diagnosis not present

## 2018-06-08 DIAGNOSIS — E785 Hyperlipidemia, unspecified: Secondary | ICD-10-CM | POA: Diagnosis not present

## 2018-06-08 DIAGNOSIS — D519 Vitamin B12 deficiency anemia, unspecified: Secondary | ICD-10-CM | POA: Diagnosis not present

## 2018-06-08 DIAGNOSIS — F320 Major depressive disorder, single episode, mild: Secondary | ICD-10-CM | POA: Diagnosis not present

## 2018-06-08 DIAGNOSIS — M810 Age-related osteoporosis without current pathological fracture: Secondary | ICD-10-CM | POA: Diagnosis not present

## 2018-06-10 ENCOUNTER — Other Ambulatory Visit: Payer: Self-pay

## 2018-06-10 ENCOUNTER — Emergency Department (HOSPITAL_COMMUNITY): Payer: Medicare PPO

## 2018-06-10 ENCOUNTER — Encounter (HOSPITAL_COMMUNITY): Payer: Self-pay

## 2018-06-10 ENCOUNTER — Inpatient Hospital Stay (HOSPITAL_COMMUNITY)
Admission: EM | Admit: 2018-06-10 | Discharge: 2018-06-13 | DRG: 291 | Disposition: A | Discharge status: Home Health | Payer: Medicare PPO | Attending: Internal Medicine | Admitting: Internal Medicine

## 2018-06-10 DIAGNOSIS — Z681 Body mass index (BMI) 19 or less, adult: Secondary | ICD-10-CM

## 2018-06-10 DIAGNOSIS — Z888 Allergy status to other drugs, medicaments and biological substances status: Secondary | ICD-10-CM

## 2018-06-10 DIAGNOSIS — I2510 Atherosclerotic heart disease of native coronary artery without angina pectoris: Secondary | ICD-10-CM | POA: Diagnosis present

## 2018-06-10 DIAGNOSIS — Z881 Allergy status to other antibiotic agents status: Secondary | ICD-10-CM

## 2018-06-10 DIAGNOSIS — I428 Other cardiomyopathies: Secondary | ICD-10-CM | POA: Diagnosis present

## 2018-06-10 DIAGNOSIS — I5043 Acute on chronic combined systolic (congestive) and diastolic (congestive) heart failure: Secondary | ICD-10-CM | POA: Diagnosis not present

## 2018-06-10 DIAGNOSIS — I248 Other forms of acute ischemic heart disease: Secondary | ICD-10-CM | POA: Diagnosis present

## 2018-06-10 DIAGNOSIS — I214 Non-ST elevation (NSTEMI) myocardial infarction: Secondary | ICD-10-CM

## 2018-06-10 DIAGNOSIS — I10 Essential (primary) hypertension: Secondary | ICD-10-CM | POA: Diagnosis present

## 2018-06-10 DIAGNOSIS — J90 Pleural effusion, not elsewhere classified: Secondary | ICD-10-CM | POA: Diagnosis not present

## 2018-06-10 DIAGNOSIS — Z91013 Allergy to seafood: Secondary | ICD-10-CM

## 2018-06-10 DIAGNOSIS — E739 Lactose intolerance, unspecified: Secondary | ICD-10-CM | POA: Diagnosis present

## 2018-06-10 DIAGNOSIS — I447 Left bundle-branch block, unspecified: Secondary | ICD-10-CM | POA: Diagnosis present

## 2018-06-10 DIAGNOSIS — R109 Unspecified abdominal pain: Secondary | ICD-10-CM | POA: Diagnosis present

## 2018-06-10 DIAGNOSIS — K219 Gastro-esophageal reflux disease without esophagitis: Secondary | ICD-10-CM | POA: Diagnosis present

## 2018-06-10 DIAGNOSIS — Z880 Allergy status to penicillin: Secondary | ICD-10-CM

## 2018-06-10 DIAGNOSIS — Z856 Personal history of leukemia: Secondary | ICD-10-CM

## 2018-06-10 DIAGNOSIS — R1013 Epigastric pain: Secondary | ICD-10-CM | POA: Diagnosis not present

## 2018-06-10 DIAGNOSIS — Z833 Family history of diabetes mellitus: Secondary | ICD-10-CM

## 2018-06-10 DIAGNOSIS — I110 Hypertensive heart disease with heart failure: Secondary | ICD-10-CM | POA: Diagnosis not present

## 2018-06-10 DIAGNOSIS — I083 Combined rheumatic disorders of mitral, aortic and tricuspid valves: Secondary | ICD-10-CM | POA: Diagnosis present

## 2018-06-10 DIAGNOSIS — Z79899 Other long term (current) drug therapy: Secondary | ICD-10-CM

## 2018-06-10 DIAGNOSIS — I249 Acute ischemic heart disease, unspecified: Secondary | ICD-10-CM | POA: Insufficient documentation

## 2018-06-10 DIAGNOSIS — E119 Type 2 diabetes mellitus without complications: Secondary | ICD-10-CM | POA: Diagnosis present

## 2018-06-10 DIAGNOSIS — I5023 Acute on chronic systolic (congestive) heart failure: Secondary | ICD-10-CM | POA: Diagnosis present

## 2018-06-10 DIAGNOSIS — R350 Frequency of micturition: Secondary | ICD-10-CM | POA: Diagnosis present

## 2018-06-10 DIAGNOSIS — R1084 Generalized abdominal pain: Secondary | ICD-10-CM | POA: Diagnosis not present

## 2018-06-10 DIAGNOSIS — R7989 Other specified abnormal findings of blood chemistry: Secondary | ICD-10-CM | POA: Diagnosis not present

## 2018-06-10 DIAGNOSIS — J9811 Atelectasis: Secondary | ICD-10-CM | POA: Diagnosis present

## 2018-06-10 DIAGNOSIS — D649 Anemia, unspecified: Secondary | ICD-10-CM | POA: Diagnosis present

## 2018-06-10 DIAGNOSIS — E785 Hyperlipidemia, unspecified: Secondary | ICD-10-CM | POA: Diagnosis present

## 2018-06-10 DIAGNOSIS — I252 Old myocardial infarction: Secondary | ICD-10-CM

## 2018-06-10 DIAGNOSIS — M1990 Unspecified osteoarthritis, unspecified site: Secondary | ICD-10-CM | POA: Diagnosis present

## 2018-06-10 DIAGNOSIS — Z882 Allergy status to sulfonamides status: Secondary | ICD-10-CM

## 2018-06-10 DIAGNOSIS — Z91018 Allergy to other foods: Secondary | ICD-10-CM

## 2018-06-10 DIAGNOSIS — Z7982 Long term (current) use of aspirin: Secondary | ICD-10-CM

## 2018-06-10 DIAGNOSIS — Z79891 Long term (current) use of opiate analgesic: Secondary | ICD-10-CM

## 2018-06-10 DIAGNOSIS — E43 Unspecified severe protein-calorie malnutrition: Secondary | ICD-10-CM | POA: Diagnosis present

## 2018-06-10 DIAGNOSIS — H3530 Unspecified macular degeneration: Secondary | ICD-10-CM | POA: Diagnosis present

## 2018-06-10 DIAGNOSIS — K449 Diaphragmatic hernia without obstruction or gangrene: Secondary | ICD-10-CM

## 2018-06-10 DIAGNOSIS — R011 Cardiac murmur, unspecified: Secondary | ICD-10-CM | POA: Diagnosis present

## 2018-06-10 DIAGNOSIS — E8770 Fluid overload, unspecified: Secondary | ICD-10-CM | POA: Diagnosis present

## 2018-06-10 DIAGNOSIS — Z803 Family history of malignant neoplasm of breast: Secondary | ICD-10-CM

## 2018-06-10 DIAGNOSIS — R778 Other specified abnormalities of plasma proteins: Secondary | ICD-10-CM | POA: Diagnosis present

## 2018-06-10 HISTORY — DX: Unspecified dementia, unspecified severity, without behavioral disturbance, psychotic disturbance, mood disturbance, and anxiety: F03.90

## 2018-06-10 LAB — COMPREHENSIVE METABOLIC PANEL
ALT: 17 U/L (ref 0–44)
AST: 27 U/L (ref 15–41)
Albumin: 3.5 g/dL (ref 3.5–5.0)
Alkaline Phosphatase: 52 U/L (ref 38–126)
Anion gap: 6 (ref 5–15)
BUN: 28 mg/dL — ABNORMAL HIGH (ref 8–23)
CO2: 27 mmol/L (ref 22–32)
Calcium: 9.2 mg/dL (ref 8.9–10.3)
Chloride: 103 mmol/L (ref 98–111)
Creatinine, Ser: 0.9 mg/dL (ref 0.44–1.00)
GFR, EST NON AFRICAN AMERICAN: 55 mL/min — AB (ref >60)
Glucose, Bld: 152 mg/dL — ABNORMAL HIGH (ref 70–99)
Potassium: 4.8 mmol/L (ref 3.5–5.1)
Sodium: 136 mmol/L (ref 135–145)
Total Bilirubin: 1.3 mg/dL — ABNORMAL HIGH (ref 0.3–1.2)
Total Protein: 5.9 g/dL — ABNORMAL LOW (ref 6.5–8.1)

## 2018-06-10 LAB — URINALYSIS, ROUTINE W REFLEX MICROSCOPIC
Bilirubin Urine: NEGATIVE
Glucose, UA: NEGATIVE mg/dL
Ketones, ur: NEGATIVE mg/dL
Nitrite: NEGATIVE
Protein, ur: NEGATIVE mg/dL
Specific Gravity, Urine: 1.008 (ref 1.005–1.030)
pH: 6 (ref 5.0–8.0)

## 2018-06-10 LAB — CBC
HCT: 35.3 % — ABNORMAL LOW (ref 36.0–46.0)
Hemoglobin: 11.1 g/dL — ABNORMAL LOW (ref 12.0–15.0)
MCH: 29.9 pg (ref 26.0–34.0)
MCHC: 31.4 g/dL (ref 30.0–36.0)
MCV: 95.1 fL (ref 80.0–100.0)
NRBC: 0 % (ref 0.0–0.2)
Platelets: 141 10*3/uL — ABNORMAL LOW (ref 150–400)
RBC: 3.71 MIL/uL — ABNORMAL LOW (ref 3.87–5.11)
RDW: 12.8 % (ref 11.5–15.5)
WBC: 14.4 10*3/uL — ABNORMAL HIGH (ref 4.0–10.5)

## 2018-06-10 LAB — LIPASE, BLOOD: Lipase: 35 U/L (ref 11–51)

## 2018-06-10 LAB — GLUCOSE, CAPILLARY: Glucose-Capillary: 179 mg/dL — ABNORMAL HIGH (ref 70–99)

## 2018-06-10 LAB — TROPONIN I: Troponin I: 1.07 ng/mL (ref <0.03)

## 2018-06-10 MED ORDER — TRAMADOL HCL 50 MG PO TABS
50.0000 mg | ORAL_TABLET | Freq: Three times a day (TID) | ORAL | Status: DC | PRN
  Administered 2018-06-10 – 2018-06-12 (×2): 50 mg via ORAL
  Filled 2018-06-10 (×2): qty 1

## 2018-06-10 MED ORDER — SODIUM CHLORIDE 0.9% FLUSH
3.0000 mL | Freq: Two times a day (BID) | INTRAVENOUS | Status: DC
  Administered 2018-06-10 – 2018-06-13 (×6): 3 mL via INTRAVENOUS

## 2018-06-10 MED ORDER — SODIUM CHLORIDE 0.9 % IV SOLN: 250.0000 mL | INTRAVENOUS | Status: DC | PRN

## 2018-06-10 MED ORDER — ACETAMINOPHEN 325 MG PO TABS
650.0000 mg | ORAL_TABLET | ORAL | Status: DC | PRN
  Administered 2018-06-12: 650 mg via ORAL
  Filled 2018-06-10: qty 2

## 2018-06-10 MED ORDER — FUROSEMIDE 10 MG/ML IJ SOLN
20.0000 mg | Freq: Once | INTRAMUSCULAR | Status: AC
  Administered 2018-06-10: 20 mg via INTRAVENOUS
  Filled 2018-06-10: qty 2

## 2018-06-10 MED ORDER — ONDANSETRON HCL 4 MG/2ML IJ SOLN: 4.0000 mg | Freq: Four times a day (QID) | INTRAMUSCULAR | Status: DC | PRN

## 2018-06-10 MED ORDER — ASPIRIN EC 81 MG PO TBEC
81.0000 mg | DELAYED_RELEASE_TABLET | Freq: Every day | ORAL | Status: DC
  Administered 2018-06-11 – 2018-06-13 (×3): 81 mg via ORAL
  Filled 2018-06-10 (×3): qty 1

## 2018-06-10 MED ORDER — SUCRALFATE 1 G PO TABS: 1.0000 g | ORAL_TABLET | Freq: Three times a day (TID) | ORAL | 0 refills | Status: DC

## 2018-06-10 MED ORDER — GLYCERIN (LAXATIVE) 2 G RE SUPP: 1.0000 | Freq: Once | RECTAL | Status: DC | PRN

## 2018-06-10 MED ORDER — NITROGLYCERIN 0.4 MG SL SUBL: 0.4000 mg | SUBLINGUAL_TABLET | SUBLINGUAL | Status: DC | PRN

## 2018-06-10 MED ORDER — SODIUM CHLORIDE 0.9% FLUSH
3.0000 mL | Freq: Once | INTRAVENOUS | Status: AC
  Administered 2018-06-10: 3 mL via INTRAVENOUS

## 2018-06-10 MED ORDER — ONDANSETRON HCL 4 MG/2ML IJ SOLN
4.0000 mg | Freq: Once | INTRAMUSCULAR | Status: AC
  Administered 2018-06-10: 4 mg via INTRAVENOUS
  Filled 2018-06-10: qty 2

## 2018-06-10 MED ORDER — CARVEDILOL 6.25 MG PO TABS
6.2500 mg | ORAL_TABLET | Freq: Two times a day (BID) | ORAL | Status: DC
  Administered 2018-06-10 – 2018-06-13 (×6): 6.25 mg via ORAL
  Filled 2018-06-10 (×6): qty 1

## 2018-06-10 MED ORDER — ONDANSETRON HCL 4 MG PO TABS: 4.0000 mg | ORAL_TABLET | Freq: Three times a day (TID) | ORAL | 0 refills | Status: AC | PRN

## 2018-06-10 MED ORDER — ADULT MULTIVITAMIN W/MINERALS CH
1.0000 | ORAL_TABLET | Freq: Every day | ORAL | Status: DC
  Administered 2018-06-10 – 2018-06-13 (×4): 1 via ORAL
  Filled 2018-06-10 (×4): qty 1

## 2018-06-10 MED ORDER — SODIUM CHLORIDE 0.9 % IV BOLUS
1000.0000 mL | Freq: Once | INTRAVENOUS | Status: AC
  Administered 2018-06-10: 1000 mL via INTRAVENOUS

## 2018-06-10 MED ORDER — ONE-DAILY MULTI VITAMINS PO TABS: 1.0000 | ORAL_TABLET | Freq: Every day | ORAL | Status: DC

## 2018-06-10 MED ORDER — SODIUM CHLORIDE 0.9% FLUSH: 3.0000 mL | INTRAVENOUS | Status: DC | PRN

## 2018-06-10 MED ORDER — ORAL CARE MOUTH RINSE
15.0000 mL | Freq: Two times a day (BID) | OROMUCOSAL | Status: DC
  Administered 2018-06-11 – 2018-06-12 (×3): 15 mL via OROMUCOSAL

## 2018-06-10 MED ORDER — FUROSEMIDE 20 MG PO TABS: 20.0000 mg | ORAL_TABLET | ORAL | Status: DC | PRN

## 2018-06-10 MED ORDER — PANTOPRAZOLE SODIUM 40 MG PO TBEC
80.0000 mg | DELAYED_RELEASE_TABLET | Freq: Every day | ORAL | Status: DC
  Administered 2018-06-11 – 2018-06-13 (×3): 80 mg via ORAL
  Filled 2018-06-10 (×3): qty 2

## 2018-06-10 MED ORDER — VITAMIN B-12 1000 MCG PO TABS
500.0000 ug | ORAL_TABLET | ORAL | Status: DC
  Administered 2018-06-11: 500 ug via ORAL
  Filled 2018-06-10: qty 1

## 2018-06-10 MED ORDER — IRBESARTAN 300 MG PO TABS
150.0000 mg | ORAL_TABLET | Freq: Every day | ORAL | Status: DC
  Administered 2018-06-11 – 2018-06-13 (×3): 150 mg via ORAL
  Filled 2018-06-10 (×3): qty 1

## 2018-06-10 MED ORDER — BOOST / RESOURCE BREEZE PO LIQD CUSTOM
1.0000 | Freq: Three times a day (TID) | ORAL | Status: DC
  Administered 2018-06-10 – 2018-06-13 (×8): 1 via ORAL
  Filled 2018-06-10 (×5): qty 1

## 2018-06-10 MED ORDER — HEPARIN SODIUM (PORCINE) 5000 UNIT/ML IJ SOLN
5000.0000 [IU] | Freq: Three times a day (TID) | INTRAMUSCULAR | Status: DC
  Administered 2018-06-10 – 2018-06-12 (×5): 5000 [IU] via SUBCUTANEOUS
  Filled 2018-06-10 (×6): qty 1

## 2018-06-10 NOTE — Progress Notes (Signed)
Patient admitted to room 5c07 from ED s/p epigastric pain. Admission education given to family and patient. Patient alert oriented but slow to answer some questions. Complains of epigastric pain 6/10, denies nausea. Generalized pain given, tramadol per prn orders. Placed on falls precautions and bed alarm on.

## 2018-06-10 NOTE — ED Provider Notes (Signed)
[facility] EMERGENCY DEPARTMENT Provider Note   CSN: [id number] Arrival date & time: 06/10/18  1054    History   Chief Complaint Chief Complaint  Patient presents with   Abdominal Pain    HPI Mary Cline is a 85 y.o. female.  She is brought in by her daughter for evaluation of upper abdominal pain that radiates around through to her back is been going on for few weeks.  It makes her feel nauseous and she has a decreased appetite.  She is vomited a few times.  She said her bowel movements been normal.  She has some urinary frequency.  No fevers or chills.  She denies any chest pain or shortness of breath.  She has a known history of a large hiatal hernia.     The history is provided by the patient and a relative.  Abdominal Pain  Pain location:  Epigastric Pain quality: aching   Pain radiates to:  Back Pain severity:  Moderate Onset quality:  Gradual Timing:  Intermittent Progression:  Waxing and waning Chronicity:  New Context: not recent travel, not sick contacts, not suspicious food intake and not trauma   Worsened by:  Nothing Ineffective treatments:  None tried Associated symptoms: anorexia, nausea and vomiting   Associated symptoms: no chest pain, no chills, no constipation, no cough, no diarrhea, no dysuria, no fever, no hematemesis, no hematochezia, no hematuria, no shortness of breath and no sore throat   Risk factors: being elderly     Past Medical History:  Diagnosis Date   Anemia    Arthritis    Chronic systolic heart failure ([facility]) 04/03/2015   Diabetes mellitus    Essential hypertension 04/03/2015   GERD (gastroesophageal reflux disease)    Hernia    Hyperlipidemia 04/03/2015   Hypertension    Macular degeneration disease     Patient Active Problem List   Diagnosis Date Noted   Dupuytren's contracture of both hands 17/61/6073   Chronic systolic heart failure ([place]) 04/03/2015   Essential hypertension 04/03/2015    Hyperlipidemia 04/03/2015   Retinal vascular occlusion, unspecified 11/10/2012   Giant cell arteritis ([place]) 11/10/2012   Headache(784.0) 11/08/2012   Visual changes 11/08/2012    Past Surgical History:  Procedure Laterality Date   ANGIOPLASTY     APPENDECTOMY     HYSTEROTOMY       OB History   No obstetric history on file.      Home Medications    Prior to Admission medications   Medication Sig Start Date End Date Taking? Authorizing Provider  aspirin 81 MG tablet Take 81 mg by mouth daily.    [provider]  B Complex Vitamins (VITAMIN B COMPLEX PO) Take 1 tablet by mouth. 3 times a week    [provider]  carvedilol (COREG) 6.25 MG tablet Take 1 tablet (6.25 mg total) by mouth 2 (two) times daily. 01/11/18   Skeet Latch, MD  Cyanocobalamin (VITAMIN B 12 PO) Take by mouth every other day.    [provider]  ezetimibe (ZETIA) 10 MG tablet Take 10 mg by mouth daily.    [provider]  furosemide (LASIX) 20 MG tablet Take 1 tablet (20 mg total) by mouth as needed. For weight gain more that 2 pounds in a day or 5 pounds in a week. 10/09/16   Skeet Latch, MD  glycerin adult 2 G SUPP Place 1 suppository rectally once as needed for moderate constipation.    [provider]  ibandronate (BONIVA) 150 MG tablet Take 150 mg by mouth every 30 (thirty) days. Take in the morning with a full glass of water, on an empty stomach, and do not take anything else by mouth or lie down for the next 30 min.    [provider]  irbesartan (AVAPRO) 150 MG tablet Take 1 tablet (150 mg total) daily by mouth. 02/16/17   Skeet Latch, MD  Magnesium Hydroxide (EQ MILK OF MAGNESIA PO) Take by mouth as needed (PRN for no bowel movement x 2 days).    [provider]  Multiple Vitamin (MULTIVITAMIN) tablet Take 1 tablet by mouth daily.    [provider]  NONFORMULARY OR COMPOUNDED ITEM Ketoconazole and fluticasone  compounded cream.  Apply as needed to affected areas as needed as directed.    [provider]  omeprazole (PRILOSEC) 40 MG capsule Take 40 mg by mouth daily.    [provider]  traMADol (ULTRAM) 50 MG tablet Take 1 tablet (50 mg total) by mouth 3 (three) times daily as needed for severe pain. 09/04/14   Carmin Muskrat, MD    Family History Family History  Problem Relation Age of Onset   Cancer Sister        Breast cancer   Diabetes Brother    Diabetes Maternal Grandmother    Heart Problems Maternal Grandfather    Heart Problems Paternal Grandmother    Heart Problems Paternal Grandfather    Diabetes Brother    Diabetes Brother    Heart Problems Daughter    Heart Problems Daughter     Social History Social History   Tobacco Use   Smoking status: Never Smoker   Smokeless tobacco: Never Used  Substance Use Topics   Alcohol use: No   Drug use: No     Allergies   Fish allergy; Iodine; Actonel [risedronate sodium]; Atorvastatin; Ciprofloxacin; Codeine; Corticotropin; Demeclocycline; Dihydrotachysterol; Garamycin [gentamicin sulfate]; Lactose intolerance (gi); Lactulose; Lovastatin; Macrobid [nitrofurantoin monohyd macro]; Meperidine; Other; Paxil [paroxetine hcl]; Penicillins; Rofecoxib; Sulfa antibiotics; Tetracyclines & related; and Vitamin d analogs   Review of Systems Review of Systems  Constitutional: Positive for appetite change. Negative for chills and fever.  HENT: Negative for sore throat.   Eyes: Negative for visual disturbance.  Respiratory: Negative for cough and shortness of breath.   Cardiovascular: Negative for chest pain.  Gastrointestinal: Positive for abdominal pain, anorexia, nausea and vomiting. Negative for constipation, diarrhea, hematemesis and hematochezia.  Genitourinary: Negative for dysuria and hematuria.  Musculoskeletal: Positive for back pain.  Skin: Negative for rash.  Neurological: Negative for headaches.      Physical Exam Updated Vital Signs BP 136/80 (BP Location: Left Arm)    Pulse 91    Temp (!) 97.4 F (36.3 C) (Oral)    Resp 18    SpO2 96%   Physical Exam Vitals signs and nursing note reviewed.  Constitutional:      General: She is not in acute distress.    Appearance: She is well-developed and underweight.  HENT:     Head: Normocephalic and atraumatic.  Eyes:     Conjunctiva/sclera: Conjunctivae normal.  Neck:     Musculoskeletal: Neck supple.  Cardiovascular:     Rate and Rhythm: Normal rate and regular rhythm.     Heart sounds: No murmur.  Pulmonary:     Effort: Pulmonary effort is normal. No respiratory distress.     Breath sounds: Normal breath sounds.  Abdominal:     General: Abdomen  is flat.     Palpations: Abdomen is soft.     Tenderness: There is abdominal tenderness in the epigastric area. There is no guarding or rebound.  Musculoskeletal: Normal range of motion.     Right lower leg: No edema.     Left lower leg: No edema.  Skin:    General: Skin is warm and dry.     Capillary Refill: Capillary refill takes less than 2 seconds.  Neurological:     General: No focal deficit present.     Mental Status: She is alert. Mental status is at baseline.     Sensory: No sensory deficit.     Motor: No weakness.     Gait: Gait normal.  Psychiatric:        Behavior: Behavior is cooperative.      ED Treatments / Results  Labs (all labs ordered are listed, but only abnormal results are displayed) Labs Reviewed  COMPREHENSIVE METABOLIC PANEL - Abnormal; Notable for the following components:      Result Value   Glucose, Bld 152 (*)    BUN 28 (*)    Total Protein 5.9 (*)    Total Bilirubin 1.3 (*)    GFR calc non Af Amer 55 (*)    All other components within normal limits  CBC - Abnormal; Notable for the following components:   WBC 14.4 (*)    RBC 3.71 (*)    Hemoglobin 11.1 (*)    HCT 35.3 (*)    Platelets 141 (*)    All other components within normal  limits  URINALYSIS, ROUTINE W REFLEX MICROSCOPIC - Abnormal; Notable for the following components:   Hgb urine dipstick SMALL (*)    Leukocytes,Ua TRACE (*)    Bacteria, UA RARE (*)    All other components within normal limits  TROPONIN I - Abnormal; Notable for the following components:   Troponin I 1.07 (*)    All other components within normal limits  BASIC METABOLIC PANEL - Abnormal; Notable for the following components:   Glucose, Bld 113 (*)    BUN 26 (*)    Calcium 8.8 (*)    GFR calc non Af Amer 50 (*)    GFR calc Af Amer 58 (*)    All other components within normal limits  CBC - Abnormal; Notable for the following components:   WBC 11.8 (*)    RBC 3.34 (*)    Hemoglobin 10.4 (*)    HCT 31.9 (*)    Platelets 125 (*)    All other components within normal limits  BRAIN NATRIURETIC PEPTIDE - Abnormal; Notable for the following components:   B Natriuretic Peptide 3,675.2 (*)    All other components within normal limits  GLUCOSE, CAPILLARY - Abnormal; Notable for the following components:   Glucose-Capillary 179 (*)    All other components within normal limits  LIPASE, BLOOD  LIPID PANEL  PROTIME-INR    EKG EKG Interpretation  Date/Time:  Thursday June 10 2018 13:21:28 EDT Ventricular Rate:  91 PR Interval:    QRS Duration: 164 QT Interval:  432 QTC Calculation: 532 R Axis:   75 Text Interpretation:  Sinus rhythm LVH with secondary repolarization abnormality Prolonged QT interval Baseline wander in lead(s) V2 V3 V4 V5 V6 compared with prior 6/16 Confirmed by Aletta Edouard [phone]) on 06/10/2018 1:35:47 PM   Radiology Ct Abdomen Pelvis Wo Contrast  Result Date: 06/10/2018 CLINICAL DATA:  Acute generalized abdominal pain. EXAM: CT ABDOMEN AND PELVIS  WITHOUT CONTRAST TECHNIQUE: Multidetector CT imaging of the abdomen and pelvis was performed following the standard protocol without IV contrast. COMPARISON:  CT scan of October 31, 2013. FINDINGS: Lower chest: Elevated  left hemidiaphragm is noted. Small right pleural effusion is noted with adjacent subsegmental atelectasis. Hepatobiliary: No focal liver abnormality is seen. Status post cholecystectomy. No biliary dilatation. Pancreas: Unremarkable. No pancreatic ductal dilatation or surrounding inflammatory changes. Spleen: Normal in size without focal abnormality. Adrenals/Urinary Tract: Adrenal glands are unremarkable. Kidneys are normal, without renal calculi, focal lesion, or hydronephrosis. Bladder is unremarkable. Stomach/Bowel: The stomach is unremarkable. There is no evidence of bowel obstruction or inflammation. Status post appendectomy. Vascular/Lymphatic: Aortic atherosclerosis. No enlarged abdominal or pelvic lymph nodes. Reproductive: Status post hysterectomy. No adnexal masses. Other: No abdominal wall hernia or abnormality. No abdominopelvic ascites. Musculoskeletal: No acute or significant osseous findings. IMPRESSION: Small right pleural effusion with adjacent subsegmental atelectasis. Stable elevated left hemidiaphragm. No acute abnormality seen in the abdomen or pelvis. Aortic Atherosclerosis (ICD10-I70.0). Electronically Signed   By: Marijo Conception, M.D.   On: 06/10/2018 15:21   Dg Chest 2 View  Result Date: 06/10/2018 CLINICAL DATA:  Upper abdominal pain with nausea vomiting for 1 week. EXAM: CHEST - 2 VIEW COMPARISON:  10/16/2015 FINDINGS: Cardiomediastinal silhouette is enlarged. Mediastinal contours appear intact. Calcific atherosclerotic disease of the aorta. Chronic elevation of the left hemidiaphragm. Bilateral small pleural effusions and lower lobe atelectasis. Mild interstitial edema. Osteoarthritic changes of the left shoulder. Soft tissues are grossly normal. IMPRESSION: 1. Enlarged cardiac silhouette. 2. Mild interstitial edema. 3. Bilateral small pleural effusions and lower lobe atelectasis. Electronically Signed   By: Fidela Salisbury M.D.   On: 06/10/2018 14:12     Procedures Procedures (including critical care time)  Medications Ordered in ED Medications  sodium chloride flush (NS) 0.9 % injection 3 mL (has no administration in time range)  sodium chloride 0.9 % bolus 1,000 mL (1,000 mLs Intravenous New Bag/Given 06/10/18 1433)  ondansetron (ZOFRAN) injection 4 mg (4 mg Intravenous Given 06/10/18 1424)     Initial Impression / Assessment and Plan / ED Course  I have reviewed the triage vital signs and the nursing notes.  Pertinent labs & imaging results that were available during my care of the patient were reviewed by me and considered in my medical decision making (see chart for details).  Clinical Course as of Jun 09 1620  Thu Jun 10, 2018  1542 Patient's lab work imaging have been fairly unremarkable.  I do not have an explanation for her abdominal pain.  She has a known hiatal hernia and her GI doctor has not wanted to scope her as there has not been any benefit to it more recently, and there would be increased risk.  They are comfortable going home and I will prescribe them Zofran and some Carafate to try.  They understand to follow-up with her doctor and return if any worsening symptoms.   [MB]  2703 Is received an emergency call back from the lab that the patient's troponin is positive.  We are able to get her back from being discharged and placed back in her room.  She is not having any chest pain.  Will review with cardiology and likely medicine admit.   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   [MB]  1612 Page hospitalist for admission.  Do not feel the patient should be   [MB]    Clinical Course  User Index [MB] Hayden Rasmussen, MD        Final Clinical Impressions(s) / ED Diagnoses   Final diagnoses:  Epigastric pain  NSTEMI (non-ST elevated myocardial infarction) [facility])    ED Discharge Orders    None       Hayden Rasmussen, MD 06/11/18 1534

## 2018-06-10 NOTE — Consult Note (Addendum)
Cardiology Consultation:   Patient ID: Mary Cline MRN: [id number]; DOB: 03-14-1923  Admit date: 06/10/2018 Date of Consult: 06/10/2018  Primary Care Provider: [name], MD Primary Cardiologist: Mary Latch, MD  Primary Electrophysiologist:  None    Patient Profile:   Mary Cline is a 85 y.o. female with a hx of CAD, macular degeneration, giant cell arteritis, CLL, hypertension, hyperlipidemia, hernia, GERD, diabetes type 2, chronic systolic heart failure, LBBB, arthritis and anemia who is being seen today for the evaluation of elevated troponin at the request of Mary Cline.  History of Present Illness:   Mary Cline has a history of NSTEMI and systolic heart failure in 11/2014.  Her troponin was mildly elevated at 0.17.  BNP was 33,000.  Echo found EF to be 15-20% with grade 3 diastolic dysfunction.  The troponin level may have been related to demand ischemia in the setting of heart failure.  At the time her family decided not to pursue aggressive measures given her age.  The patient has been unable to get financial assistance to afford Entresto in the past.  She is treated with irbesartan and carvedilol.  The patient reports that she had 2 heart attacks about 30 years ago in [place].  Details are unknown.  The patient was last seen in the office on 03/09/2018 by Dr. Oval Linsey at which time she was noted to have had no recurrences of chest pain.  She was euvolemic and blood pressure was well controlled.  The patient has a 3-day history of abdominal pain that is a constant ache between her umbilicus and lower rib margin.  The pain goes around to her right side.  It is worse with movement.  She is also had nausea and early satiety.  She denies any chest pain/pressure/tightness.  She had some mild shortness of breath this morning but none now.  She chronically sleeps with her head elevated using a mechanical bed.  She has had no worsening of chronic orthopnea.  She says  she could not sleep last night but that was due to abdominal pain.  She watches a strict low-salt diet.  She has no edema.  The patient walks with a walker at home and lives with her 2 daughters.  She does not get out of the house much.  She does her ADLs with some assistance of her daughters.  They fix her food and she is able to heated up in the microwave for herself.  The patient denies any exertional chest discomfort or shortness of breath with the little activity that she does.  Past Medical History:  Diagnosis Date   Anemia    Arthritis    Chronic systolic heart failure ([place]) 04/03/2015   Diabetes mellitus    Essential hypertension 04/03/2015   GERD (gastroesophageal reflux disease)    Hernia    Hyperlipidemia 04/03/2015   Hypertension    Macular degeneration disease     Past Surgical History:  Procedure Laterality Date   ANGIOPLASTY     APPENDECTOMY     HYSTEROTOMY       Home Medications:  Prior to Admission medications   Medication Sig Start Date End Date Taking? Authorizing Provider  aspirin 81 MG tablet Take 81 mg by mouth daily.   Yes [provider]  carvedilol (COREG) 6.25 MG tablet Take 1 tablet (6.25 mg total) by mouth 2 (two) times daily. 01/11/18  Yes Mary Latch, MD  Cyanocobalamin (VITAMIN B 12) 500 MCG TABS Take 500 mcg by  mouth every Monday, Wednesday, and Friday.    Yes [provider]  ezetimibe (ZETIA) 10 MG tablet Take 10 mg by mouth daily.   Yes [provider]  furosemide (LASIX) 20 MG tablet Take 1 tablet (20 mg total) by mouth as needed. For weight gain more that 2 pounds in a day or 5 pounds in a week. 10/09/16  Yes Mary Latch, MD  glycerin adult 2 G SUPP Place 1 suppository rectally once as needed for moderate constipation.   Yes [provider]  ibandronate (BONIVA) 150 MG tablet Take 150 mg by mouth every 30 (thirty) days. Take in the morning with a full glass of water, on an empty stomach, and  do not take anything else by mouth or lie down for the next 30 min.   Yes [provider]  irbesartan (AVAPRO) 150 MG tablet Take 1 tablet (150 mg total) daily by mouth. 02/16/17  Yes Mary Latch, MD  Multiple Vitamin (MULTIVITAMIN) tablet Take 1 tablet by mouth daily.   Yes [provider]  NONFORMULARY OR COMPOUNDED ITEM Apply 1 application topically as needed (apply to bottom area). Ketoconazole and fluticasone compounded cream.  Apply as needed to affected areas as needed as direct   Yes [provider]  omeprazole (PRILOSEC) 40 MG capsule Take 40 mg by mouth daily.   Yes [provider]  traMADol (ULTRAM) 50 MG tablet Take 1 tablet (50 mg total) by mouth 3 (three) times daily as needed for severe pain. 09/04/14  Yes Carmin Muskrat, MD  ondansetron (ZOFRAN) 4 MG tablet Take 1 tablet (4 mg total) by mouth every 8 (eight) hours as needed for nausea or vomiting. 06/10/18   Hayden Rasmussen, MD  sucralfate (CARAFATE) 1 g tablet Take 1 tablet (1 g total) by mouth 4 (four) times daily -  with meals and at bedtime. 06/10/18   Hayden Rasmussen, MD    Inpatient Medications: Scheduled Meds:  sodium chloride flush  3 mL Intravenous Once   Continuous Infusions:  PRN Meds:   Allergies:    Allergies  Allergen Reactions   Fish Allergy Anaphylaxis   Iodine Anaphylaxis   Actonel [Risedronate Sodium] Nausea And Vomiting   Atorvastatin Other (See Comments)    Blisters in mouth.   Blisters in mouth.     Ciprofloxacin Nausea And Vomiting   Codeine Other (See Comments)    Headache  Headache   Corticotropin Other (See Comments)    Confusion  Confusion   Demeclocycline     Stomach pain   Dihydrotachysterol     Dizzy confused   Garamycin [Gentamicin Sulfate] Rash   Lactose Intolerance (Gi) Other (See Comments)    Intolerance.     Lactulose     Intolerance.     Lovastatin Other (See Comments)    blisters blisters   Macrobid  [Nitrofurantoin Monohyd Macro] Nausea And Vomiting   Meperidine Other (See Comments)    Hypotension  Hypotension   Other Hives and Swelling    Melons and strawberries.     Paxil [Paroxetine Hcl] Nausea Only   Penicillins Other (See Comments)    Did it involve swelling of the face/tongue/throat, SOB, or low BP? No Did it involve sudden or severe rash/hives, skin peeling, or any reaction on the inside of your mouth or nose? Unknown Did you need to seek medical attention at a hospital or doctor's office? Unknown When did it last happen?Childhood If all above answers are NO, may proceed with cephalosporin  use. Pain.     Rofecoxib Other (See Comments)    Not known Not known   Sulfa Antibiotics Swelling   Tetracyclines & Related Other (See Comments)    Stomach pain   Vitamin D Analogs Other (See Comments)    Dizzy confused    Social History:   Social History   Socioeconomic History   Marital status: Widowed    Spouse name: Not on file   Number of children: 4   Years of education: RN    Highest education level: Not on file  Occupational History   Not on file  Social Needs   Financial resource strain: Not on file   Food insecurity:    Worry: Not on file    Inability: Not on file   Transportation needs:    Medical: Not on file    Non-medical: Not on file  Tobacco Use   Smoking status: Never Smoker   Smokeless tobacco: Never Used  Substance and Sexual Activity   Alcohol use: No   Drug use: No   Sexual activity: Not on file  Lifestyle   Physical activity:    Days per week: Not on file    Minutes per session: Not on file   Stress: Not on file  Relationships   Social connections:    Talks on phone: Not on file    Gets together: Not on file    Attends religious service: Not on file    Active member of club or organization: Not on file    Attends meetings of clubs or organizations: Not on file    Relationship status: Not on file    Intimate partner violence:    Fear of current or ex partner: Not on file    Emotionally abused: Not on file    Physically abused: Not on file    Forced sexual activity: Not on file  Other Topics Concern   Not on file  Social History Narrative   Patient lives at home with her daughter.    Patient is retired.    Patient is a Education administrator.    Patient was an RN    Patient has 4 children.             Epworth Sleepiness Scale = 10 (as of 04/03/2015)    Family History:    Family History  Problem Relation Age of Onset   Cancer Sister        Breast cancer   Diabetes Brother    Diabetes Maternal Grandmother    Heart Problems Maternal Grandfather    Heart Problems Paternal Grandmother    Heart Problems Paternal Grandfather    Diabetes Brother    Diabetes Brother    Heart Problems Daughter    Heart Problems Daughter      ROS:  Please see the history of present illness.   All other ROS reviewed and negative.     Physical Exam/Data:   Vitals:   06/10/18 1057 06/10/18 1545 06/10/18 1549  BP: 136/80 128/71 128/74  Pulse: 91 92 94  Resp: 18 (!) 24 (!) 24  Temp: (!) 97.4 F (36.3 C)    TempSrc: Oral    SpO2: 96% 96% 94%   No intake or output data in the 24 hours ending 06/10/18 1626 Last 3 Weights 04/12/2018 03/09/2018 12/15/2017  Weight (lbs) 91 lb 4.8 oz 92 lb 6.4 oz 93 lb 12.8 oz  Weight (kg) 41.413 kg 41.912 kg 42.547 kg     There is  no height or weight on file to calculate BMI.  General: Thin, frail, elderly female, in no acute distress HEENT: normal Lymph: no adenopathy Neck: Possible JVD, but due to patient's thin body habitus, this may be normal. Endocrine:  No thryomegaly Vascular: No carotid bruits; FA pulses 2+ bilaterally  Cardiac:  normal S1, S2; RRR; no murmur  Lungs:  clear to auscultation bilaterally except few scattered wheezes and diminished breath sounds in the bases Abd: soft, very tender to palpation in the epigastric region, tenderness to right  upper quadrant to the side Ext: no edema Musculoskeletal:  No deformities, BUE and BLE strength normal and equal Skin: warm and dry  Neuro:  CNs 2-12 intact, no focal abnormalities noted Psych:  Normal affect   EKG:  The EKG was personally reviewed and demonstrates:  Sinus rhythm, 91 bpm, LBBB, LVH with secondary repolarization abnormality, Prolonged QT interval QTC 532 Telemetry:  Telemetry was personally reviewed and demonstrates: Sinus rhythm in the 90s  Relevant CV Studies:  Echocardiogram done at [facility] in [facility] 12/18/2014 -Cardiomyopathy with reduced systolic function and regional wall motion abnormalities -Estimated LV ejection fraction is 66-29% -Grade 3 diastolic dysfunction -Left atrium moderately to severely dilated -Right atrium mildly dilated -Mild aortic valve sclerosis without significant stenosis -Moderate thickening of the mitral valve anterior and posterior leaflets with moderate mitral regurgitation -Mild tricuspid regurgitation -Localized anterior pericardial effusion -No intracardiac mass or thrombus identified   Laboratory Data:  Chemistry Recent Labs  Lab 06/10/18 1152  NA 136  K 4.8  CL 103  CO2 27  GLUCOSE 152*  BUN 28*  CREATININE 0.90  CALCIUM 9.2  GFRNONAA 55*  GFRAA >60  ANIONGAP 6    Recent Labs  Lab 06/10/18 1152  PROT 5.9*  ALBUMIN 3.5  AST 27  ALT 17  ALKPHOS 52  BILITOT 1.3*   Hematology Recent Labs  Lab 06/10/18 1152  WBC 14.4*  RBC 3.71*  HGB 11.1*  HCT 35.3*  MCV 95.1  MCH 29.9  MCHC 31.4  RDW 12.8  PLT 141*   Cardiac Enzymes Recent Labs  Lab 06/10/18 1430  TROPONINI 1.07*   No results for input(s): TROPIPOC in the last 168 hours.  BNPNo results for input(s): BNP, PROBNP in the last 168 hours.  DDimer No results for input(s): DDIMER in the last 168 hours.  Radiology/Studies:  Ct Abdomen Pelvis Wo Contrast  Result Date: 06/10/2018 CLINICAL DATA:  Acute generalized  abdominal pain. EXAM: CT ABDOMEN AND PELVIS WITHOUT CONTRAST TECHNIQUE: Multidetector CT imaging of the abdomen and pelvis was performed following the standard protocol without IV contrast. COMPARISON:  CT scan of October 31, 2013. FINDINGS: Lower chest: Elevated left hemidiaphragm is noted. Small right pleural effusion is noted with adjacent subsegmental atelectasis. Hepatobiliary: No focal liver abnormality is seen. Status post cholecystectomy. No biliary dilatation. Pancreas: Unremarkable. No pancreatic ductal dilatation or surrounding inflammatory changes. Spleen: Normal in size without focal abnormality. Adrenals/Urinary Tract: Adrenal glands are unremarkable. Kidneys are normal, without renal calculi, focal lesion, or hydronephrosis. Bladder is unremarkable. Stomach/Bowel: The stomach is unremarkable. There is no evidence of bowel obstruction or inflammation. Status post appendectomy. Vascular/Lymphatic: Aortic atherosclerosis. No enlarged abdominal or pelvic lymph nodes. Reproductive: Status post hysterectomy. No adnexal masses. Other: No abdominal wall hernia or abnormality. No abdominopelvic ascites. Musculoskeletal: No acute or significant osseous findings. IMPRESSION: Small right pleural effusion with adjacent subsegmental atelectasis. Stable elevated left hemidiaphragm. No acute abnormality seen in the abdomen or pelvis. Aortic  Atherosclerosis (ICD10-I70.0). Electronically Signed   By: Marijo Conception, M.D.   On: 06/10/2018 15:21   Dg Chest 2 View  Result Date: 06/10/2018 CLINICAL DATA:  Upper abdominal pain with nausea vomiting for 1 week. EXAM: CHEST - 2 VIEW COMPARISON:  10/16/2015 FINDINGS: Cardiomediastinal silhouette is enlarged. Mediastinal contours appear intact. Calcific atherosclerotic disease of the aorta. Chronic elevation of the left hemidiaphragm. Bilateral small pleural effusions and lower lobe atelectasis. Mild interstitial edema. Osteoarthritic changes of the left shoulder. Soft  tissues are grossly normal. IMPRESSION: 1. Enlarged cardiac silhouette. 2. Mild interstitial edema. 3. Bilateral small pleural effusions and lower lobe atelectasis. Electronically Signed   By: Fidela Salisbury M.D.   On: 06/10/2018 14:12    Assessment and Plan:   1. Elevated troponin -Patient presented with a 3-day history of abdominal pain.  This is a constant ache between her umbilicus and lower ribs that radiates around to the right side and is associated with nausea and early satiety.  Her pain is worse with movement. Not consistent with myocardial ischemia. -Troponin found to be elevated at 1.07 -The patient denies any chest discomfort or significant shortness of breath. -Troponin elevation is related to demand ischemia in the setting of abdominal pain.  No need to check further troponins.  2.  Acute on chronic systolic heart failure -EF 15-20% by echo in 11/2014, done in [facility] during an admission for CHF with BNP of 33,000.  Echo also showed wall motion abnormalities. -Medical management includes carvedilol, ARB and Lasix as needed. -Chest x-ray shows mild interstitial edema and bilateral small pleural effusions and lower lobe atelectasis.  -The patient has no edema.  She has chronic orthopnea which is not worse lately.  She has few crackles in her lungs. -She may have a degree of heart failure going on.  We will check a BNP and give a small dose of Lasix.  3.  CAD -Patient was diagnosed with NSTEMI in 11/2014.  Troponin was 0.17.  Per Dr. Blenda Mounts note troponin level may have been related to demand ischemia in the setting of heart failure.  -Patient declined invasive measures due to her advanced age.  Therefore no cardiac catheterization was done. -The patient is treated with aspirin and beta-blocker and Zetia.  4.  Hypertension -Medical management includes irbesartan 150 mg daily, carvedilol 6.25 mg twice daily -BP well controlled  5.  Hyperlipidemia -On  Zetia 10 mg daily.  LDL was 71 on 08/11/2017.  Adequately controlled.      For questions or updates, please contact [place] Please consult [email or web address] for contact info under     Signed, Daune Perch, NP  06/10/2018 4:26 PM Patient seen and examined and history reviewed. Agree with above findings and plan. Pleasant 85 yo WF presents to the ED today for evaluation of epigastric and abdominal pain. She has a history of CHF with EF 20%. Doesn't take lasix regularly. On CHF therapy with Entresto and Coreg. Notes abdominal pain x 2 days. Also notes some SOB. No change in weight. No edema. Denies any chest pain.  On exam she has JVD. Lungs reveal bibasilar rales.  CV RRR without gallop or rub. Soft systolic murmur at the apex. No edema.   CXR shows mild pulmonary edema Troponin 1.  Ecg shows chronic LBBB NSR  Impression: Acute on chronic systolic CHF. She appears to be volume overloaded with rales, JVD and edema on CXR. Mildly symptomatic. Mild troponin elevation is due to CHF. No angina.  I suspect Gi symptoms are related to her large hiatal hernia.   Recommend gentle diuresis. Give lasix 20 mg IV now. Continue other CHF therapy.  Check BNP level I would not cycle troponins. She does not have ACS and more troponins will not be useful. clinically Echo already ordered.   Duwayne Matters [place], [place] 06/10/2018 5:40 PM

## 2018-06-10 NOTE — ED Notes (Signed)
ED TO INPATIENT HANDOFF REPORT  ED Nurse Name and Phone #:  Vertell Novak  S Name/Age/Gender Mary Cline 85 y.o. female Room/Bed: 026C/026C  Code Status   Code Status: Full Code  Home/SNF/Other Home Patient oriented to: self, place, time and situation Is this baseline? Yes   Triage Complete: Triage complete  Chief Complaint abd pain  Triage Note Patient reports abdominal pain, N/V, decreased appetite, since last week - states she saw her PCP Tuesday and per patient, when he pressed on her stomach, she about came out of the chair. Patient endorses history of hiatal hernia, GERD.    Allergies Allergies  Allergen Reactions  . Fish Allergy Anaphylaxis  . Iodine Anaphylaxis  . Actonel [Risedronate Sodium] Nausea And Vomiting  . Atorvastatin Other (See Comments)    Blisters in mouth.   Blisters in mouth.    . Ciprofloxacin Nausea And Vomiting  . Codeine Other (See Comments)    Headache  Headache  . Corticotropin Other (See Comments)    Confusion  Confusion  . Demeclocycline     Stomach pain  . Dihydrotachysterol     Dizzy confused  . Garamycin [Gentamicin Sulfate] Rash  . Lactose Intolerance (Gi) Other (See Comments)    Intolerance.    . Lactulose     Intolerance.    . Lovastatin Other (See Comments)    blisters blisters  . Macrobid [Nitrofurantoin Monohyd Macro] Nausea And Vomiting  . Meperidine Other (See Comments)    Hypotension  Hypotension  . Other Hives and Swelling    Melons and strawberries.    . Paxil [Paroxetine Hcl] Nausea Only  . Penicillins Other (See Comments)    Did it involve swelling of the face/tongue/throat, SOB, or low BP? No Did it involve sudden or severe rash/hives, skin peeling, or any reaction on the inside of your mouth or nose? Unknown Did you need to seek medical attention at a hospital or doctor's office? Unknown When did it last happen?Childhood If all above answers are "NO", may proceed with cephalosporin  use. Pain.    . Rofecoxib Other (See Comments)    Not known Not known  . Sulfa Antibiotics Swelling  . Tetracyclines & Related Other (See Comments)    Stomach pain  . Vitamin D Analogs Other (See Comments)    Dizzy confused    Level of Care/Admitting Diagnosis ED Disposition    ED Disposition Condition Comment   Admit  Hospital Area: [place] [[id number]]  Level of Care: Telemetry Cardiac [103]  I expect the patient will be discharged within 24 hours: Yes  LOW acuity---Tx typically complete <24 hrs---ACUTE conditions typically can be evaluated <24 hours---LABS likely to return to acceptable levels <24 hours---IS near functional baseline---EXPECTED to return to current living arrangement---NOT newly hypoxic: Meets criteria for 5C-Observation unit  Diagnosis: Acute coronary syndrome [facility] [[id number]]  Admitting Physician: Lady Deutscher [[id number]]  Attending Physician: Lady Deutscher [[id number]]  PT Class (Do Not Modify): Observation [104]  PT Acc Code (Do Not Modify): Observation [10022]       B Medical/Surgery History Past Medical History:  Diagnosis Date  . Anemia   . Arthritis   . Chronic systolic heart failure ([place]) 04/03/2015  . Diabetes mellitus   . Essential hypertension 04/03/2015  . GERD (gastroesophageal reflux disease)   . Hernia   . Hyperlipidemia 04/03/2015  . Hypertension   . Macular degeneration disease    Past Surgical History:  Procedure Laterality Date  . ANGIOPLASTY    .  APPENDECTOMY    . HYSTEROTOMY       A IV Location/Drains/Wounds Patient Lines/Drains/Airways Status   Active Line/Drains/Airways    Name:   Placement date:   Placement time:   Site:   Days:   Peripheral IV 06/10/18 Right Forearm   06/10/18    1614    Forearm   less than 1          Intake/Output Last 24 hours No intake or output data in the 24 hours ending 06/10/18 1702  Labs/Imaging Results for orders placed or performed during the hospital encounter of  06/10/18 (from the past 48 hour(s))  Lipase, blood     Status: None   Collection Time: 06/10/18 11:52 AM  Result Value Ref Range   Lipase 35 11 - 51 U/L    Comment: Performed at [facility], [address], [place], [place] [address]  Comprehensive metabolic panel     Status: Abnormal   Collection Time: 06/10/18 11:52 AM  Result Value Ref Range   Sodium 136 135 - 145 mmol/L   Potassium 4.8 3.5 - 5.1 mmol/L   Chloride 103 98 - 111 mmol/L   CO2 27 22 - 32 mmol/L   Glucose, Bld 152 (H) 70 - 99 mg/dL   BUN 28 (H) 8 - 23 mg/dL   Creatinine, Ser 0.90 0.44 - 1.00 mg/dL   Calcium 9.2 8.9 - 10.3 mg/dL   Total Protein 5.9 (L) 6.5 - 8.1 g/dL   Albumin 3.5 3.5 - 5.0 g/dL   AST 27 15 - 41 U/L   ALT 17 0 - 44 U/L   Alkaline Phosphatase 52 38 - 126 U/L   Total Bilirubin 1.3 (H) 0.3 - 1.2 mg/dL   GFR calc non Af Amer 55 (L) >60 mL/min   GFR calc Af Amer >60 >60 mL/min   Anion gap 6 5 - 15    Comment: Performed at [facility] [address], [place], [place] [address]  CBC     Status: Abnormal   Collection Time: 06/10/18 11:52 AM  Result Value Ref Range   WBC 14.4 (H) 4.0 - 10.5 K/uL   RBC 3.71 (L) 3.87 - 5.11 MIL/uL   Hemoglobin 11.1 (L) 12.0 - 15.0 g/dL   HCT 35.3 (L) 36.0 - 46.0 %   MCV 95.1 80.0 - 100.0 fL   MCH 29.9 26.0 - 34.0 pg   MCHC 31.4 30.0 - 36.0 g/dL   RDW 12.8 11.5 - 15.5 %   Platelets 141 (L) 150 - 400 K/uL   nRBC 0.0 0.0 - 0.2 %    Comment: Performed at [facility], [place] [address], [place], [place] [address]  Troponin I - Once     Status: Abnormal   Collection Time: 06/10/18  2:30 PM  Result Value Ref Range   Troponin I 1.07 (HH) <0.03 ng/mL    Comment: CRITICAL RESULT CALLED TO, READ BACK BY AND VERIFIED WITH: E ADKINSON,RN 1610 06/10/2018 WBOND Performed at [facility], [place] [address] [place], [place] [address]   Urinalysis, Routine w reflex microscopic     Status: Abnormal   Collection Time: 06/10/18  2:47 PM  Result Value Ref Range    Color, Urine YELLOW YELLOW   APPearance CLEAR CLEAR   Specific Gravity, Urine 1.008 1.005 - 1.030   pH 6.0 5.0 - 8.0   Glucose, UA NEGATIVE NEGATIVE mg/dL   Hgb urine dipstick SMALL (A) NEGATIVE   Bilirubin  Urine NEGATIVE NEGATIVE   Ketones, ur NEGATIVE NEGATIVE mg/dL   Protein, ur NEGATIVE NEGATIVE mg/dL   Nitrite NEGATIVE NEGATIVE   Leukocytes,Ua TRACE (A) NEGATIVE   RBC / HPF 0-5 0 - 5 RBC/hpf   WBC, UA 6-10 0 - 5 WBC/hpf   Bacteria, UA RARE (A) NONE SEEN   Squamous Epithelial / LPF 0-5 0 - 5    Comment: Performed at [facility], [place] [address] [place], [place] [address]   Ct Abdomen Pelvis Wo Contrast  Result Date: 06/10/2018 CLINICAL DATA:  Acute generalized abdominal pain. EXAM: CT ABDOMEN AND PELVIS WITHOUT CONTRAST TECHNIQUE: Multidetector CT imaging of the abdomen and pelvis was performed following the standard protocol without IV contrast. COMPARISON:  CT scan of October 31, 2013. FINDINGS: Lower chest: Elevated left hemidiaphragm is noted. Small right pleural effusion is noted with adjacent subsegmental atelectasis. Hepatobiliary: No focal liver abnormality is seen. Status post cholecystectomy. No biliary dilatation. Pancreas: Unremarkable. No pancreatic ductal dilatation or surrounding inflammatory changes. Spleen: Normal in size without focal abnormality. Adrenals/Urinary Tract: Adrenal glands are unremarkable. Kidneys are normal, without renal calculi, focal lesion, or hydronephrosis. Bladder is unremarkable. Stomach/Bowel: The stomach is unremarkable. There is no evidence of bowel obstruction or inflammation. Status post appendectomy. Vascular/Lymphatic: Aortic atherosclerosis. No enlarged abdominal or pelvic lymph nodes. Reproductive: Status post hysterectomy. No adnexal masses. Other: No abdominal wall hernia or abnormality. No abdominopelvic ascites. Musculoskeletal: No acute or significant osseous findings. IMPRESSION: Small right pleural effusion with adjacent  subsegmental atelectasis. Stable elevated left hemidiaphragm. No acute abnormality seen in the abdomen or pelvis. Aortic Atherosclerosis (ICD10-I70.0). Electronically Signed   By: Marijo Conception, M.D.   On: 06/10/2018 15:21   Dg Chest 2 View  Result Date: 06/10/2018 CLINICAL DATA:  Upper abdominal pain with nausea vomiting for 1 week. EXAM: CHEST - 2 VIEW COMPARISON:  10/16/2015 FINDINGS: Cardiomediastinal silhouette is enlarged. Mediastinal contours appear intact. Calcific atherosclerotic disease of the aorta. Chronic elevation of the left hemidiaphragm. Bilateral small pleural effusions and lower lobe atelectasis. Mild interstitial edema. Osteoarthritic changes of the left shoulder. Soft tissues are grossly normal. IMPRESSION: 1. Enlarged cardiac silhouette. 2. Mild interstitial edema. 3. Bilateral small pleural effusions and lower lobe atelectasis. Electronically Signed   By: Fidela Salisbury M.D.   On: 06/10/2018 14:12    Pending Labs Unresulted Labs (From admission, onward)    Start     Ordered   06/11/18 6834  Basic metabolic panel  Tomorrow morning,   R     06/10/18 1652   06/11/18 0500  Lipid panel  Tomorrow morning,   R     06/10/18 1652   06/11/18 0500  CBC  Tomorrow morning,   R     06/10/18 1652   06/11/18 0500  Protime-INR  Tomorrow morning,   R     06/10/18 1652   06/10/18 1650  Troponin I - Now Then Q6H  Now then every 6 hours,   STAT     06/10/18 1652   06/10/18 1648  CBC  (heparin)  Once,   R    Comments:  Baseline for heparin therapy IF NOT ALREADY DRAWN.  Notify MD if PLT < 100 K.    06/10/18 1652   06/10/18 1648  Creatinine, serum  (heparin)  Once,   R    Comments:  Baseline for heparin therapy IF NOT ALREADY DRAWN.    06/10/18 1652          Vitals/Pain Today's Vitals  06/10/18 1057 06/10/18 1148 06/10/18 1545 06/10/18 1549  BP: 136/80  128/71 128/74  Pulse: 91  92 94  Resp: 18  (!) 24 (!) 24  Temp: (!) 97.4 F (36.3 C)     TempSrc: Oral     SpO2:  96%  96% 94%  PainSc:  4       Isolation Precautions No active isolations  Medications Medications  sodium chloride flush (NS) 0.9 % injection 3 mL (has no administration in time range)  aspirin EC tablet 81 mg (has no administration in time range)  traMADol (ULTRAM) tablet 50 mg (has no administration in time range)  carvedilol (COREG) tablet 6.25 mg (has no administration in time range)  furosemide (LASIX) tablet 20 mg (has no administration in time range)  irbesartan (AVAPRO) tablet 150 mg (has no administration in time range)  pantoprazole (PROTONIX) EC tablet 80 mg (has no administration in time range)  vitamin B-12 (CYANOCOBALAMIN) tablet 500 mcg (has no administration in time range)  multivitamin tablet 1 tablet (has no administration in time range)  nitroGLYCERIN (NITROSTAT) SL tablet 0.4 mg (has no administration in time range)  acetaminophen (TYLENOL) tablet 650 mg (has no administration in time range)  ondansetron (ZOFRAN) injection 4 mg (has no administration in time range)  heparin injection 5,000 Units (has no administration in time range)  sodium chloride flush (NS) 0.9 % injection 3 mL (has no administration in time range)  sodium chloride flush (NS) 0.9 % injection 3 mL (has no administration in time range)  0.9 %  sodium chloride infusion (has no administration in time range)  sodium chloride 0.9 % bolus 1,000 mL (1,000 mLs Intravenous New Bag/Given 06/10/18 1433)  ondansetron (ZOFRAN) injection 4 mg (4 mg Intravenous Given 06/10/18 1424)    Mobility walks with person assist Moderate fall risk   Focused Assessments Cardiac Assessment Handoff:    Lab Results  Component Value Date   CKTOTAL 41 09/13/2009   CKMB 1.6 09/13/2009   TROPONINI 1.07 (HH) 06/10/2018   No results found for: DDIMER Does the Patient currently have chest pain? No     R Recommendations: See Admitting Provider Note  Report given to:   Additional Notes:  Epigastric pain only

## 2018-06-10 NOTE — H&P (Signed)
History and Physical    Mary Cline TMH:[id number] DOB: 10-19-1922 DOA: 06/10/2018  PCP: [name], MD  Patient coming from: Home  I have personally briefly reviewed patient's old medical records in [place]  Chief Complaint: Intermittent abdominal pain for a few weeks  HPI: Mary Cline is a 85 y.o. female with medical history significant of anemia, chronic systolic heart failure on a low-salt diet, type 2 diabetes, essential hypertension, and severe gastroesophageal reflux disease with hiatal hernia with stomach up in the chest cavity.  She presents from home with complaints of abdominal pain that is been ongoing for several weeks.  Pain makes her feel nauseous and she has an associated decreased appetite.  She has vomited a few times.  Her bowel movements have been normal.  She has had some urinary frequency but no fevers or chills.  She has had no chest pain or shortness of breath.  She has a known history of a large hiatal hernia which has caused her discomfort in the past.  Earlier this week she was at her primary care doctor's office and when he palpated her abdomen she experienced severe pain.  States pain is associated sometimes with eating but other times not.  ED Course: CT scan of the abdomen and lab work reassuring except for elevated troponin.  Patient referred to me for further evaluation and management.  Review of Systems: As per HPI otherwise all other systems reviewed and  negative.   Past Medical History:  Diagnosis Date   Anemia    Arthritis    Chronic systolic heart failure ([place]) 04/03/2015   Diabetes mellitus    Essential hypertension 04/03/2015   GERD (gastroesophageal reflux disease)    Hernia    Hyperlipidemia 04/03/2015   Hypertension    Macular degeneration disease     Past Surgical History:  Procedure Laterality Date   ANGIOPLASTY     APPENDECTOMY     HYSTEROTOMY      Social History   Social History Narrative     Patient lives at home with her daughter.    Patient is retired.    Patient is a Education administrator.    Patient was an RN    Patient has 4 children.             Epworth Sleepiness Scale = 10 (as of 04/03/2015)     reports that she has never smoked. She has never used smokeless tobacco. She reports that she does not drink alcohol or use drugs.  Allergies  Allergen Reactions   Fish Allergy Anaphylaxis   Iodine Anaphylaxis   Actonel [Risedronate Sodium] Nausea And Vomiting   Atorvastatin Other (See Comments)    Blisters in mouth.   Blisters in mouth.     Ciprofloxacin Nausea And Vomiting   Codeine Other (See Comments)    Headache  Headache   Corticotropin Other (See Comments)    Confusion  Confusion   Demeclocycline     Stomach pain   Dihydrotachysterol     Dizzy confused   Garamycin [Gentamicin Sulfate] Rash   Lactose Intolerance (Gi) Other (See Comments)    Intolerance.     Lactulose     Intolerance.     Lovastatin Other (See Comments)    blisters blisters   Macrobid [Nitrofurantoin Monohyd Macro] Nausea And Vomiting   Meperidine Other (See Comments)    Hypotension  Hypotension   Other Hives and Swelling    Melons and strawberries.  Paxil [Paroxetine Hcl] Nausea Only   Penicillins Other (See Comments)    Did it involve swelling of the face/tongue/throat, SOB, or low BP? No Did it involve sudden or severe rash/hives, skin peeling, or any reaction on the inside of your mouth or nose? Unknown Did you need to seek medical attention at a hospital or doctor's office? Unknown When did it last happen?Childhood If all above answers are NO, may proceed with cephalosporin use. Pain.     Rofecoxib Other (See Comments)    Not known Not known   Sulfa Antibiotics Swelling   Tetracyclines & Related Other (See Comments)    Stomach pain   Vitamin D Analogs Other (See Comments)    Dizzy confused    Family History  Problem Relation Age of Onset    Cancer Sister        Breast cancer   Diabetes Brother    Diabetes Maternal Grandmother    Heart Problems Maternal Grandfather    Heart Problems Paternal Grandmother    Heart Problems Paternal Grandfather    Diabetes Brother    Diabetes Brother    Heart Problems Daughter    Heart Problems Daughter      Prior to Admission medications   Medication Sig Start Date End Date Taking? Authorizing Provider  aspirin 81 MG tablet Take 81 mg by mouth daily.   Yes [provider]  carvedilol (COREG) 6.25 MG tablet Take 1 tablet (6.25 mg total) by mouth 2 (two) times daily. 01/11/18  Yes Skeet Latch, MD  Cyanocobalamin (VITAMIN B 12) 500 MCG TABS Take 500 mcg by mouth every Monday, Wednesday, and Friday.    Yes [provider]  ezetimibe (ZETIA) 10 MG tablet Take 10 mg by mouth daily.   Yes [provider]  furosemide (LASIX) 20 MG tablet Take 1 tablet (20 mg total) by mouth as needed. For weight gain more that 2 pounds in a day or 5 pounds in a week. 10/09/16  Yes Skeet Latch, MD  glycerin adult 2 G SUPP Place 1 suppository rectally once as needed for moderate constipation.   Yes [provider]  ibandronate (BONIVA) 150 MG tablet Take 150 mg by mouth every 30 (thirty) days. Take in the morning with a full glass of water, on an empty stomach, and do not take anything else by mouth or lie down for the next 30 min.   Yes [provider]  irbesartan (AVAPRO) 150 MG tablet Take 1 tablet (150 mg total) daily by mouth. 02/16/17  Yes Skeet Latch, MD  Multiple Vitamin (MULTIVITAMIN) tablet Take 1 tablet by mouth daily.   Yes [provider]  NONFORMULARY OR COMPOUNDED ITEM Apply 1 application topically as needed (apply to bottom area). Ketoconazole and fluticasone compounded cream.  Apply as needed to affected areas as needed as direct   Yes [provider]  omeprazole (PRILOSEC) 40 MG capsule Take 40 mg by mouth daily.    Yes [provider]  traMADol (ULTRAM) 50 MG tablet Take 1 tablet (50 mg total) by mouth 3 (three) times daily as needed for severe pain. 09/04/14  Yes Carmin Muskrat, MD  ondansetron (ZOFRAN) 4 MG tablet Take 1 tablet (4 mg total) by mouth every 8 (eight) hours as needed for nausea or vomiting. 06/10/18   Hayden Rasmussen, MD  sucralfate (CARAFATE) 1 g tablet Take 1 tablet (1 g total) by mouth 4 (four) times daily -  with meals and at bedtime. 06/10/18   Melina Copa,  Rebeca Alert, MD    Physical Exam:  Constitutional: NAD, calm, comfortable Vitals:   06/10/18 1057 06/10/18 1545 06/10/18 1549  BP: 136/80 128/71 128/74  Pulse: 91 92 94  Resp: 18 (!) 24 (!) 24  Temp: (!) 97.4 F (36.3 C)    TempSrc: Oral    SpO2: 96% 96% 94%   Eyes: PERRL, lids and conjunctivae normal ENMT: Mucous membranes are moist. Posterior pharynx clear of any exudate or lesions.Normal dentition.  Neck: normal, supple, no masses, no thyromegaly Respiratory: clear to auscultation bilaterally, no wheezing, no crackles. Normal respiratory effort. No accessory muscle use.  Cardiovascular: Regular rate and rhythm, no murmurs / rubs / gallops. No extremity edema. 2+ pedal pulses. No carotid bruits.  Abdomen: Mild tenderness with deep palpation, no masses palpated. No hepatosplenomegaly. Bowel sounds positive.  Musculoskeletal: no clubbing / cyanosis. No joint deformity upper and lower extremities. Good ROM, no contractures. Normal muscle tone.  Skin: no rashes, lesions, ulcers. No induration Neurologic: CN 2-12 grossly intact. Sensation intact, DTR normal. Strength 5/5 in all 4.  Psychiatric: Normal judgment and insight. Alert and oriented x 3. Normal mood.    Labs on Admission: I have personally reviewed following labs and imaging studies  CBC: Recent Labs  Lab 06/10/18 1152  WBC 14.4*  HGB 11.1*  HCT 35.3*  MCV 95.1  PLT 161*   Basic Metabolic Panel: Recent Labs  Lab 06/10/18 1152  NA 136  K 4.8  CL  103  CO2 27  GLUCOSE 152*  BUN 28*  CREATININE 0.90  CALCIUM 9.2   Liver Function Tests: Recent Labs  Lab 06/10/18 1152  AST 27  ALT 17  ALKPHOS 52  BILITOT 1.3*  PROT 5.9*  ALBUMIN 3.5   Recent Labs  Lab 06/10/18 1152  LIPASE 35   Cardiac Enzymes: Recent Labs  Lab 06/10/18 1430  TROPONINI 1.07*   Urine analysis:    Component Value Date/Time   COLORURINE YELLOW 06/10/2018 1447   APPEARANCEUR CLEAR 06/10/2018 1447   LABSPEC 1.008 06/10/2018 1447   PHURINE 6.0 06/10/2018 1447   GLUCOSEU NEGATIVE 06/10/2018 1447   HGBUR SMALL (A) 06/10/2018 1447   BILIRUBINUR NEGATIVE 06/10/2018 1447   KETONESUR NEGATIVE 06/10/2018 1447   PROTEINUR NEGATIVE 06/10/2018 1447   UROBILINOGEN 0.2 09/16/2011 1940   NITRITE NEGATIVE 06/10/2018 1447   LEUKOCYTESUR TRACE (A) 06/10/2018 1447    Radiological Exams on Admission: Ct Abdomen Pelvis Wo Contrast  Result Date: 06/10/2018 CLINICAL DATA:  Acute generalized abdominal pain. EXAM: CT ABDOMEN AND PELVIS WITHOUT CONTRAST TECHNIQUE: Multidetector CT imaging of the abdomen and pelvis was performed following the standard protocol without IV contrast. COMPARISON:  CT scan of October 31, 2013. FINDINGS: Lower chest: Elevated left hemidiaphragm is noted. Small right pleural effusion is noted with adjacent subsegmental atelectasis. Hepatobiliary: No focal liver abnormality is seen. Status post cholecystectomy. No biliary dilatation. Pancreas: Unremarkable. No pancreatic ductal dilatation or surrounding inflammatory changes. Spleen: Normal in size without focal abnormality. Adrenals/Urinary Tract: Adrenal glands are unremarkable. Kidneys are normal, without renal calculi, focal lesion, or hydronephrosis. Bladder is unremarkable. Stomach/Bowel: The stomach is unremarkable. There is no evidence of bowel obstruction or inflammation. Status post appendectomy. Vascular/Lymphatic: Aortic atherosclerosis. No enlarged abdominal or pelvic lymph nodes.  Reproductive: Status post hysterectomy. No adnexal masses. Other: No abdominal wall hernia or abnormality. No abdominopelvic ascites. Musculoskeletal: No acute or significant osseous findings. IMPRESSION: Small right pleural effusion with adjacent subsegmental atelectasis. Stable elevated left hemidiaphragm. No acute abnormality seen in the  abdomen or pelvis. Aortic Atherosclerosis (ICD10-I70.0). Electronically Signed   By: Marijo Conception, M.D.   On: 06/10/2018 15:21   Dg Chest 2 View  Result Date: 06/10/2018 CLINICAL DATA:  Upper abdominal pain with nausea vomiting for 1 week. EXAM: CHEST - 2 VIEW COMPARISON:  10/16/2015 FINDINGS: Cardiomediastinal silhouette is enlarged. Mediastinal contours appear intact. Calcific atherosclerotic disease of the aorta. Chronic elevation of the left hemidiaphragm. Bilateral small pleural effusions and lower lobe atelectasis. Mild interstitial edema. Osteoarthritic changes of the left shoulder. Soft tissues are grossly normal. IMPRESSION: 1. Enlarged cardiac silhouette. 2. Mild interstitial edema. 3. Bilateral small pleural effusions and lower lobe atelectasis. Electronically Signed   By: Fidela Salisbury M.D.   On: 06/10/2018 14:12    EKG: Independently reviewed.  Sinus rhythm with LVH with repolarization abnormalities and left bundle branch block.  Assessment/Plan Principal Problem:   Elevated troponin I level Active Problems:   Recurrent abdominal pain   Hiatal hernia   Essential hypertension   Acute coronary syndrome ([place])    1.  Elevated troponin level consistent with acute coronary syndrome: Patient with no chest pains but does have abdominal pain in the setting of a hiatal hernia.  This is a very confusing picture.  We will cycle serial troponins and repeat EKG in a.m.  Cardiology has been consulted to see the patient.  There are no surgical options for her regarding her hiatal hernia.  Followed very closely by Dr. Paulita Fujita and manages her diet very  closely and limits many foods in order to prevent discomfort.  Await cardiology input.  Echocardiogram is pending.  N.p.o. after midnight  2.  Recurrent abdominal pain: Likely related to her hiatal hernia.  She may be developing some scarring or discomfort in her chest related to neck in the chest.  3.  Hiatal hernia: As above.  4.  Essential hypertension: Blood pressures currently well controlled continue home medication regimen.   DVT prophylaxis: Subcu heparin Code Status: Full code Family Communication: Spoke with patient's daughter who was present at the time of admission.  Patient retains capacity. Disposition Plan: Home with family Consults called: Cardiology Admission status: Observation   Lady Deutscher MD FACP [facility] Pager [phone]  How to contact the [facility] Attending or Consulting provider [place] or covering provider during after hours [place], for this patient?  1. Check the care team in [facility] and look for a) attending/consulting TRH provider listed and b) the [facility] team listed 2. Log into [email or web address] and use [place]'s universal password to access. If you do not have the password, please contact the hospital operator. 3. Locate the [facility] provider you are looking for under [facility] and page to a number that you can be directly reached. 4. If you still have difficulty reaching the provider, please page the [facility] (Director on Call) for the Hospitalists listed on amion for assistance.  If 7PM-7AM, please contact night-coverage [email or web address] Password [facility]  06/10/2018, 4:53 PM

## 2018-06-10 NOTE — ED Triage Notes (Signed)
Patient reports abdominal pain, N/V, decreased appetite, since last week - states she saw her PCP Tuesday and per patient, when he pressed on her stomach, she about came out of the chair. Patient endorses history of hiatal hernia, GERD.

## 2018-06-11 ENCOUNTER — Observation Stay (HOSPITAL_BASED_OUTPATIENT_CLINIC_OR_DEPARTMENT_OTHER): Payer: Medicare PPO

## 2018-06-11 DIAGNOSIS — Z833 Family history of diabetes mellitus: Secondary | ICD-10-CM | POA: Diagnosis not present

## 2018-06-11 DIAGNOSIS — I5023 Acute on chronic systolic (congestive) heart failure: Secondary | ICD-10-CM | POA: Diagnosis not present

## 2018-06-11 DIAGNOSIS — Z91018 Allergy to other foods: Secondary | ICD-10-CM | POA: Diagnosis not present

## 2018-06-11 DIAGNOSIS — E43 Unspecified severe protein-calorie malnutrition: Secondary | ICD-10-CM | POA: Diagnosis not present

## 2018-06-11 DIAGNOSIS — Z882 Allergy status to sulfonamides status: Secondary | ICD-10-CM | POA: Diagnosis not present

## 2018-06-11 DIAGNOSIS — I248 Other forms of acute ischemic heart disease: Secondary | ICD-10-CM | POA: Diagnosis not present

## 2018-06-11 DIAGNOSIS — J9811 Atelectasis: Secondary | ICD-10-CM | POA: Diagnosis not present

## 2018-06-11 DIAGNOSIS — E8770 Fluid overload, unspecified: Secondary | ICD-10-CM | POA: Diagnosis present

## 2018-06-11 DIAGNOSIS — R1013 Epigastric pain: Secondary | ICD-10-CM | POA: Diagnosis present

## 2018-06-11 DIAGNOSIS — K449 Diaphragmatic hernia without obstruction or gangrene: Secondary | ICD-10-CM | POA: Diagnosis not present

## 2018-06-11 DIAGNOSIS — I340 Nonrheumatic mitral (valve) insufficiency: Secondary | ICD-10-CM | POA: Diagnosis not present

## 2018-06-11 DIAGNOSIS — Z7982 Long term (current) use of aspirin: Secondary | ICD-10-CM | POA: Diagnosis not present

## 2018-06-11 DIAGNOSIS — Z681 Body mass index (BMI) 19 or less, adult: Secondary | ICD-10-CM | POA: Diagnosis not present

## 2018-06-11 DIAGNOSIS — K219 Gastro-esophageal reflux disease without esophagitis: Secondary | ICD-10-CM | POA: Diagnosis present

## 2018-06-11 DIAGNOSIS — H3530 Unspecified macular degeneration: Secondary | ICD-10-CM | POA: Diagnosis present

## 2018-06-11 DIAGNOSIS — R7989 Other specified abnormal findings of blood chemistry: Secondary | ICD-10-CM | POA: Diagnosis not present

## 2018-06-11 DIAGNOSIS — E119 Type 2 diabetes mellitus without complications: Secondary | ICD-10-CM | POA: Diagnosis present

## 2018-06-11 DIAGNOSIS — Z881 Allergy status to other antibiotic agents status: Secondary | ICD-10-CM | POA: Diagnosis not present

## 2018-06-11 DIAGNOSIS — E739 Lactose intolerance, unspecified: Secondary | ICD-10-CM | POA: Diagnosis present

## 2018-06-11 DIAGNOSIS — Z880 Allergy status to penicillin: Secondary | ICD-10-CM | POA: Diagnosis not present

## 2018-06-11 DIAGNOSIS — Z79891 Long term (current) use of opiate analgesic: Secondary | ICD-10-CM | POA: Diagnosis not present

## 2018-06-11 DIAGNOSIS — Z803 Family history of malignant neoplasm of breast: Secondary | ICD-10-CM | POA: Diagnosis not present

## 2018-06-11 DIAGNOSIS — D649 Anemia, unspecified: Secondary | ICD-10-CM | POA: Diagnosis present

## 2018-06-11 DIAGNOSIS — I447 Left bundle-branch block, unspecified: Secondary | ICD-10-CM | POA: Diagnosis not present

## 2018-06-11 DIAGNOSIS — I5043 Acute on chronic combined systolic (congestive) and diastolic (congestive) heart failure: Secondary | ICD-10-CM | POA: Diagnosis not present

## 2018-06-11 DIAGNOSIS — I110 Hypertensive heart disease with heart failure: Secondary | ICD-10-CM | POA: Diagnosis not present

## 2018-06-11 DIAGNOSIS — I10 Essential (primary) hypertension: Secondary | ICD-10-CM | POA: Diagnosis not present

## 2018-06-11 DIAGNOSIS — E785 Hyperlipidemia, unspecified: Secondary | ICD-10-CM | POA: Diagnosis not present

## 2018-06-11 DIAGNOSIS — R109 Unspecified abdominal pain: Secondary | ICD-10-CM | POA: Diagnosis not present

## 2018-06-11 DIAGNOSIS — Z888 Allergy status to other drugs, medicaments and biological substances status: Secondary | ICD-10-CM | POA: Diagnosis not present

## 2018-06-11 DIAGNOSIS — Z91013 Allergy to seafood: Secondary | ICD-10-CM | POA: Diagnosis not present

## 2018-06-11 LAB — BASIC METABOLIC PANEL
Anion gap: 8 (ref 5–15)
BUN: 26 mg/dL — ABNORMAL HIGH (ref 8–23)
CO2: 26 mmol/L (ref 22–32)
Calcium: 8.8 mg/dL — ABNORMAL LOW (ref 8.9–10.3)
Chloride: 105 mmol/L (ref 98–111)
Creatinine, Ser: 0.97 mg/dL (ref 0.44–1.00)
GFR calc Af Amer: 58 mL/min — ABNORMAL LOW (ref >60)
GFR calc non Af Amer: 50 mL/min — ABNORMAL LOW (ref >60)
GLUCOSE: 113 mg/dL — AB (ref 70–99)
Potassium: 3.9 mmol/L (ref 3.5–5.1)
Sodium: 139 mmol/L (ref 135–145)

## 2018-06-11 LAB — ECHOCARDIOGRAM COMPLETE
Height: 61 in
Weight: 1559.09 [oz_av]

## 2018-06-11 LAB — BRAIN NATRIURETIC PEPTIDE: B Natriuretic Peptide: 3675.2 pg/mL — ABNORMAL HIGH (ref 0.0–100.0)

## 2018-06-11 LAB — LIPID PANEL
Cholesterol: 119 mg/dL (ref 0–200)
HDL: 41 mg/dL
LDL Cholesterol: 69 mg/dL (ref 0–99)
Total CHOL/HDL Ratio: 2.9 ratio
Triglycerides: 47 mg/dL
VLDL: 9 mg/dL (ref 0–40)

## 2018-06-11 LAB — CBC
HCT: 31.9 % — ABNORMAL LOW (ref 36.0–46.0)
Hemoglobin: 10.4 g/dL — ABNORMAL LOW (ref 12.0–15.0)
MCH: 31.1 pg (ref 26.0–34.0)
MCHC: 32.6 g/dL (ref 30.0–36.0)
MCV: 95.5 fL (ref 80.0–100.0)
Platelets: 125 10*3/uL — ABNORMAL LOW (ref 150–400)
RBC: 3.34 MIL/uL — ABNORMAL LOW (ref 3.87–5.11)
RDW: 12.8 % (ref 11.5–15.5)
WBC: 11.8 10*3/uL — ABNORMAL HIGH (ref 4.0–10.5)
nRBC: 0 % (ref 0.0–0.2)

## 2018-06-11 LAB — PROTIME-INR
INR: 1.2 (ref 0.8–1.2)
Prothrombin Time: 14.6 s (ref 11.4–15.2)

## 2018-06-11 MED ORDER — FUROSEMIDE 10 MG/ML IJ SOLN
20.0000 mg | Freq: Two times a day (BID) | INTRAMUSCULAR | Status: DC
  Administered 2018-06-11 – 2018-06-12 (×3): 20 mg via INTRAVENOUS
  Filled 2018-06-11 (×3): qty 2

## 2018-06-11 MED ORDER — POTASSIUM CHLORIDE CRYS ER 20 MEQ PO TBCR
20.0000 meq | EXTENDED_RELEASE_TABLET | Freq: Two times a day (BID) | ORAL | Status: DC
  Administered 2018-06-11 – 2018-06-12 (×3): 20 meq via ORAL
  Filled 2018-06-11 (×3): qty 1

## 2018-06-11 NOTE — Progress Notes (Signed)
Pt had positive troponin prior to admission. Proivder's H&P note read plan to cycle troponin. Provider Bodenheimer paged and made aware , patient had not had any repeat troponin.

## 2018-06-11 NOTE — Progress Notes (Addendum)
Progress Note    Mary Cline  YSA:[id number] DOB: Apr 05, 1922  DOA: 06/10/2018 PCP: [name], MD    Brief Narrative:    Medical records reviewed and are as summarized below:  Mary Cline is an 85 y.o. female with medical history significant of anemia, chronic systolic heart failure on a low-salt diet, type 2 diabetes, essential hypertension, and severe gastroesophageal reflux disease with hiatal hernia with stomach up in the chest cavity.  She presents from home with complaints of abdominal pain that is been ongoing for several weeks.    Assessment/Plan:   Principal Problem:   Recurrent abdominal pain Active Problems:   Essential hypertension   Elevated troponin I level   Hiatal hernia   Acute on chronic systolic and diastolic heart failure, NYHA class 2 (HCC)  Acute on chronic systolic HF and acute respiratory failure -IV lasix -Daily weights/strict I/Os - aldactone added by cardiology -EF improved from 2016 to 20-25% -BNP elevated  Elevated troponin -per cardiology not ACS -no need to cycle CE   Recurrent abdominal pain: - Likely related to her hiatal hernia +/- volume overload  Essential hypertension:  - continue home medication regimen.  Needs continued IV diureses (has O2 sat of 83% on RA) so will plan to change to inpatient status  Family Communication/Anticipated D/C date and plan/Code Status   DVT prophylaxis: heparin Code Status: Full Code.  Family Communication:  Disposition Plan: home after IV diuresis   Medical Consultants:    cards     Subjective:   No more stomach pain  Objective:    Vitals:   06/10/18 2116 06/11/18 0440 06/11/18 0500 06/11/18 0641  BP: 119/77 120/76    Pulse: 96 87    Resp: 19 18    Temp: 98.1 F (36.7 C) 98.1 F (36.7 C)    TempSrc: Oral Oral    SpO2: 94% 94%    Weight:   44.2 kg   Height:    5\' 1"  (1.549 m)   No intake or output data in the 24 hours ending 06/11/18 0932 Filed  Weights   06/11/18 0500  Weight: 44.2 kg    Exam: In bed, NAD rrr Diminished breath sounds No LE edema Alert and cooperative  Data Reviewed:   I have personally reviewed following labs and imaging studies:  Labs: Labs show the following:   Basic Metabolic Panel: Recent Labs  Lab 06/10/18 1152 06/11/18 0539  NA 136 139  K 4.8 3.9  CL 103 105  CO2 27 26  GLUCOSE 152* 113*  BUN 28* 26*  CREATININE 0.90 0.97  CALCIUM 9.2 8.8*   GFR Estimated Creatinine Clearance: 25.3 mL/min (by C-G formula based on SCr of 0.97 mg/dL). Liver Function Tests: Recent Labs  Lab 06/10/18 1152  AST 27  ALT 17  ALKPHOS 52  BILITOT 1.3*  PROT 5.9*  ALBUMIN 3.5   Recent Labs  Lab 06/10/18 1152  LIPASE 35   No results for input(s): AMMONIA in the last 168 hours. Coagulation profile Recent Labs  Lab 06/11/18 0539  INR 1.2    CBC: Recent Labs  Lab 06/10/18 1152 06/11/18 0539  WBC 14.4* 11.8*  HGB 11.1* 10.4*  HCT 35.3* 31.9*  MCV 95.1 95.5  PLT 141* 125*   Cardiac Enzymes: Recent Labs  Lab 06/10/18 1430  TROPONINI 1.07*   BNP (last 3 results) No results for input(s): PROBNP in the last 8760 hours. CBG: Recent Labs  Lab 06/10/18 2121  GLUCAP  179*   D-Dimer: No results for input(s): DDIMER in the last 72 hours. Hgb A1c: No results for input(s): HGBA1C in the last 72 hours. Lipid Profile: Recent Labs    06/11/18 0539  CHOL 119  HDL 41  LDLCALC 69  TRIG 47  CHOLHDL 2.9   Thyroid function studies: No results for input(s): TSH, T4TOTAL, T3FREE, THYROIDAB in the last 72 hours.  Invalid input(s): FREET3 Anemia work up: No results for input(s): VITAMINB12, FOLATE, FERRITIN, TIBC, IRON, RETICCTPCT in the last 72 hours. Sepsis Labs: Recent Labs  Lab 06/10/18 1152 06/11/18 0539  WBC 14.4* 11.8*    Microbiology No results found for this or any previous visit (from the past 240 hour(s)).  Procedures and diagnostic studies:  Ct Abdomen Pelvis Wo  Contrast  Result Date: 06/10/2018 CLINICAL DATA:  Acute generalized abdominal pain. EXAM: CT ABDOMEN AND PELVIS WITHOUT CONTRAST TECHNIQUE: Multidetector CT imaging of the abdomen and pelvis was performed following the standard protocol without IV contrast. COMPARISON:  CT scan of October 31, 2013. FINDINGS: Lower chest: Elevated left hemidiaphragm is noted. Small right pleural effusion is noted with adjacent subsegmental atelectasis. Hepatobiliary: No focal liver abnormality is seen. Status post cholecystectomy. No biliary dilatation. Pancreas: Unremarkable. No pancreatic ductal dilatation or surrounding inflammatory changes. Spleen: Normal in size without focal abnormality. Adrenals/Urinary Tract: Adrenal glands are unremarkable. Kidneys are normal, without renal calculi, focal lesion, or hydronephrosis. Bladder is unremarkable. Stomach/Bowel: The stomach is unremarkable. There is no evidence of bowel obstruction or inflammation. Status post appendectomy. Vascular/Lymphatic: Aortic atherosclerosis. No enlarged abdominal or pelvic lymph nodes. Reproductive: Status post hysterectomy. No adnexal masses. Other: No abdominal wall hernia or abnormality. No abdominopelvic ascites. Musculoskeletal: No acute or significant osseous findings. IMPRESSION: Small right pleural effusion with adjacent subsegmental atelectasis. Stable elevated left hemidiaphragm. No acute abnormality seen in the abdomen or pelvis. Aortic Atherosclerosis (ICD10-I70.0). Electronically Signed   By: Marijo Conception, M.D.   On: 06/10/2018 15:21   Dg Chest 2 View  Result Date: 06/10/2018 CLINICAL DATA:  Upper abdominal pain with nausea vomiting for 1 week. EXAM: CHEST - 2 VIEW COMPARISON:  10/16/2015 FINDINGS: Cardiomediastinal silhouette is enlarged. Mediastinal contours appear intact. Calcific atherosclerotic disease of the aorta. Chronic elevation of the left hemidiaphragm. Bilateral small pleural effusions and lower lobe atelectasis. Mild  interstitial edema. Osteoarthritic changes of the left shoulder. Soft tissues are grossly normal. IMPRESSION: 1. Enlarged cardiac silhouette. 2. Mild interstitial edema. 3. Bilateral small pleural effusions and lower lobe atelectasis. Electronically Signed   By: Fidela Salisbury M.D.   On: 06/10/2018 14:12    Medications:    aspirin EC  81 mg Oral Daily   carvedilol  6.25 mg Oral BID   feeding supplement  1 Container Oral TID BM   furosemide  20 mg Intravenous Q12H   heparin  5,000 Units Subcutaneous Q8H   irbesartan  150 mg Oral Daily   mouth rinse  15 mL Mouth Rinse BID   multivitamin with minerals  1 tablet Oral Daily   pantoprazole  80 mg Oral Daily   potassium chloride  20 mEq Oral BID   sodium chloride flush  3 mL Intravenous Q12H   vitamin B-12  500 mcg Oral Q M,W,F   Continuous Infusions:  sodium chloride       LOS: 0 days   Geradine Girt  [facility]   How to contact the [facility] Attending or Consulting provider [place] or covering provider during after hours 7P -  7A, for this patient?  1. Check the care team in [facility] and look for a) attending/consulting TRH provider listed and b) the [facility] team listed 2. Log into [email or web address] and use [place]'s universal password to access. If you do not have the password, please contact the hospital operator. 3. Locate the [facility] provider you are looking for under [facility] and page to a number that you can be directly reached. 4. If you still have difficulty reaching the provider, please page the [facility] (Director on Call) for the Hospitalists listed on amion for assistance.  06/11/2018, 9:32 AM

## 2018-06-11 NOTE — Progress Notes (Signed)
  Echocardiogram 2D Echocardiogram has been performed.  Mary Cline 06/11/2018, 8:39 AM

## 2018-06-11 NOTE — Progress Notes (Signed)
Progress Note  Patient Name: Mary Cline Date of Encounter: 06/11/2018  Primary Cardiologist: Skeet Latch, MD   Subjective   Feels better today. Epigastric pain resolved. Slept soundly. Denies dyspnea at rest.  Inpatient Medications    Scheduled Meds:  aspirin EC  81 mg Oral Daily   carvedilol  6.25 mg Oral BID   feeding supplement  1 Container Oral TID BM   furosemide  20 mg Intravenous Q12H   heparin  5,000 Units Subcutaneous Q8H   irbesartan  150 mg Oral Daily   mouth rinse  15 mL Mouth Rinse BID   multivitamin with minerals  1 tablet Oral Daily   pantoprazole  80 mg Oral Daily   potassium chloride  20 mEq Oral BID   sodium chloride flush  3 mL Intravenous Q12H   vitamin B-12  500 mcg Oral Q M,W,F   Continuous Infusions:  sodium chloride     PRN Meds: sodium chloride, acetaminophen, furosemide, nitroGLYCERIN, ondansetron (ZOFRAN) IV, sodium chloride flush, traMADol   Vital Signs    Vitals:   06/10/18 2116 06/11/18 0440 06/11/18 0500 06/11/18 0641  BP: 119/77 120/76    Pulse: 96 87    Resp: 19 18    Temp: 98.1 F (36.7 C) 98.1 F (36.7 C)    TempSrc: Oral Oral    SpO2: 94% 94%    Weight:   44.2 kg   Height:    5\' 1"  (1.549 m)   No intake or output data in the 24 hours ending 06/11/18 0828 Last 3 Weights 06/11/2018 04/12/2018 03/09/2018  Weight (lbs) 97 lb 7.1 oz 91 lb 4.8 oz 92 lb 6.4 oz  Weight (kg) 44.2 kg 41.413 kg 41.912 kg      Telemetry    NSR with PVCs - Personally Reviewed  ECG    NSR with PVC. LBBB - Personally Reviewed  Physical Exam   GEN: elderly WF, No acute distress.   Neck:  JVD to 8 cm Cardiac: RRR, gr 2/6 systolic murmur at the apex Respiratory: bilateral rales GI: Soft, nontender, non-distended  MS: No edema; No deformity. Neuro:  Nonfocal  Psych: Normal affect   Labs    Chemistry Recent Labs  Lab 06/10/18 1152 06/11/18 0539  NA 136 139  K 4.8 3.9  CL 103 105  CO2 27 26  GLUCOSE 152* 113*   BUN 28* 26*  CREATININE 0.90 0.97  CALCIUM 9.2 8.8*  PROT 5.9*  --   ALBUMIN 3.5  --   AST 27  --   ALT 17  --   ALKPHOS 52  --   BILITOT 1.3*  --   GFRNONAA 55* 50*  GFRAA >60 58*  ANIONGAP 6 8     Hematology Recent Labs  Lab 06/10/18 1152 06/11/18 0539  WBC 14.4* 11.8*  RBC 3.71* 3.34*  HGB 11.1* 10.4*  HCT 35.3* 31.9*  MCV 95.1 95.5  MCH 29.9 31.1  MCHC 31.4 32.6  RDW 12.8 12.8  PLT 141* 125*    Cardiac Enzymes Recent Labs  Lab 06/10/18 1430  TROPONINI 1.07*   No results for input(s): TROPIPOC in the last 168 hours.   BNP Recent Labs  Lab 06/11/18 0539  BNP 3,675.2*     DDimer No results for input(s): DDIMER in the last 168 hours.   Radiology    Ct Abdomen Pelvis Wo Contrast  Result Date: 06/10/2018 CLINICAL DATA:  Acute generalized abdominal pain. EXAM: CT ABDOMEN AND PELVIS WITHOUT CONTRAST TECHNIQUE: Multidetector  CT imaging of the abdomen and pelvis was performed following the standard protocol without IV contrast. COMPARISON:  CT scan of October 31, 2013. FINDINGS: Lower chest: Elevated left hemidiaphragm is noted. Small right pleural effusion is noted with adjacent subsegmental atelectasis. Hepatobiliary: No focal liver abnormality is seen. Status post cholecystectomy. No biliary dilatation. Pancreas: Unremarkable. No pancreatic ductal dilatation or surrounding inflammatory changes. Spleen: Normal in size without focal abnormality. Adrenals/Urinary Tract: Adrenal glands are unremarkable. Kidneys are normal, without renal calculi, focal lesion, or hydronephrosis. Bladder is unremarkable. Stomach/Bowel: The stomach is unremarkable. There is no evidence of bowel obstruction or inflammation. Status post appendectomy. Vascular/Lymphatic: Aortic atherosclerosis. No enlarged abdominal or pelvic lymph nodes. Reproductive: Status post hysterectomy. No adnexal masses. Other: No abdominal wall hernia or abnormality. No abdominopelvic ascites. Musculoskeletal: No acute  or significant osseous findings. IMPRESSION: Small right pleural effusion with adjacent subsegmental atelectasis. Stable elevated left hemidiaphragm. No acute abnormality seen in the abdomen or pelvis. Aortic Atherosclerosis (ICD10-I70.0). Electronically Signed   By: Marijo Conception, M.D.   On: 06/10/2018 15:21   Dg Chest 2 View  Result Date: 06/10/2018 CLINICAL DATA:  Upper abdominal pain with nausea vomiting for 1 week. EXAM: CHEST - 2 VIEW COMPARISON:  10/16/2015 FINDINGS: Cardiomediastinal silhouette is enlarged. Mediastinal contours appear intact. Calcific atherosclerotic disease of the aorta. Chronic elevation of the left hemidiaphragm. Bilateral small pleural effusions and lower lobe atelectasis. Mild interstitial edema. Osteoarthritic changes of the left shoulder. Soft tissues are grossly normal. IMPRESSION: 1. Enlarged cardiac silhouette. 2. Mild interstitial edema. 3. Bilateral small pleural effusions and lower lobe atelectasis. Electronically Signed   By: Fidela Salisbury M.D.   On: 06/10/2018 14:12    Cardiac Studies   Echo pending. Being done at bedside. EF is severely reduced. Moderate to severe MR.   Patient Profile     85 y.o. female with a hx of CAD, macular degeneration, giant cell arteritis, CLL, hypertension, hyperlipidemia, hernia, GERD, diabetes type 2, chronic systolic heart failure, LBBB, arthritis and anemia who is being seen today for the evaluation of elevated troponin at the request of Dr. Melina Copa.  Assessment & Plan    1. Acute on chronic systolic CHF. Exam with persistent JVD and rales. BNP elevated 3675. CXR c/w pulmonary edema. She was given IV lasix 20 mg once. Reports increased volume of urine but no I/O measured. She is clinically still volume overloaded. Will continue lasix 20 mg IV bid. Replete potassium. Strict I/O and daily weights. Continue irbesartan and Coreg. Will add aldactone 12.5 mg daily.  2. Elevated troponin secondary to demand ischemia with CHF.  No anginal symptoms.  3. Epigastric/abdominal pain. Suspect related to large hiatal hernia but could be exacerbated by hepatic congestion/CHF.  4. CAD. Not a candidate for invasive evaluation. Will manage conservatively.  5. HTN controlled.  6. HLD on Zetia. LDL 69.       For questions or updates, please contact [place] Please consult [email or web address] for contact info under        Signed, Gurshaan Matsuoka [place], MD  06/11/2018, 8:28 AM

## 2018-06-11 NOTE — Care Management Obs Status (Signed)
[facility] NOTIFICATION   Patient Details  Name: Mary Cline MRN: [id number] Date of Birth: 1922/05/23   Medicare Observation Status Notification Given:  Yes    Midge Minium RN, BSN, NCM-BC, ACM-RN [phone] 06/11/2018, 3:52 PM

## 2018-06-12 DIAGNOSIS — E8770 Fluid overload, unspecified: Secondary | ICD-10-CM

## 2018-06-12 DIAGNOSIS — E43 Unspecified severe protein-calorie malnutrition: Secondary | ICD-10-CM

## 2018-06-12 DIAGNOSIS — I10 Essential (primary) hypertension: Secondary | ICD-10-CM

## 2018-06-12 LAB — BASIC METABOLIC PANEL
Anion gap: 11 (ref 5–15)
BUN: 31 mg/dL — ABNORMAL HIGH (ref 8–23)
CALCIUM: 8.7 mg/dL — AB (ref 8.9–10.3)
CO2: 27 mmol/L (ref 22–32)
Chloride: 101 mmol/L (ref 98–111)
Creatinine, Ser: 1.05 mg/dL — ABNORMAL HIGH (ref 0.44–1.00)
GFR calc Af Amer: 53 mL/min — ABNORMAL LOW (ref >60)
GFR, EST NON AFRICAN AMERICAN: 46 mL/min — AB (ref >60)
Glucose, Bld: 128 mg/dL — ABNORMAL HIGH (ref 70–99)
Potassium: 4 mmol/L (ref 3.5–5.1)
Sodium: 139 mmol/L (ref 135–145)

## 2018-06-12 MED ORDER — ENSURE ENLIVE PO LIQD
237.0000 mL | Freq: Three times a day (TID) | ORAL | Status: DC
  Administered 2018-06-12 – 2018-06-13 (×3): 237 mL via ORAL

## 2018-06-12 MED ORDER — GLYCERIN (LAXATIVE) 2.1 G RE SUPP
1.0000 | Freq: Every day | RECTAL | Status: DC | PRN
  Administered 2018-06-12: 1 via RECTAL
  Filled 2018-06-12 (×2): qty 1

## 2018-06-12 MED ORDER — POLYETHYLENE GLYCOL 3350 17 G PO PACK: 17.0000 g | PACK | Freq: Every day | ORAL | Status: DC

## 2018-06-12 MED ORDER — FUROSEMIDE 10 MG/ML IJ SOLN
20.0000 mg | Freq: Two times a day (BID) | INTRAMUSCULAR | Status: AC
  Administered 2018-06-12: 20 mg via INTRAVENOUS
  Filled 2018-06-12: qty 2

## 2018-06-12 MED ORDER — SUCRALFATE 1 GM/10ML PO SUSP
1.0000 g | Freq: Three times a day (TID) | ORAL | Status: DC
  Administered 2018-06-12 – 2018-06-13 (×4): 1 g via ORAL
  Filled 2018-06-12 (×4): qty 10

## 2018-06-12 MED ORDER — ENOXAPARIN SODIUM 40 MG/0.4ML ~~LOC~~ SOLN
40.0000 mg | SUBCUTANEOUS | Status: DC
  Administered 2018-06-13: 40 mg via SUBCUTANEOUS
  Filled 2018-06-12: qty 0.4

## 2018-06-12 MED ORDER — SPIRONOLACTONE 12.5 MG HALF TABLET
12.5000 mg | ORAL_TABLET | Freq: Every day | ORAL | Status: DC
  Administered 2018-06-12 – 2018-06-13 (×2): 12.5 mg via ORAL
  Filled 2018-06-12 (×2): qty 1

## 2018-06-12 NOTE — Progress Notes (Addendum)
Initial Nutrition Assessment  DOCUMENTATION CODES:   Severe malnutrition in context of chronic illness, Underweight  INTERVENTION:    Recommend liberalize diet to regular. Patient and her daughters can order foods that patient can tolerate.   Will try multiple supplements to see which one she tolerates best:  Ensure Enlive po TID, each supplement provides 350 kcal and 20 grams of protein  Magic cup TID with meals, each supplement provides 290 kcal and 9 grams of protein  Hormel Shake TID with meals, each supplement provides 480-500 kcals and 20-23 grams of protein  Continue Boost Breeze po TID, each supplement provides 250 kcal and 9 grams of protein  NUTRITION DIAGNOSIS:   Severe Malnutrition related to chronic illness(hiatal hernia, CHF) as evidenced by severe fat depletion, severe muscle depletion.  GOAL:   Patient will meet greater than or equal to 90% of their needs  MONITOR:   PO intake, Supplement acceptance, Skin  REASON FOR ASSESSMENT:   Malnutrition Screening Tool    ASSESSMENT:   85 yo female with PMH of HTN, GERD, hiatal hernia with stomach up in chest cavity, DM, macular degeneration, CHF, HLD, and dementia who was admitted with recurrent abdominal pain.   Abd pain is suspected to be related to her hiatal hernia and volume overload.    Spoke with patient and two of her daughters. She usually eats 6 small meals and snacks per day. She drinks an Equate shake that has 30 gm protein. She eats limited amounts of vegetables due to nausea/vomiting. She has even tried baby food vegetables, but doesn't tolerate many. Family adds extra butter to vegetables to increase calorie intake. Patient c/o no appetite, so it is difficult to get her to eat much. Patient said that she lost ~10 lbs right before this admission. She likes the [employer] supplement that she has been receiving. Will try multiple supplements to see which one she likes and tolerates the best.   Labs  reviewed.  Medications reviewed and include lasix, MVI, KCl, B-12.  Per review of weight encounters, patient has lost 4% of usual weight within the past 6 months, not significant for the time frame.   NUTRITION - FOCUSED PHYSICAL EXAM:    Most Recent Value  Orbital Region  Severe depletion  Upper Arm Region  Severe depletion  Thoracic and Lumbar Region  Severe depletion  Buccal Region  Moderate depletion  Temple Region  Severe depletion  Clavicle Bone Region  Severe depletion  Clavicle and Acromion Bone Region  Severe depletion  Scapular Bone Region  Severe depletion  Dorsal Hand  Severe depletion  Patellar Region  Severe depletion  Anterior Thigh Region  Severe depletion  Posterior Calf Region  Severe depletion  Edema (RD Assessment)  None  Hair  Reviewed  Eyes  Reviewed  Mouth  Reviewed  Skin  Reviewed  Nails  Reviewed       Diet Order:   Diet Order            Diet Heart Room service appropriate? Yes; Fluid consistency: Thin  Diet effective now              EDUCATION NEEDS:   No education needs have been identified at this time  Skin:  Skin Assessment: Reviewed RN Assessment(MASD to buttocks)  Last BM:  3/12  Height:   Ht Readings from Last 1 Encounters:  06/11/18 5\' 1"  (1.549 m)    Weight:   Wt Readings from Last 1 Encounters:  06/12/18 40.8 kg  Ideal Body Weight:  47.7 kg  BMI:  Body mass index is 17.01 kg/m.  Estimated Nutritional Needs:   Kcal:  1200-1400  Protein:  60-70 gm  Fluid:  1.4 L    Molli Barrows, RD, LDN, [place] Pager [phone] After Hours Pager [phone]

## 2018-06-12 NOTE — Progress Notes (Signed)
Progress Note  Patient Name: Mary Cline Date of Encounter: 06/12/2018  Primary Cardiologist: Skeet Latch, MD   Patient Profile     85 y.o. female with a hx of CAD,macular degeneration, giant cell arteritis, CLL, hypertension, hyperlipidemia, hernia, GERD, diabetes type 2, chronic systolic heart failure, LBBB,arthritis and anemiawho is being seen today for the evaluation of elevated troponinand found to have acute CHF 3/20 Echo  20-25% LAE mod  MR eccentric at least moderate   Subjective   Without chest pain or shortness of breath  Ambulating in the room   Inpatient Medications    Scheduled Meds:  aspirin EC  81 mg Oral Daily   carvedilol  6.25 mg Oral BID   feeding supplement  1 Container Oral TID BM   feeding supplement (ENSURE ENLIVE)  237 mL Oral TID BM   furosemide  20 mg Intravenous Q12H   heparin  5,000 Units Subcutaneous Q8H   irbesartan  150 mg Oral Daily   mouth rinse  15 mL Mouth Rinse BID   multivitamin with minerals  1 tablet Oral Daily   pantoprazole  80 mg Oral Daily   potassium chloride  20 mEq Oral BID   sodium chloride flush  3 mL Intravenous Q12H   vitamin B-12  500 mcg Oral Q M,W,F   Continuous Infusions:  sodium chloride     PRN Meds: sodium chloride, acetaminophen, furosemide, nitroGLYCERIN, ondansetron (ZOFRAN) IV, sodium chloride flush, traMADol   Vital Signs    Vitals:   06/12/18 0247 06/12/18 0508 06/12/18 0518 06/12/18 0727  BP:  115/71  122/69  Pulse:  82  82  Resp:  18  18  Temp:  98.3 F (36.8 C)  98.1 F (36.7 C)  TempSrc:  Oral  Oral  SpO2:  91% 94% 92%  Weight: 40.8 kg     Height:        Intake/Output Summary (Last 24 hours) at 06/12/2018 1108 Last data filed at 06/12/2018 0943 Gross per 24 hour  Intake 840 ml  Output 1500 ml  Net -660 ml   Last 3 Weights 06/12/2018 06/11/2018 06/11/2018  Weight (lbs) 90 lb 92 lb 9.5 oz 97 lb 7.1 oz  Weight (kg) 40.824 kg 42 kg 44.2 kg      Telemetry       ECG    Sinus with LBBB 162 msec - Personally Reviewed  Physical Exam  Well developed and nourished in no acute distress HENT normal Neck supple with JVP-6-7 Clear Regular rate and rhythm, no murmurs or gallops Abd-soft with active BS No Clubbing cyanosis edema Skin-warm and dry A & Oriented  Grossly normal sensory and motor function    Labs    Chemistry Recent Labs  Lab 06/10/18 1152 06/11/18 0539 06/12/18 0450  NA 136 139 139  K 4.8 3.9 4.0  CL 103 105 101  CO2 27 26 27   GLUCOSE 152* 113* 128*  BUN 28* 26* 31*  CREATININE 0.90 0.97 1.05*  CALCIUM 9.2 8.8* 8.7*  PROT 5.9*  --   --   ALBUMIN 3.5  --   --   AST 27  --   --   ALT 17  --   --   ALKPHOS 52  --   --   BILITOT 1.3*  --   --   GFRNONAA 55* 50* 46*  GFRAA >60 58* 53*  ANIONGAP 6 8 11      Hematology Recent Labs  Lab 06/10/18 1152 06/11/18 0539  WBC 14.4* 11.8*  RBC 3.71* 3.34*  HGB 11.1* 10.4*  HCT 35.3* 31.9*  MCV 95.1 95.5  MCH 29.9 31.1  MCHC 31.4 32.6  RDW 12.8 12.8  PLT 141* 125*    Cardiac Enzymes Recent Labs  Lab 06/10/18 1430  TROPONINI 1.07*   No results for input(s): TROPIPOC in the last 168 hours.   BNP Recent Labs  Lab 06/11/18 0539  BNP 3,675.2*     DDimer No results for input(s): DDIMER in the last 168 hours.   Radiology    Ct Abdomen Pelvis Wo Contrast  Result Date: 06/10/2018 CLINICAL DATA:  Acute generalized abdominal pain. EXAM: CT ABDOMEN AND PELVIS WITHOUT CONTRAST TECHNIQUE: Multidetector CT imaging of the abdomen and pelvis was performed following the standard protocol without IV contrast. COMPARISON:  CT scan of October 31, 2013. FINDINGS: Lower chest: Elevated left hemidiaphragm is noted. Small right pleural effusion is noted with adjacent subsegmental atelectasis. Hepatobiliary: No focal liver abnormality is seen. Status post cholecystectomy. No biliary dilatation. Pancreas: Unremarkable. No pancreatic ductal dilatation or surrounding inflammatory  changes. Spleen: Normal in size without focal abnormality. Adrenals/Urinary Tract: Adrenal glands are unremarkable. Kidneys are normal, without renal calculi, focal lesion, or hydronephrosis. Bladder is unremarkable. Stomach/Bowel: The stomach is unremarkable. There is no evidence of bowel obstruction or inflammation. Status post appendectomy. Vascular/Lymphatic: Aortic atherosclerosis. No enlarged abdominal or pelvic lymph nodes. Reproductive: Status post hysterectomy. No adnexal masses. Other: No abdominal wall hernia or abnormality. No abdominopelvic ascites. Musculoskeletal: No acute or significant osseous findings. IMPRESSION: Small right pleural effusion with adjacent subsegmental atelectasis. Stable elevated left hemidiaphragm. No acute abnormality seen in the abdomen or pelvis. Aortic Atherosclerosis (ICD10-I70.0). Electronically Signed   By: Marijo Conception, M.D.   On: 06/10/2018 15:21   Dg Chest 2 View  Result Date: 06/10/2018 CLINICAL DATA:  Upper abdominal pain with nausea vomiting for 1 week. EXAM: CHEST - 2 VIEW COMPARISON:  10/16/2015 FINDINGS: Cardiomediastinal silhouette is enlarged. Mediastinal contours appear intact. Calcific atherosclerotic disease of the aorta. Chronic elevation of the left hemidiaphragm. Bilateral small pleural effusions and lower lobe atelectasis. Mild interstitial edema. Osteoarthritic changes of the left shoulder. Soft tissues are grossly normal. IMPRESSION: 1. Enlarged cardiac silhouette. 2. Mild interstitial edema. 3. Bilateral small pleural effusions and lower lobe atelectasis. Electronically Signed   By: Fidela Salisbury M.D.   On: 06/10/2018 14:12    Cardiac Studies   Echo pending. Being done at bedside. EF is severely reduced. Moderate to severe MR.   Patient Profile     85 y.o. female with a hx of CAD, macular degeneration, giant cell arteritis, CLL, hypertension, hyperlipidemia, hernia, GERD, diabetes type 2, chronic systolic heart failure, LBBB,  arthritis and anemia who is being seen today for the evaluation of elevated troponin at the request of Dr. Melina Copa.  Assessment & Plan    1. Acute on chronic systolic CHF. Exam with persistent JVD and rales. BNP elevated 3675. CXR c/w pulmonary edema. She was given IV lasix 20 mg once. Reports increased volume of urine but no I/O measured. She is clinically still volume overloaded. Will continue lasix 20 mg IV bid. Replete potassium. Strict I/O and daily weights. Continue irbesartan and Coreg. Will add aldactone 12.5 mg daily.  2. Elevated troponin secondary to demand ischemia with CHF. No anginal symptoms.  3. Epigastric/abdominal pain. Suspect related to large hiatal hernia but could be exacerbated by hepatic congestion/CHF.  4. CAD. Not a candidate for invasive evaluation. Will manage conservatively.  5.  HTN controlled.  6. HLD on Zetia. LDL 69.   Systolic heart failure acute/chronic  Cardiomyopathy-nonischemic  Left bundle branch block  Elevated troponin  Hypertension  Abdominal pain-resolved  The patient is euvolemic.  We will transition her Lasix to p.o.  With repeated hospitalizations for heart failure, I have broached the subject not withstanding her age of considering CRT-P.  I will forward this recommendation to Dr. Phillips Hay for her thoughts.  Anticipate might be able to go home tomorrow.  We will initiate Aldactone as suggested by Dr. [place] yesterday and will discontinue potassium   For questions or updates, please contact [place] Please consult [email or web address] for contact info under        Signed, Virl Axe, MD  06/12/2018, 11:08 AM

## 2018-06-12 NOTE — Progress Notes (Signed)
PROGRESS NOTE    Mary Cline  IZT:[id number] DOB: [birth year] DOA: 06/10/2018 PCP: [name], MD    Brief Narrative:  85 year old female who presented with intermittent abdominal pain.  She does have significant past medical history for chronic anemia, chronic systolic heart failure, type 2 diabetes mellitus, hypertension, gastroesophageal reflux disease and hiatal hernia.  Reported several weeks of abdominal pain, associated with nausea and decreased appetite.  Denies any chest pain or dyspnea.  On her initial physical examination blood pressure 136/80, heart rate 91, respiratory 24, temperature 97.4 F, oxygen saturation 96%, moist mucous membranes lungs are clear to auscultation bilaterally, heart S1-S2 present and rhythmic, abdomen soft, mild tender to deep palpation, no lower extremity edema.  Sodium 141, potassium 4.5, chloride 105, bicarb 28, glucose 107, BUN 21, creatinine 0.1, BNP 3675, troponin 1.0, white count 15.8, hemoglobin 11.4, hematocrit 36.0, platelets 169, urinalysis negative for infection.  Her chest radiograph had increased interstitial markings bilaterally, small bilateral pleural effusions, large hiatal hernia.  EKG 100 bpm, sinus rhythm, normal axis, left bundle branch block, T wave inversions in 2, 3 and aVF, poor R wave progression (chronic changes).  Patient was admitted to the hospital with working diagnosis of elevated troponins, to rule out acute coronary syndrome, complicated by acute on chronic systolic heart failure decompensation.  Assessment & Plan:   Principal Problem:   Recurrent abdominal pain Active Problems:   Essential hypertension   Elevated troponin I level   Hiatal hernia   Acute on chronic systolic and diastolic heart failure, NYHA class 2 (HCC)   Volume overload   Protein-calorie malnutrition, severe   1. Acute decompensation of systolic heart failure. Improved volume status, urine output over last 24 H, 1,100 ml. Continue  carvedilol, irbesartan and spironolactone. As needed furosemide. Troponin elevation due to decompensated heart failure. Echocardiogram with LV systolic function of 20 to 25%, with mild dilated cavity size.   2. HTN. Continue blood pressure control with carvedilol and irbesartan.   3. Hiatal hernia. Will continue antiacid therapy with proton pump inhibitors, will add sucralfate tid with meals.   4. Protein calorie malnutrition. Will continue nutritional supplements, will add physical therapy evaluation, out of bed to the chair tid with meals.    DVT prophylaxis: enoxaparin    Code Status: full Family Communication: I spoke with patient's daughter at the bedside and all questions were addressed.   Disposition Plan/ discharge barriers:  Possible dc in am, may need home health services.   Body mass index is 17.01 kg/m. Malnutrition Type:  Nutrition Problem: Severe Malnutrition Etiology: chronic illness(hiatal hernia, CHF)   Malnutrition Characteristics:  Signs/Symptoms: severe fat depletion, severe muscle depletion   Nutrition Interventions:  Interventions: Ensure Enlive (each supplement provides 350kcal and 20 grams of protein), Magic cup, Hormel Shake  RN Pressure Injury Documentation:     Consultants:   Cardiology   Procedures:     Antimicrobials:       Subjective: Patient with improved dyspnea, continue to be very weak and deconditioned, not yet back to her baseline. Continue to have intermittent chest pain. At home on daily omeprazole.   Objective: Vitals:   06/12/18 0508 06/12/18 0518 06/12/18 0727 06/12/18 1132  BP: 115/71  122/69 102/66  Pulse: 82  82 79  Resp: 18  18 18   Temp: 98.3 F (36.8 C)  98.1 F (36.7 C) 98 F (36.7 C)  TempSrc: Oral  Oral Oral  SpO2: 91% 94% 92% 95%  Weight:  Height:        Intake/Output Summary (Last 24 hours) at 06/12/2018 1253 Last data filed at 06/12/2018 1134 Gross per 24 hour  Intake 840 ml  Output 1800 ml   Net -960 ml   Filed Weights   06/11/18 0500 06/11/18 1741 06/12/18 0247  Weight: 44.2 kg 42 kg 40.8 kg    Examination:   General: deconditioned  Neurology: Awake and alert, non focal  E ENT: mild pallor, no icterus, oral mucosa moist Cardiovascular: No JVD. S1-S2 present, rhythmic, no gallops, rubs, or murmurs. No lower extremity edema. Pulmonary: positive breath sounds bilaterally, adequate air movement, no wheezing, or rhonchi, scattered rales. Gastrointestinal. Abdomen with no organomegaly, non tender, no rebound or guarding Skin. No rashes Musculoskeletal: no joint deformities     Data Reviewed: I have personally reviewed following labs and imaging studies  CBC: Recent Labs  Lab 06/10/18 1152 06/11/18 0539  WBC 14.4* 11.8*  HGB 11.1* 10.4*  HCT 35.3* 31.9*  MCV 95.1 95.5  PLT 141* 812*   Basic Metabolic Panel: Recent Labs  Lab 06/10/18 1152 06/11/18 0539 06/12/18 0450  NA 136 139 139  K 4.8 3.9 4.0  CL 103 105 101  CO2 27 26 27   GLUCOSE 152* 113* 128*  BUN 28* 26* 31*  CREATININE 0.90 0.97 1.05*  CALCIUM 9.2 8.8* 8.7*   GFR: Estimated Creatinine Clearance: 21.6 mL/min (A) (by C-G formula based on SCr of 1.05 mg/dL (H)). Liver Function Tests: Recent Labs  Lab 06/10/18 1152  AST 27  ALT 17  ALKPHOS 52  BILITOT 1.3*  PROT 5.9*  ALBUMIN 3.5   Recent Labs  Lab 06/10/18 1152  LIPASE 35   No results for input(s): AMMONIA in the last 168 hours. Coagulation Profile: Recent Labs  Lab 06/11/18 0539  INR 1.2   Cardiac Enzymes: Recent Labs  Lab 06/10/18 1430  TROPONINI 1.07*   BNP (last 3 results) No results for input(s): PROBNP in the last 8760 hours. HbA1C: No results for input(s): HGBA1C in the last 72 hours. CBG: Recent Labs  Lab 06/10/18 2121  GLUCAP 179*   Lipid Profile: Recent Labs    06/11/18 0539  CHOL 119  HDL 41  LDLCALC 69  TRIG 47  CHOLHDL 2.9   Thyroid Function Tests: No results for input(s): TSH, T4TOTAL,  FREET4, T3FREE, THYROIDAB in the last 72 hours. Anemia Panel: No results for input(s): VITAMINB12, FOLATE, FERRITIN, TIBC, IRON, RETICCTPCT in the last 72 hours.    Radiology Studies: I have reviewed all of the imaging during this hospital visit personally     Scheduled Meds: . aspirin EC  81 mg Oral Daily  . carvedilol  6.25 mg Oral BID  . feeding supplement  1 Container Oral TID BM  . feeding supplement (ENSURE ENLIVE)  237 mL Oral TID BM  . furosemide  20 mg Intravenous Q12H  . heparin  5,000 Units Subcutaneous Q8H  . irbesartan  150 mg Oral Daily  . mouth rinse  15 mL Mouth Rinse BID  . multivitamin with minerals  1 tablet Oral Daily  . pantoprazole  80 mg Oral Daily  . sodium chloride flush  3 mL Intravenous Q12H  . spironolactone  12.5 mg Oral Daily  . vitamin B-12  500 mcg Oral Q M,W,F   Continuous Infusions: . sodium chloride       LOS: 1 day        Frederick Klinger Gerome Apley, MD

## 2018-06-13 LAB — BASIC METABOLIC PANEL
Anion gap: 7 (ref 5–15)
BUN: 26 mg/dL — ABNORMAL HIGH (ref 8–23)
CO2: 31 mmol/L (ref 22–32)
Calcium: 8.4 mg/dL — ABNORMAL LOW (ref 8.9–10.3)
Chloride: 99 mmol/L (ref 98–111)
Creatinine, Ser: 1.1 mg/dL — ABNORMAL HIGH (ref 0.44–1.00)
GFR calc Af Amer: 50 mL/min — ABNORMAL LOW (ref >60)
GFR calc non Af Amer: 43 mL/min — ABNORMAL LOW (ref >60)
Glucose, Bld: 132 mg/dL — ABNORMAL HIGH (ref 70–99)
Potassium: 4.2 mmol/L (ref 3.5–5.1)
Sodium: 137 mmol/L (ref 135–145)

## 2018-06-13 MED ORDER — ENSURE ENLIVE PO LIQD: 237.0000 mL | Freq: Three times a day (TID) | ORAL | 0 refills | Status: AC

## 2018-06-13 MED ORDER — ENOXAPARIN SODIUM 30 MG/0.3ML ~~LOC~~ SOLN: 30.0000 mg | SUBCUTANEOUS | Status: DC

## 2018-06-13 MED ORDER — SPIRONOLACTONE 25 MG PO TABS: 12.5000 mg | ORAL_TABLET | Freq: Every day | ORAL | 0 refills | Status: DC

## 2018-06-13 NOTE — Evaluation (Signed)
Physical Therapy Evaluation Patient Details Name: Mary Cline MRN: [id number] DOB: [birth year] Today's Date: 06/13/2018   History of Present Illness  Pt is a 85 y.o. F with significant PMH of CHF, type 2 diabetes mellitus, hypertension who presents with intermittent abdominal pain. Admitted with working diagnosis of elevated troponins, to rule out acute coronary syndrome, complicated by acute on chronic systolic heart failure decompensation.  Clinical Impression  Pt admitted with above diagnosis. Pt currently with functional limitations due to the deficits listed below (see PT Problem List). Prior to admission, pt is a household ambulator using a walker and needs assist for ADL's. On PT evaluation, pt ambulating 150 feet with walker and supervision. HR 90-99 bpm. Presents with generalized weakness and decreased cardiovascular endurance. Pt will benefit from skilled PT to increase their independence and safety with mobility to allow discharge to the venue listed below.       Follow Up Recommendations Home health PT;Supervision for mobility/OOB    Equipment Recommendations  None recommended by PT    Recommendations for Other Services       Precautions / Restrictions Precautions Precautions: Fall Restrictions Weight Bearing Restrictions: No      Mobility  Bed Mobility Overal bed mobility: Modified Independent                Transfers Overall transfer level: Modified independent Equipment used: Rolling walker (2 wheeled)             General transfer comment: good technique  Ambulation/Gait Ambulation/Gait assistance: Supervision Gait Distance (Feet): 150 Feet Assistive device: Rolling walker (2 wheeled) Gait Pattern/deviations: Step-through pattern;Decreased stride length;Trunk flexed Gait velocity: decreased   General Gait Details: Pt overall slow and steady with trunk flexed posture throughout  Stairs            Wheelchair Mobility    Modified  Rankin (Stroke Patients Only)       Balance Overall balance assessment: Needs assistance Sitting-balance support: Feet supported Sitting balance-Leahy Scale: Good     Standing balance support: Single extremity supported Standing balance-Leahy Scale: Fair                               Pertinent Vitals/Pain Pain Assessment: No/denies pain    Home Living Family/patient expects to be discharged to:: Private residence Living Arrangements: Children(2 daughter's, husband) Available Help at Discharge: Family Type of Home: House Home Access: Ramped entrance(in garage)     Home Layout: Able to live on main level with bedroom/bathroom Home Equipment: [place] - 2 wheels;Wheelchair - Brewing technologist      Prior Function Level of Independence: Needs assistance   Gait / Transfers Assistance Needed: uses walker  ADL's / Homemaking Assistance Needed: assist for all ADL's/IADL's   Comments: retired Glass blower/designer Dominance   Dominant Hand: Right    Extremity/Trunk Assessment   Upper Extremity Assessment Upper Extremity Assessment: Generalized weakness    Lower Extremity Assessment Lower Extremity Assessment: Generalized weakness    Cervical / Trunk Assessment Cervical / Trunk Assessment: Kyphotic  Communication   Communication: No difficulties  Cognition Arousal/Alertness: Awake/alert Behavior During Therapy: WFL for tasks assessed/performed Overall Cognitive Status: Within Functional Limits for tasks assessed                                        General Comments  Exercises     Assessment/Plan    PT Assessment Patient needs continued PT services  PT Problem List Decreased strength;Decreased activity tolerance;Decreased mobility;Decreased balance       PT Treatment Interventions Gait training;DME instruction;Functional mobility training;Therapeutic activities;Therapeutic exercise;Balance training;Patient/family education    PT  Goals (Current goals can be found in the Care Plan section)  Acute Rehab PT Goals Patient Stated Goal: "work on exercises to get stronger." PT Goal Formulation: With patient Time For Goal Achievement: 06/27/18 Potential to Achieve Goals: Good    Frequency Min 3X/week   Barriers to discharge        Co-evaluation               AM-PAC PT "6 Clicks" Mobility  Outcome Measure Help needed turning from your back to your side while in a flat bed without using bedrails?: None Help needed moving from lying on your back to sitting on the side of a flat bed without using bedrails?: A Little Help needed moving to and from a bed to a chair (including a wheelchair)?: None Help needed standing up from a chair using your arms (e.g., wheelchair or bedside chair)?: None Help needed to walk in hospital room?: None Help needed climbing 3-5 steps with a railing? : A Little 6 Click Score: 22    End of Session   Activity Tolerance: Patient tolerated treatment well Patient left: in chair;with call bell/phone within reach Nurse Communication: Mobility status PT Visit Diagnosis: Unsteadiness on feet (R26.81);Muscle weakness (generalized) (M62.81)    Time: 3491-7915 PT Time Calculation (min) (ACUTE ONLY): 20 min   Charges:   PT Evaluation $PT Eval Moderate Complexity: 1 Mod         Ellamae Sia, [place], DPT Acute Rehabilitation Services Pager [phone] Office [phone]   Willy Eddy 06/13/2018, 9:52 AM

## 2018-06-13 NOTE — Discharge Summary (Addendum)
Physician Discharge Summary  Mary Cline [id number]:[id number] DOB: [birth year] DOA: 06/10/2018  PCP: [name], MD  Admit date: 06/10/2018 Discharge date: 06/13/2018  Admitted From: Home  Disposition:  Home   Recommendations for Outpatient Follow-up and new medication changes:  1. Follow up with Dr. [name] in 7 days.  2. Spironolactone has been added to heart heart failure regimen. 3. Continue furosemide as needed for hypervolemia. 4. Started on sucralfate with good response.   Home Health: Yes   Equipment/Devices: no    Discharge Condition: stable  CODE STATUS: full  Diet recommendation: heart healthy   Brief/Interim Summary: 85 year old female who presented with intermittent abdominal pain.  She does have significant past medical history for chronic anemia, chronic systolic heart failure, type 2 diabetes mellitus, hypertension, gastroesophageal reflux disease and hiatal hernia.  Reported several weeks of abdominal pain, associated with nausea and decreased appetite.  Denies any chest pain or dyspnea.  On her initial physical examination blood pressure 136/80, heart rate 91, respiratory rate 24, temperature 97.4 F, oxygen saturation 96%, moist mucous membranes, lungs were clear to auscultation bilaterally, heart S1-S2 present and rhythmic, abdomen soft, mild tender to deep palpation, no lower extremity edema.  Sodium 141, potassium 4.5, chloride 105, bicarb 28, glucose 107, BUN 21, creatinine 0.1, BNP 3675, troponin 1.0, white count 15.8, hemoglobin 11.4, hematocrit 36.0, platelets 169, urinalysis negative for infection.  Her chest radiograph had increased interstitial markings bilaterally, small bilateral pleural effusions, large hiatal hernia.  EKG 100 bpm, sinus rhythm, normal axis, left bundle branch block, T wave inversions in II, III and aVF, poor R wave progression (chronic changes).  Patient was admitted to the hospital with a working diagnosis of elevated troponins, to  rule out acute coronary syndrome, complicated by acute on chronic systolic heart failure decompensation.  1.  Acute on chronic systolic heart failure exacerbation.  Patient was admitted to the telemetry unit, she was diuresed with IV furosemide, and negative fluid balance was achieved, (-2,020 ml since admission), with significant improvement of her symptoms.  Further work-up with echocardiography showed ejection fraction of 20 to 25% on the left ventricle, positive diffuse hypokinesis.  No significant valvular disease.  Spironolactone has been added to her heart failure regimen, continue carvedilol and irbesartan.  Patient will continue taking furosemide at home as needed for signs of volume overload.  Her troponin elevation was due to heart failure decompensation, patient ruled out for acute coronary syndrome.   2.  Hypertension.  Blood pressure remained well controlled, continue carvedilol and irbesartan.  Patient will be discharged on spironolactone as well.  3.  Hiatal hernia.  Patient has significant reflux disease, sucralfate was added with improvement of her symptoms.  Continue proton pump inhibitors.  Advised to avoid supine position after meals.  4.  Protein calorie malnutrition.  Patient was evaluated by Nutrisionist and supplements were added.  5. Dyslipidemia. Continue ezetimibe.   Discharge Diagnoses:  Principal Problem:   Recurrent abdominal pain Active Problems:   Essential hypertension   Elevated troponin I level   Hiatal hernia   Acute on chronic systolic and diastolic heart failure, NYHA class 2 (HCC)   Volume overload   Protein-calorie malnutrition, severe    Discharge Instructions   Allergies as of 06/13/2018      Reactions   Fish Allergy Anaphylaxis   Iodine Anaphylaxis   Actonel [risedronate Sodium] Nausea And Vomiting   Atorvastatin Other (See Comments)   Blisters in mouth.   Blisters in mouth.  Ciprofloxacin Nausea And Vomiting   Codeine Other (See  Comments)   Headache Headache   Corticotropin Other (See Comments)   Confusion Confusion   Demeclocycline    Stomach pain   Dihydrotachysterol    Dizzy confused   Garamycin [gentamicin Sulfate] Rash   Lactose Intolerance (gi) Other (See Comments)   Intolerance.     Lactulose    Intolerance.     Lovastatin Other (See Comments)   blisters blisters   Macrobid [nitrofurantoin Monohyd Macro] Nausea And Vomiting   Meperidine Other (See Comments)   Hypotension Hypotension   Other Hives, Swelling   Melons and strawberries.     Paxil [paroxetine Hcl] Nausea Only   Penicillins Other (See Comments)   Did it involve swelling of the face/tongue/throat, SOB, or low BP? No Did it involve sudden or severe rash/hives, skin peeling, or any reaction on the inside of your mouth or nose? Unknown Did you need to seek medical attention at a hospital or doctor's office? Unknown When did it last happen?Childhood If all above answers are "NO", may proceed with cephalosporin use. Pain.     Rofecoxib Other (See Comments)   Not known Not known   Sulfa Antibiotics Swelling   Tetracyclines & Related Other (See Comments)   Stomach pain   Vitamin D Analogs Other (See Comments)   Dizzy confused      Medication List    TAKE these medications   aspirin 81 MG tablet Take 81 mg by mouth daily.   carvedilol 6.25 MG tablet Commonly known as:  COREG Take 1 tablet (6.25 mg total) by mouth 2 (two) times daily.   ezetimibe 10 MG tablet Commonly known as:  ZETIA Take 10 mg by mouth daily.   feeding supplement (ENSURE ENLIVE) Liqd Take 237 mLs by mouth 3 (three) times daily between meals for 30 days.   furosemide 20 MG tablet Commonly known as:  LASIX Take 1 tablet (20 mg total) by mouth as needed. For weight gain more that 2 pounds in a day or 5 pounds in a week.   glycerin adult 2 g Supp Place 1 suppository rectally once as needed for moderate constipation.   ibandronate 150 MG  tablet Commonly known as:  BONIVA Take 150 mg by mouth every 30 (thirty) days. Take in the morning with a full glass of water, on an empty stomach, and do not take anything else by mouth or lie down for the next 30 min.   irbesartan 150 MG tablet Commonly known as:  AVAPRO Take 1 tablet (150 mg total) daily by mouth.   multivitamin tablet Take 1 tablet by mouth daily.   NONFORMULARY OR COMPOUNDED ITEM Apply 1 application topically as needed (apply to bottom area). Ketoconazole and fluticasone compounded cream.  Apply as needed to affected areas as needed as direct   omeprazole 40 MG capsule Commonly known as:  PRILOSEC Take 40 mg by mouth daily.   ondansetron 4 MG tablet Commonly known as:  ZOFRAN Take 1 tablet (4 mg total) by mouth every 8 (eight) hours as needed for nausea or vomiting.   spironolactone 25 MG tablet Commonly known as:  ALDACTONE Take 0.5 tablets (12.5 mg total) by mouth daily for 30 days. Start taking on:  June 14, 2018   sucralfate 1 g tablet Commonly known as:  Carafate Take 1 tablet (1 g total) by mouth 4 (four) times daily -  with meals and at bedtime.   traMADol 50 MG tablet Commonly known as:  ULTRAM Take 1 tablet (50 mg total) by mouth 3 (three) times daily as needed for severe pain.   Vitamin B 12 500 MCG Tabs Take 500 mcg by mouth every Monday, Wednesday, and Friday.      Follow-up Information    Schedule an appointment as soon as possible for a visit  with Rankins, Bill Salinas, MD.   Specialty:  Family Medicine Why:  For recheck of your symptoms Contact information: [place] [address] [phone]          Allergies  Allergen Reactions  . Fish Allergy Anaphylaxis  . Iodine Anaphylaxis  . Actonel [Risedronate Sodium] Nausea And Vomiting  . Atorvastatin Other (See Comments)    Blisters in mouth.   Blisters in mouth.    . Ciprofloxacin Nausea And Vomiting  . Codeine Other (See Comments)     Headache  Headache  . Corticotropin Other (See Comments)    Confusion  Confusion  . Demeclocycline     Stomach pain  . Dihydrotachysterol     Dizzy confused  . Garamycin [Gentamicin Sulfate] Rash  . Lactose Intolerance (Gi) Other (See Comments)    Intolerance.    . Lactulose     Intolerance.    . Lovastatin Other (See Comments)    blisters blisters  . Macrobid [Nitrofurantoin Monohyd Macro] Nausea And Vomiting  . Meperidine Other (See Comments)    Hypotension  Hypotension  . Other Hives and Swelling    Melons and strawberries.    . Paxil [Paroxetine Hcl] Nausea Only  . Penicillins Other (See Comments)    Did it involve swelling of the face/tongue/throat, SOB, or low BP? No Did it involve sudden or severe rash/hives, skin peeling, or any reaction on the inside of your mouth or nose? Unknown Did you need to seek medical attention at a hospital or doctor's office? Unknown When did it last happen?Childhood If all above answers are "NO", may proceed with cephalosporin use. Pain.    . Rofecoxib Other (See Comments)    Not known Not known  . Sulfa Antibiotics Swelling  . Tetracyclines & Related Other (See Comments)    Stomach pain  . Vitamin D Analogs Other (See Comments)    Dizzy confused    Consultations:  Cardiology    Procedures/Studies: Ct Abdomen Pelvis Wo Contrast  Result Date: 06/10/2018 CLINICAL DATA:  Acute generalized abdominal pain. EXAM: CT ABDOMEN AND PELVIS WITHOUT CONTRAST TECHNIQUE: Multidetector CT imaging of the abdomen and pelvis was performed following the standard protocol without IV contrast. COMPARISON:  CT scan of October 31, 2013. FINDINGS: Lower chest: Elevated left hemidiaphragm is noted. Small right pleural effusion is noted with adjacent subsegmental atelectasis. Hepatobiliary: No focal liver abnormality is seen. Status post cholecystectomy. No biliary dilatation. Pancreas: Unremarkable. No pancreatic ductal dilatation or surrounding  inflammatory changes. Spleen: Normal in size without focal abnormality. Adrenals/Urinary Tract: Adrenal glands are unremarkable. Kidneys are normal, without renal calculi, focal lesion, or hydronephrosis. Bladder is unremarkable. Stomach/Bowel: The stomach is unremarkable. There is no evidence of bowel obstruction or inflammation. Status post appendectomy. Vascular/Lymphatic: Aortic atherosclerosis. No enlarged abdominal or pelvic lymph nodes. Reproductive: Status post hysterectomy. No adnexal masses. Other: No abdominal wall hernia or abnormality. No abdominopelvic ascites. Musculoskeletal: No acute or significant osseous findings. IMPRESSION: Small right pleural effusion with adjacent subsegmental atelectasis. Stable elevated left hemidiaphragm. No acute abnormality seen in the abdomen or pelvis. Aortic Atherosclerosis (ICD10-I70.0). Electronically Signed   By: Marijo Conception, M.D.  On: 06/10/2018 15:21   Dg Chest 2 View  Result Date: 06/10/2018 CLINICAL DATA:  Upper abdominal pain with nausea vomiting for 1 week. EXAM: CHEST - 2 VIEW COMPARISON:  10/16/2015 FINDINGS: Cardiomediastinal silhouette is enlarged. Mediastinal contours appear intact. Calcific atherosclerotic disease of the aorta. Chronic elevation of the left hemidiaphragm. Bilateral small pleural effusions and lower lobe atelectasis. Mild interstitial edema. Osteoarthritic changes of the left shoulder. Soft tissues are grossly normal. IMPRESSION: 1. Enlarged cardiac silhouette. 2. Mild interstitial edema. 3. Bilateral small pleural effusions and lower lobe atelectasis. Electronically Signed   By: Fidela Salisbury M.D.   On: 06/10/2018 14:12       Subjective: Patient has been feeling better, dyspnea has improved, no lower extremity edema, able to ambulate with physical therapy this am. Improved reflux symptoms with sucralfate.   Discharge Exam: Vitals:   06/13/18 0417 06/13/18 0803  BP: 122/67 120/64  Pulse: 74 82  Resp: 18    Temp: 97.8 F (36.6 C)   SpO2: 98% 98%   Vitals:   06/12/18 1633 06/12/18 2120 06/13/18 0417 06/13/18 0803  BP: 104/60 116/67 122/67 120/64  Pulse: 76 79 74 82  Resp: 18 18 18    Temp: 98.3 F (36.8 C) 98.1 F (36.7 C) 97.8 F (36.6 C)   TempSrc: Oral Oral Oral   SpO2: 94% 96% 98% 98%  Weight:   40.4 kg   Height:        General: Not in pain or dyspnea Neurology: Awake and alert, non focal  E ENT: mild pallor, no icterus, oral mucosa moist Cardiovascular: No JVD. S1-S2 present, rhythmic, no gallops, rubs, or murmurs. No lower extremity edema. Pulmonary: positive breath sounds bilaterally, adequate air movement, no wheezing, rhonchi or rales. Gastrointestinal. Abdomen with no organomegaly, non tender, no rebound or guarding Skin. No rashes Musculoskeletal: no joint deformities   The results of significant diagnostics from this hospitalization (including imaging, microbiology, ancillary and laboratory) are listed below for reference.     Microbiology: No results found for this or any previous visit (from the past 240 hour(s)).   Labs: BNP (last 3 results) Recent Labs    06/11/18 0539  BNP 3,299.2*   Basic Metabolic Panel: Recent Labs  Lab 06/10/18 1152 06/11/18 0539 06/12/18 0450  NA 136 139 139  K 4.8 3.9 4.0  CL 103 105 101  CO2 27 26 27   GLUCOSE 152* 113* 128*  BUN 28* 26* 31*  CREATININE 0.90 0.97 1.05*  CALCIUM 9.2 8.8* 8.7*   Liver Function Tests: Recent Labs  Lab 06/10/18 1152  AST 27  ALT 17  ALKPHOS 52  BILITOT 1.3*  PROT 5.9*  ALBUMIN 3.5   Recent Labs  Lab 06/10/18 1152  LIPASE 35   No results for input(s): AMMONIA in the last 168 hours. CBC: Recent Labs  Lab 06/10/18 1152 06/11/18 0539  WBC 14.4* 11.8*  HGB 11.1* 10.4*  HCT 35.3* 31.9*  MCV 95.1 95.5  PLT 141* 125*   Cardiac Enzymes: Recent Labs  Lab 06/10/18 1430  TROPONINI 1.07*   BNP: Invalid input(s): POCBNP CBG: Recent Labs  Lab 06/10/18 2121  GLUCAP 179*    D-Dimer No results for input(s): DDIMER in the last 72 hours. Hgb A1c No results for input(s): HGBA1C in the last 72 hours. Lipid Profile Recent Labs    06/11/18 0539  CHOL 119  HDL 41  LDLCALC 69  TRIG 47  CHOLHDL 2.9   Thyroid function studies No results for input(s): TSH, T4TOTAL,  T3FREE, THYROIDAB in the last 72 hours.  Invalid input(s): FREET3 Anemia work up No results for input(s): VITAMINB12, FOLATE, FERRITIN, TIBC, IRON, RETICCTPCT in the last 72 hours. Urinalysis    Component Value Date/Time   COLORURINE YELLOW 06/10/2018 1447   APPEARANCEUR CLEAR 06/10/2018 1447   LABSPEC 1.008 06/10/2018 1447   PHURINE 6.0 06/10/2018 1447   GLUCOSEU NEGATIVE 06/10/2018 1447   HGBUR SMALL (A) 06/10/2018 1447   BILIRUBINUR NEGATIVE 06/10/2018 1447   KETONESUR NEGATIVE 06/10/2018 1447   PROTEINUR NEGATIVE 06/10/2018 1447   UROBILINOGEN 0.2 09/16/2011 1940   NITRITE NEGATIVE 06/10/2018 1447   LEUKOCYTESUR TRACE (A) 06/10/2018 1447   Sepsis Labs Invalid input(s): PROCALCITONIN,  WBC,  LACTICIDVEN Microbiology No results found for this or any previous visit (from the past 240 hour(s)).   Time coordinating discharge: 45 minutes  SIGNED:   Tawni Millers, MD  [facility] 06/13/2018, 9:12 AM

## 2018-06-21 ENCOUNTER — Telehealth: Payer: Self-pay | Admitting: Cardiovascular Disease

## 2018-06-21 DIAGNOSIS — K219 Gastro-esophageal reflux disease without esophagitis: Secondary | ICD-10-CM | POA: Diagnosis not present

## 2018-06-21 DIAGNOSIS — M810 Age-related osteoporosis without current pathological fracture: Secondary | ICD-10-CM | POA: Diagnosis not present

## 2018-06-21 DIAGNOSIS — E1140 Type 2 diabetes mellitus with diabetic neuropathy, unspecified: Secondary | ICD-10-CM | POA: Diagnosis not present

## 2018-06-21 DIAGNOSIS — I509 Heart failure, unspecified: Secondary | ICD-10-CM | POA: Diagnosis not present

## 2018-06-21 DIAGNOSIS — I10 Essential (primary) hypertension: Secondary | ICD-10-CM | POA: Diagnosis not present

## 2018-06-21 DIAGNOSIS — C9110 Chronic lymphocytic leukemia of B-cell type not having achieved remission: Secondary | ICD-10-CM | POA: Diagnosis not present

## 2018-06-21 NOTE — Telephone Encounter (Signed)
DPR on file. Returned the call to the pt daughter Mary Cline. Mary Cline sts that the pt has been doing ok from a cardiac standpoint. She denies chest pain, palpitations, sob, swelling. The pt appetite is had returned. The pt sts complains of fatigue, her pcp has placed a ref for at home PT. They are just concerned since they were instructed to f/u with Cardiology asap after her 06/13/18 hospital d/c. Pt has an appt with Dr.De Smet on  08/26/18 that was scheduled previously to the pt recent hospitalization. She is concerned that the appt is to far out. Adv her that we are rolling out e-visits soon but the pt will need to sign up for mychart. Mary Cline sts that neither she or the pt have a computer, her sister that lives with them does. The sister will call back to provide her email address so that  mychart sign up can be initiated.   If an e-visit cannot be set up they would like Dr.[place]'s recommendation on when a f/u should be scheduled. Mary Cline that I will fwd the message to Dr.[place] to advise.

## 2018-06-21 NOTE — Telephone Encounter (Signed)
New Message   Patient was just discharged from the hospital a few days ago and was told to see cardiologist as soon as possible and there are no appointments until 5/28.  Patient wants to know if anyway they could come in sooner.

## 2018-06-22 NOTE — Telephone Encounter (Signed)
Can we set her up for a virtual visit sometime this week?  Doesn't matter to me when it is as long as it isn't Friday afternoon.

## 2018-06-22 NOTE — Telephone Encounter (Signed)
Left message to call back  

## 2018-06-23 DIAGNOSIS — M810 Age-related osteoporosis without current pathological fracture: Secondary | ICD-10-CM | POA: Diagnosis not present

## 2018-06-23 DIAGNOSIS — Z9181 History of falling: Secondary | ICD-10-CM | POA: Diagnosis not present

## 2018-06-23 DIAGNOSIS — F320 Major depressive disorder, single episode, mild: Secondary | ICD-10-CM | POA: Diagnosis not present

## 2018-06-23 DIAGNOSIS — E1140 Type 2 diabetes mellitus with diabetic neuropathy, unspecified: Secondary | ICD-10-CM | POA: Diagnosis not present

## 2018-06-23 DIAGNOSIS — D510 Vitamin B12 deficiency anemia due to intrinsic factor deficiency: Secondary | ICD-10-CM | POA: Diagnosis not present

## 2018-06-23 DIAGNOSIS — I110 Hypertensive heart disease with heart failure: Secondary | ICD-10-CM | POA: Diagnosis not present

## 2018-06-23 DIAGNOSIS — I509 Heart failure, unspecified: Secondary | ICD-10-CM | POA: Diagnosis not present

## 2018-06-23 DIAGNOSIS — R2689 Other abnormalities of gait and mobility: Secondary | ICD-10-CM | POA: Diagnosis not present

## 2018-06-24 ENCOUNTER — Encounter: Payer: Self-pay | Admitting: *Deleted

## 2018-06-24 ENCOUNTER — Telehealth (INDEPENDENT_AMBULATORY_CARE_PROVIDER_SITE_OTHER): Payer: Medicare PPO | Admitting: Cardiovascular Disease

## 2018-06-24 DIAGNOSIS — I2510 Atherosclerotic heart disease of native coronary artery without angina pectoris: Secondary | ICD-10-CM | POA: Diagnosis not present

## 2018-06-24 DIAGNOSIS — E7800 Pure hypercholesterolemia, unspecified: Secondary | ICD-10-CM

## 2018-06-24 DIAGNOSIS — R778 Other specified abnormalities of plasma proteins: Secondary | ICD-10-CM

## 2018-06-24 DIAGNOSIS — I110 Hypertensive heart disease with heart failure: Secondary | ICD-10-CM

## 2018-06-24 DIAGNOSIS — I5022 Chronic systolic (congestive) heart failure: Secondary | ICD-10-CM | POA: Diagnosis not present

## 2018-06-24 DIAGNOSIS — I10 Essential (primary) hypertension: Secondary | ICD-10-CM

## 2018-06-24 DIAGNOSIS — R7989 Other specified abnormal findings of blood chemistry: Secondary | ICD-10-CM | POA: Diagnosis not present

## 2018-06-24 NOTE — Patient Instructions (Signed)
Medication Instructions:  Your physician recommends that you continue on your current medications as directed. Please refer to the Current Medication list given to you today.  If you need a refill on your cardiac medications before your next appointment, please call your pharmacy.   Lab work: NONE   Testing/Procedures: NONE  Follow-Up: KEEP MAY VISIT WITH DR Waldo AS SCHEDULED   You have been referred to DR Caryl Comes AT [address] FOR VISIT IN 2 MONTHS. THE OFFICE WILL CONTACT YOU FOR APPOINTMENT. IF YOU DO NOT HEAR WITHIN 2 WEEKS CALL THE OFFICE DIRECTLY AT [phone]

## 2018-06-24 NOTE — Progress Notes (Signed)
Virtual Visit via Video Note    Evaluation Performed:  Follow-up visit  This visit type was conducted due to national recommendations for restrictions regarding the COVID-19 Pandemic (e.g. social distancing).  This format is felt to be most appropriate for this patient at this time.  All issues noted in this document were discussed and addressed.  No physical exam was performed (except for noted visual exam findings with Video Visits).  Please refer to the patient's chart (MyChart message for video visits and phone note for telephone visits) for the patient's consent to telehealth for [facility].  Date:  06/25/2018   ID:  Mary Cline, DOB 08-Aug-1922, MRN [id number]  Patient Location:  [place] [address]   Provider location:   [address], [place], [place]  PCP:  Rankins, Bill Salinas, MD  Cardiologist:  Skeet Latch, MD  Electrophysiologist:  None   Chief Complaint:  Hospital follow up  History of Present Illness:    Mary Cline is a 85 y.o. female with hypertension, diabetes, GERD, giant cell arteritis, CLL, chronic systolic heart failure LVEF 15-20%, and LBBB who presents via video conferencing for a telehealth visit today.   Mary Cline was seen by [name] (cardiolgist, [facility]) on 11/2014.  At that time she was following up after a hospitalization for systolic heart failure that was likely ischemic.  She was initially hospitalized for shortness of breath and was found to have an NSTEMI.  Troponin peaked at 0.17.  BNP was 33,000. EKG revealed LBBB and no prior EKGs were available for comparison.  Her symptoms improved with diuresis.   Her family decided not to pursue aggressive measures given her age. Mary Cline has been unable to get financial assistance to afford Entresto.  She was switched to irbesartan.  Carvedilol was increased due to poorly controlled BP.   Since her last appointment Mary Cline was  admitted to the hospital with a heart failure exacerbation.  Her presenting symptom was abdominal discomfort and nausea.  She had minimal shortness of breath and no edema.  She was diuresed with IV lasix.  Troponin was mildly elevated and thought to be attributable to demand ischemia.  Since leaving the hospital her weight has been stable between 90-92 lb.  It has been 92lb for the last couple days so she took a lasix and did note increased urine output.  She denies chest pain, shortness of breath or edema.  She is using an exercise bike and feels generally well.  She has been getting home PT.  Her appetite has been poor but she is trying to drink meal supplements between meals.  She is staying with her daughters and practicing social distancing.  The patient does not symptoms concerning for COVID-19 infection (fever, chills, cough, or new SHORTNESS OF BREATH).    Prior CV studies:   The following studies were reviewed today:  Echo 06/11/18: IMPRESSIONS    1. The left ventricle has severely reduced systolic function, with an ejection fraction of 20-25%. The cavity size was mildly dilated. Left ventricular diastolic Doppler parameters are consistent with impaired relaxation. Left ventricular diffuse  hypokinesis.  2. The right ventricle has normal systolic function. The cavity was normal. There is no increase in right ventricular wall thickness.  3. Left atrial size was moderately dilated.  4. Trivial pericardial effusion is present.  5. Mild thickening of the mitral valve leaflet. Mild calcification of the mitral valve leaflet. Mitral valve regurgitation  is moderate by color flow Doppler. Eccentric, anteriorly-directed mitral regurgitation with restriction of posterior mitral  leaflet. No evidence of mitral valve stenosis.  6. The aortic valve is tricuspid Mild calcification of the aortic valve. no stenosis of the aortic valve.  7. The ascending aorta and aortic root are normal in size and  structure.  8. Normal IVC size. No complete TR doppler jet so unable to estimate PA systolic pressure. 8  Past Medical History:  Diagnosis Date  . Anemia   . Arthritis   . Chronic systolic heart failure ([place]) 04/03/2015  . Dementia ([place])   . Diabetes mellitus   . Essential hypertension 04/03/2015  . GERD (gastroesophageal reflux disease)   . Hernia   . Hyperlipidemia 04/03/2015  . Hypertension   . Macular degeneration disease    Past Surgical History:  Procedure Laterality Date  . ANGIOPLASTY    . APPENDECTOMY    . HYSTEROTOMY       Current Meds  Medication Sig  . aspirin 81 MG tablet Take 81 mg by mouth daily.  . carvedilol (COREG) 6.25 MG tablet Take 1 tablet (6.25 mg total) by mouth 2 (two) times daily.  . Cyanocobalamin (VITAMIN B 12) 500 MCG TABS Take 500 mcg by mouth every Monday, Wednesday, and Friday.   . ezetimibe (ZETIA) 10 MG tablet Take 10 mg by mouth daily.  . feeding supplement, ENSURE ENLIVE, (ENSURE ENLIVE) LIQD Take 237 mLs by mouth 3 (three) times daily between meals for 30 days.  . furosemide (LASIX) 20 MG tablet Take 1 tablet (20 mg total) by mouth as needed. For weight gain more that 2 pounds in a day or 5 pounds in a week.  Marland Kitchen glycerin adult 2 G SUPP Place 1 suppository rectally once as needed for moderate constipation.  . ibandronate (BONIVA) 150 MG tablet Take 150 mg by mouth every 30 (thirty) days. Take in the morning with a full glass of water, on an empty stomach, and do not take anything else by mouth or lie down for the next 30 min.  . irbesartan (AVAPRO) 150 MG tablet Take 1 tablet (150 mg total) daily by mouth.  . Multiple Vitamin (MULTIVITAMIN) tablet Take 1 tablet by mouth daily.  . NONFORMULARY OR COMPOUNDED ITEM Apply 1 application topically as needed (apply to bottom area). Ketoconazole and fluticasone compounded cream.  Apply as needed to affected areas as needed as direct  . omeprazole (PRILOSEC) 40 MG capsule Take 40 mg by mouth daily.  .  ondansetron (ZOFRAN) 4 MG tablet Take 1 tablet (4 mg total) by mouth every 8 (eight) hours as needed for nausea or vomiting.  Marland Kitchen spironolactone (ALDACTONE) 25 MG tablet Take 0.5 tablets (12.5 mg total) by mouth daily for 30 days.  . sucralfate (CARAFATE) 1 g tablet Take 1 tablet (1 g total) by mouth 4 (four) times daily -  with meals and at bedtime.  . traMADol (ULTRAM) 50 MG tablet Take 1 tablet (50 mg total) by mouth 3 (three) times daily as needed for severe pain.     Allergies:   Fish allergy; Iodine; Actonel [risedronate sodium]; Atorvastatin; Ciprofloxacin; Codeine; Corticotropin; Demeclocycline; Dihydrotachysterol; Garamycin [gentamicin sulfate]; Lactose intolerance (gi); Lactulose; Lovastatin; Macrobid [nitrofurantoin monohyd macro]; Meperidine; Other; Paxil [paroxetine hcl]; Penicillins; Rofecoxib; Sulfa antibiotics; Tetracyclines & related; and Vitamin d analogs   Social History   Tobacco Use  . Smoking status: Never Smoker  . Smokeless tobacco: Never Used  Substance Use Topics  . Alcohol use: No  .  Drug use: No     Family Hx: The patient's family history includes Cancer in her sister; Diabetes in her brother, brother, brother, and maternal grandmother; Heart Problems in her daughter, daughter, maternal grandfather, paternal grandfather, and paternal grandmother.  ROS:   Please see the history of present illness.     All other systems reviewed and are negative.   Labs/Other Tests and Data Reviewed:    Recent Labs: 06/10/2018: ALT 17 06/11/2018: B Natriuretic Peptide 3,675.2; Hemoglobin 10.4; Platelets 125 06/13/2018: BUN 26; Creatinine, Ser 1.10; Potassium 4.2; Sodium 137   Recent Lipid Panel Lab Results  Component Value Date/Time   CHOL 119 06/11/2018 05:39 AM   CHOL 138 08/11/2017 10:28 AM   TRIG 47 06/11/2018 05:39 AM   HDL 41 06/11/2018 05:39 AM   HDL 53 08/11/2017 10:28 AM   CHOLHDL 2.9 06/11/2018 05:39 AM   LDLCALC 69 06/11/2018 05:39 AM   LDLCALC 71 08/11/2017  10:28 AM    Wt Readings from Last 3 Encounters:  06/24/18 92 lb (41.7 kg)  06/13/18 89 lb (40.4 kg)  04/12/18 91 lb 4.8 oz (41.4 kg)     Exam:    Vital Signs: BP 118/64   Pulse 60   Wt 92 lb (41.7 kg)   BMI 17.38 kg/m  , BMI Body mass index is 17.38 kg/m. GENERAL:  Well appearing elderly woman in no acute distress HEENT: Pupils equal round and reactive.  Oral mucosa unremarkable NECK:  No jugular venous distention.  No obvious thyromegaly EXT:  2 plus pulses throughout, no edema, no cyanosis no clubbing SKIN:  No rashes no nodules NEURO:  Cranial nerves II through XII grossly intact, motor grossly intact throughout PSYCH:  Cognitively intact, oriented to person place and time   ASSESSMENT & PLAN:     # Chronic systolic heart failure: # Hypertension:  Ms. Cunning is doing well after discharge and her weight is stable.  She has been treated for multiple heart failure hospitalizations.  Unfortunately she hasn't been able to get on Entresto 2/2 cost.  She was seen by Dr. Caryl Comes in the hospital and would be a potential candidate for CRT-P.  She is interestd in this .  We will arrange for her to follow up with him in clinic in two months.  Hopefully the COVID-19 limitations on non-emergent procedures will have been lifted by then.  She is euvolemic on exam.  BP well-controlled. Continue carvedilol, irbesartan, spironolactone, and furosemide.  # CAD s/p NSTEMI: Ms. Saulnier had a mild NSTEMI 11/2014 that has been medically managed.  Troponin was elevated to 1.07 in the hospital 05/2018 but it was felt to be due to demand ischemia.  She has not experienced any chest pain.   Continue aspirin, carvedilol and Zetia.  She hasn't tolerated statins and no plans for PCSK9 inhibitor 2/2 age.    COVID-19 Education: The signs and symptoms of COVID-19 were discussed with the patient and how to seek care for testing (follow up with PCP or arrange E-visit).  The importance of social distancing was  discussed today.  Patient Risk:   After full review of this patients clinical status, I feel that they are at least moderate risk at this time.  Time:   Today, I have spent 26 minutes with the patient with telehealth technology discussing heart failure, CRT-P.     Medication Adjustments/Labs and Tests Ordered: Current medicines are reviewed at length with the patient today.  Concerns regarding medicines are outlined above.  Tests  Ordered: No orders of the defined types were placed in this encounter.  Medication Changes: No orders of the defined types were placed in this encounter.   Disposition:  follow up with Dr. Caryl Comes in 2 months and Dr. Oval Linsey 07/2018 as scheduled  Signed, Skeet Latch, MD  06/25/2018 11:53 AM    [place]

## 2018-06-24 NOTE — Telephone Encounter (Signed)
Patient had virtual visit today.

## 2018-06-29 DIAGNOSIS — D510 Vitamin B12 deficiency anemia due to intrinsic factor deficiency: Secondary | ICD-10-CM | POA: Diagnosis not present

## 2018-06-29 DIAGNOSIS — E1140 Type 2 diabetes mellitus with diabetic neuropathy, unspecified: Secondary | ICD-10-CM | POA: Diagnosis not present

## 2018-06-29 DIAGNOSIS — M810 Age-related osteoporosis without current pathological fracture: Secondary | ICD-10-CM | POA: Diagnosis not present

## 2018-06-29 DIAGNOSIS — I509 Heart failure, unspecified: Secondary | ICD-10-CM | POA: Diagnosis not present

## 2018-06-29 DIAGNOSIS — I110 Hypertensive heart disease with heart failure: Secondary | ICD-10-CM | POA: Diagnosis not present

## 2018-06-29 DIAGNOSIS — F320 Major depressive disorder, single episode, mild: Secondary | ICD-10-CM | POA: Diagnosis not present

## 2018-06-29 DIAGNOSIS — R2689 Other abnormalities of gait and mobility: Secondary | ICD-10-CM | POA: Diagnosis not present

## 2018-06-29 DIAGNOSIS — Z9181 History of falling: Secondary | ICD-10-CM | POA: Diagnosis not present

## 2018-07-06 ENCOUNTER — Telehealth: Payer: Self-pay | Admitting: Cardiovascular Disease

## 2018-07-06 MED ORDER — SPIRONOLACTONE 25 MG PO TABS: 12.5000 mg | ORAL_TABLET | Freq: Every day | ORAL | 0 refills | Status: DC

## 2018-07-06 NOTE — Telephone Encounter (Signed)
Called patient, she states when her mother was in the hospital they placed her on the Spironolactone, during the telehealth visit, they started her on Lasix due to her weight. Currently she is not on lasix anymore- and her mothers weight has maintained in the 90lb-91lbs. She states there are no refills the Spironolactone medication and they wanted to make sure with Dr. that she is okay to continue it.   Please advise.  Thanks!

## 2018-07-06 NOTE — Telephone Encounter (Signed)
New message:   Patient daughter calling concerning her mothers medication. Please call daughter back.

## 2018-07-06 NOTE — Telephone Encounter (Signed)
Called, spoke with daughter. Advised of message.  Patient daughter verbalized understanding, Refill sent into pharmacy.

## 2018-07-06 NOTE — Telephone Encounter (Signed)
Yes this is OK 

## 2018-07-31 ENCOUNTER — Other Ambulatory Visit: Payer: Self-pay | Admitting: Cardiovascular Disease

## 2018-07-31 NOTE — Telephone Encounter (Signed)
Spironolactone 25 mg refilled 

## 2018-08-08 ENCOUNTER — Encounter (HOSPITAL_COMMUNITY): Payer: Self-pay

## 2018-08-08 ENCOUNTER — Emergency Department (HOSPITAL_COMMUNITY)
Admission: EM | Admit: 2018-08-08 | Discharge: 2018-08-08 | Disposition: A | Discharge status: Home / Self Care | Payer: Medicare PPO | Attending: Emergency Medicine | Admitting: Emergency Medicine

## 2018-08-08 ENCOUNTER — Other Ambulatory Visit: Payer: Self-pay

## 2018-08-08 ENCOUNTER — Emergency Department (HOSPITAL_COMMUNITY): Payer: Medicare PPO

## 2018-08-08 DIAGNOSIS — Y999 Unspecified external cause status: Secondary | ICD-10-CM | POA: Insufficient documentation

## 2018-08-08 DIAGNOSIS — I110 Hypertensive heart disease with heart failure: Secondary | ICD-10-CM | POA: Insufficient documentation

## 2018-08-08 DIAGNOSIS — Y929 Unspecified place or not applicable: Secondary | ICD-10-CM | POA: Insufficient documentation

## 2018-08-08 DIAGNOSIS — Z7982 Long term (current) use of aspirin: Secondary | ICD-10-CM | POA: Diagnosis not present

## 2018-08-08 DIAGNOSIS — Y9389 Activity, other specified: Secondary | ICD-10-CM | POA: Diagnosis not present

## 2018-08-08 DIAGNOSIS — W19XXXA Unspecified fall, initial encounter: Secondary | ICD-10-CM

## 2018-08-08 DIAGNOSIS — I5042 Chronic combined systolic (congestive) and diastolic (congestive) heart failure: Secondary | ICD-10-CM | POA: Insufficient documentation

## 2018-08-08 DIAGNOSIS — F0390 Unspecified dementia without behavioral disturbance: Secondary | ICD-10-CM | POA: Insufficient documentation

## 2018-08-08 DIAGNOSIS — E119 Type 2 diabetes mellitus without complications: Secondary | ICD-10-CM | POA: Insufficient documentation

## 2018-08-08 DIAGNOSIS — Z79899 Other long term (current) drug therapy: Secondary | ICD-10-CM | POA: Insufficient documentation

## 2018-08-08 DIAGNOSIS — S52502A Unspecified fracture of the lower end of left radius, initial encounter for closed fracture: Secondary | ICD-10-CM | POA: Insufficient documentation

## 2018-08-08 DIAGNOSIS — S6992XA Unspecified injury of left wrist, hand and finger(s), initial encounter: Secondary | ICD-10-CM | POA: Diagnosis present

## 2018-08-08 DIAGNOSIS — W07XXXA Fall from chair, initial encounter: Secondary | ICD-10-CM | POA: Insufficient documentation

## 2018-08-08 DIAGNOSIS — S4992XA Unspecified injury of left shoulder and upper arm, initial encounter: Secondary | ICD-10-CM | POA: Diagnosis not present

## 2018-08-08 DIAGNOSIS — I447 Left bundle-branch block, unspecified: Secondary | ICD-10-CM | POA: Diagnosis not present

## 2018-08-08 MED ORDER — FENTANYL CITRATE (PF) 100 MCG/2ML IJ SOLN
25.0000 ug | Freq: Once | INTRAMUSCULAR | Status: AC
  Administered 2018-08-08: 16:00:00 25 ug via INTRAVENOUS
  Filled 2018-08-08: qty 2

## 2018-08-08 MED ORDER — FENTANYL CITRATE (PF) 100 MCG/2ML IJ SOLN
50.0000 ug | Freq: Once | INTRAMUSCULAR | Status: AC
  Administered 2018-08-08: 15:00:00 50 ug via INTRAVENOUS
  Filled 2018-08-08: qty 2

## 2018-08-08 NOTE — ED Provider Notes (Signed)
[facility] EMERGENCY DEPARTMENT Provider Note   CSN: [id number] Arrival date & time: 08/08/18  1358    History   Chief Complaint Chief Complaint  Patient presents with  . Fall    HPI Mary Cline is a 85 y.o. female.     HPI Patient presents with a fall.  States she fell while trying to get into a chair but she had difficulty moving.  Complaining of left wrist.  No loss conscious.  Did not hit her head.  There is deformity of the wrist.  She is not on anticoagulation.  No chest pain.  No abdominal pain. Past Medical History:  Diagnosis Date  . Anemia   . Arthritis   . Chronic systolic heart failure ([place]) 04/03/2015  . Dementia ([place])   . Diabetes mellitus   . Essential hypertension 04/03/2015  . GERD (gastroesophageal reflux disease)   . Hernia   . Hyperlipidemia 04/03/2015  . Hypertension   . Macular degeneration disease     Patient Active Problem List   Diagnosis Date Noted  . Protein-calorie malnutrition, severe 06/12/2018  . Volume overload 06/11/2018  . Elevated troponin I level 06/10/2018  . Recurrent abdominal pain 06/10/2018  . Hiatal hernia 06/10/2018  . Acute coronary syndrome ([place]) 06/10/2018  . Acute on chronic systolic and diastolic heart failure, NYHA class 2 ([place]) 06/10/2018  . Dupuytren's contracture of both hands 05/26/2016  . Chronic systolic heart failure ([place]) 04/03/2015  . Essential hypertension 04/03/2015  . Hyperlipidemia 04/03/2015  . Retinal vascular occlusion, unspecified 11/10/2012  . Giant cell arteritis ([place]) 11/10/2012  . Headache(784.0) 11/08/2012  . Visual changes 11/08/2012    Past Surgical History:  Procedure Laterality Date  . ANGIOPLASTY    . APPENDECTOMY    . HYSTEROTOMY       OB History   No obstetric history on file.      Home Medications    Prior to Admission medications   Medication Sig Start Date End Date Taking? Authorizing Provider  aspirin 81 MG tablet Take 81 mg by mouth daily.     [provider]  carvedilol (COREG) 6.25 MG tablet Take 1 tablet (6.25 mg total) by mouth 2 (two) times daily. 01/11/18   Skeet Latch, MD  Cyanocobalamin (VITAMIN B 12) 500 MCG TABS Take 500 mcg by mouth every Monday, Wednesday, and Friday.     [provider]  ezetimibe (ZETIA) 10 MG tablet Take 10 mg by mouth daily.    [provider]  furosemide (LASIX) 20 MG tablet Take 1 tablet (20 mg total) by mouth as needed. For weight gain more that 2 pounds in a day or 5 pounds in a week. 10/09/16   Skeet Latch, MD  glycerin adult 2 G SUPP Place 1 suppository rectally once as needed for moderate constipation.    [provider]  ibandronate (BONIVA) 150 MG tablet Take 150 mg by mouth every 30 (thirty) days. Take in the morning with a full glass of water, on an empty stomach, and do not take anything else by mouth or lie down for the next 30 min.    [provider]  irbesartan (AVAPRO) 150 MG tablet Take 1 tablet (150 mg total) daily by mouth. 02/16/17   Skeet Latch, MD  Multiple Vitamin (MULTIVITAMIN) tablet Take 1 tablet by mouth daily.    [provider]  NONFORMULARY OR COMPOUNDED ITEM Apply 1 application topically as needed (apply to bottom area). Ketoconazole and fluticasone compounded cream.  Apply as needed to affected areas as needed as direct    [provider]  omeprazole (PRILOSEC) 40 MG capsule Take 40 mg by mouth daily.    [provider]  ondansetron (ZOFRAN) 4 MG tablet Take 1 tablet (4 mg total) by mouth every 8 (eight) hours as needed for nausea or vomiting. 06/10/18   Hayden Rasmussen, MD  spironolactone (ALDACTONE) 25 MG tablet TAKE 1/2 TABLET EVERY DAY 07/31/18   Skeet Latch, MD  sucralfate (CARAFATE) 1 g tablet Take 1 tablet (1 g total) by mouth 4 (four) times daily -  with meals and at bedtime. 06/10/18   Hayden Rasmussen, MD  traMADol (ULTRAM) 50 MG tablet Take 1 tablet (50 mg total) by mouth 3  (three) times daily as needed for severe pain. 09/04/14   Carmin Muskrat, MD    Family History Family History  Problem Relation Age of Onset  . Cancer Sister        Breast cancer  . Diabetes Brother   . Diabetes Maternal Grandmother   . Heart Problems Maternal Grandfather   . Heart Problems Paternal Grandmother   . Heart Problems Paternal Grandfather   . Diabetes Brother   . Diabetes Brother   . Heart Problems Daughter   . Heart Problems Daughter     Social History Social History   Tobacco Use  . Smoking status: Never Smoker  . Smokeless tobacco: Never Used  Substance Use Topics  . Alcohol use: No  . Drug use: No     Allergies   Fish allergy; Iodine; Actonel [risedronate sodium]; Atorvastatin; Ciprofloxacin; Codeine; Corticotropin; Demeclocycline; Dihydrotachysterol; Garamycin [gentamicin sulfate]; Lactose intolerance (gi); Lactulose; Lovastatin; Macrobid [nitrofurantoin monohyd macro]; Meperidine; Other; Paxil [paroxetine hcl]; Penicillins; Rofecoxib; Sulfa antibiotics; Tetracyclines & related; and Vitamin d analogs   Review of Systems Review of Systems  Constitutional: Negative for appetite change.  HENT: Negative for congestion.   Cardiovascular: Negative for chest pain.  Gastrointestinal: Negative for abdominal pain.  Genitourinary: Negative for flank pain.  Musculoskeletal:       Left wrist pain.  Skin: Negative for wound.  Neurological: Negative for weakness.  Psychiatric/Behavioral: Negative for confusion.     Physical Exam Updated Vital Signs BP 132/76 (BP Location: Right Arm)   Pulse 80   Temp 98.7 F (37.1 C) (Oral)   Resp 18   Ht 5\' 1"  (1.549 m)   Wt 45.4 kg   SpO2 100%   BMI 18.89 kg/m   Physical Exam Vitals signs and nursing note reviewed.  HENT:     Head: Atraumatic.  Neck:     Musculoskeletal: Neck supple.  Cardiovascular:     Rate and Rhythm: Normal rate and regular rhythm.  Pulmonary:     Effort: Pulmonary effort is normal.   Abdominal:     Tenderness: There is no abdominal tenderness.  Musculoskeletal:     Comments: Tenderness and deformity of left wrist.  Both on the radial and ulnar side.  Some ecchymosis going up the dorsal aspect of the hand.  No tenderness over elbow or shoulder however.  Radial pulse intact.  Skin:    Capillary Refill: Capillary refill takes less than 2 seconds.  Neurological:     Mental Status: She is alert.      ED Treatments / Results  Labs (all labs ordered are listed, but only abnormal results are displayed) Labs Reviewed - No data to display  EKG None  Radiology No results found.  Procedures Procedures (including critical  care time)  Medications Ordered in ED Medications  fentaNYL (SUBLIMAZE) injection 50 mcg (has no administration in time range)     Initial Impression / Assessment and Plan / ED Course  I have reviewed the triage vital signs and the nursing notes.  Pertinent labs & imaging results that were available during my care of the patient were reviewed by me and considered in my medical decision making (see chart for details).        Patient with fall.  Mechanical.  Likely distal radius fracture.  Discussed with patient's family.  Care will be turned over to Dr. Sherry Ruffing.  Final Clinical Impressions(s) / ED Diagnoses   Final diagnoses:  None    ED Discharge Orders    None       Davonna Belling, MD 08/08/18 1450

## 2018-08-08 NOTE — ED Notes (Signed)
Patient transported to X-ray 

## 2018-08-08 NOTE — ED Provider Notes (Signed)
Care assumed from Dr. Alvino Chapel.  At time of transfer care, patient is awaiting results of x-rays of shoulder and wrist after the mechanical fall.  Patient had no evidence of fracture on her shoulder x-ray but there was degenerative arthritis.  Wrist x-ray confirmed a distal radius fracture with some volar angulation.  Spoke with Dr. Libby Maw with orthopedics hand surgery who recommended placement of finger traps and then a sugar tong splint.  She will then follow-up with him in clinic next week.  He agreed that she did not need a sedation and reduction at this time.  Spine will be placed and family will be contacted.  Anticipate discharge after management.  6:00 PM Just book with Tomi Bamberger, patient's daughter and informed of plan.  Patient had the splint applied without significant difficulty after finger traps.  The wrist angulation appeared improved after finger traps.  Patient was feeling better as far as pain.  Patient's fingers were reassessed and she has normal sensation and can wiggle all of her fingers.  Patient will follow up with to [facility] with orthopedics next week and family was informed of this plan as well.  Patient understands return precautions and follow-up instructions.  Patient will be discharged to family.   Clinical Impression: 1. Closed fracture of distal end of left radius, unspecified fracture morphology, initial encounter   2. Fall, initial encounter     Disposition: Discharge  Condition: Good  I have discussed the results, Dx and Tx plan with the pt(& family if present). He/she/they expressed understanding and agree(s) with the plan. Discharge instructions discussed at great length. Strict return precautions discussed and pt &/or family have verbalized understanding of the instructions. No further questions at time of discharge.    New Prescriptions   No medications on file    Follow Up: Renette Butters, MD [address] [place]  [address] [phone]  Schedule an appointment as soon as possible for a visit  Schedule an appointment for next week with Dr. Percell Miller with orthopedics.  [place] [address] [phone] mc [place]         Leonides Minder, Gwenyth Allegra, MD 08/08/18 [phone]

## 2018-08-08 NOTE — Progress Notes (Signed)
Orthopedic Tech Progress Note Patient Details:  Mary Cline 1922-09-20 [id number] RN paged requesting a sugartong splint and the finger trap to be used for 5 minutes then apply the splint. She did very well.  Ortho Devices Type of Ortho Device: Sugartong splint Ortho Device/Splint Location: ULE Ortho Device/Splint Interventions: Adjustment, Application, Ordered   Post Interventions Patient Tolerated: Well Instructions Provided: Care of device, Adjustment of device   Janit Pagan 08/08/2018, 5:11 PM

## 2018-08-08 NOTE — ED Triage Notes (Signed)
Mechanical fall while reaching for her walker. Left wrist swelling and bruising. C/o pain 9/10 to left wrist radiating up left arm. A/O x 4, from home. 2 daughters brought patient in [place] and would like to be updated Tomi Bamberger [phone]) Vaughan Basta [phone])

## 2018-08-08 NOTE — Discharge Instructions (Signed)
Your fall today led to you sustaining a fracture to your left distal radius.  This caused the wrist pain and deformity.  We were able to have it straightened out with the finger traps and then it was splinted.  Please follow-up with Dr. Mackey Birchwood with orthopedics next week.  Please get in contact with his office to schedule the appointment.  Please keep the arm elevated.  If any symptoms change or worsen, is return to the nearest emergency department.

## 2018-08-16 DIAGNOSIS — M25532 Pain in left wrist: Secondary | ICD-10-CM | POA: Diagnosis not present

## 2018-08-17 ENCOUNTER — Ambulatory Visit: Payer: Medicare PPO | Admitting: Sports Medicine

## 2018-08-17 ENCOUNTER — Encounter: Payer: Self-pay | Admitting: Sports Medicine

## 2018-08-17 ENCOUNTER — Other Ambulatory Visit: Payer: Self-pay

## 2018-08-17 VITALS — Temp 98.1°F

## 2018-08-17 DIAGNOSIS — M79675 Pain in left toe(s): Secondary | ICD-10-CM | POA: Diagnosis not present

## 2018-08-17 DIAGNOSIS — Z7901 Long term (current) use of anticoagulants: Secondary | ICD-10-CM

## 2018-08-17 DIAGNOSIS — B351 Tinea unguium: Secondary | ICD-10-CM

## 2018-08-17 DIAGNOSIS — G629 Polyneuropathy, unspecified: Secondary | ICD-10-CM

## 2018-08-17 DIAGNOSIS — R7303 Prediabetes: Secondary | ICD-10-CM

## 2018-08-17 DIAGNOSIS — M79674 Pain in right toe(s): Secondary | ICD-10-CM

## 2018-08-17 NOTE — Progress Notes (Signed)
Patient ID: Mary Cline, female   DOB: 12/14/1922, 85 y.o.   MRN: [id number]  Subjective: Mary Cline is a 85 y.o. female patient seen today in office with complaint of painful thickened and elongated toenails; unable to trim. Patient denies any changes since last visit with medical problems. Reports that she broke her arm since last visit.  Patient is assisted by daughter this visit.  No other issues noted.  Patient Active Problem List   Diagnosis Date Noted  . Protein-calorie malnutrition, severe 06/12/2018  . Volume overload 06/11/2018  . Elevated troponin I level 06/10/2018  . Recurrent abdominal pain 06/10/2018  . Hiatal hernia 06/10/2018  . Acute coronary syndrome ([place]) 06/10/2018  . Acute on chronic systolic and diastolic heart failure, NYHA class 2 ([place]) 06/10/2018  . Dupuytren's contracture of both hands 05/26/2016  . Chronic systolic heart failure ([place]) 04/03/2015  . Essential hypertension 04/03/2015  . Hyperlipidemia 04/03/2015  . Retinal vascular occlusion, unspecified 11/10/2012  . Giant cell arteritis ([place]) 11/10/2012  . Headache(784.0) 11/08/2012  . Visual changes 11/08/2012    Current Outpatient Medications on File Prior to Visit  Medication Sig Dispense Refill  . aspirin 81 MG tablet Take 81 mg by mouth daily.    . carvedilol (COREG) 6.25 MG tablet Take 1 tablet (6.25 mg total) by mouth 2 (two) times daily. 180 tablet 3  . Cyanocobalamin (VITAMIN B 12) 500 MCG TABS Take 500 mcg by mouth every Monday, Wednesday, and Friday.     . ezetimibe (ZETIA) 10 MG tablet Take 10 mg by mouth daily.    . furosemide (LASIX) 20 MG tablet Take 1 tablet (20 mg total) by mouth as needed. For weight gain more that 2 pounds in a day or 5 pounds in a week. 90 tablet 3  . glycerin adult 2 G SUPP Place 1 suppository rectally once as needed for moderate constipation.    . ibandronate (BONIVA) 150 MG tablet Take 150 mg by mouth every 30 (thirty) days. Take in the morning with a  full glass of water, on an empty stomach, and do not take anything else by mouth or lie down for the next 30 min.    . irbesartan (AVAPRO) 150 MG tablet Take 1 tablet (150 mg total) daily by mouth. 90 tablet 3  . Multiple Vitamin (MULTIVITAMIN) tablet Take 1 tablet by mouth daily.    . NONFORMULARY OR COMPOUNDED ITEM Apply 1 application topically as needed (apply to bottom area). Ketoconazole and fluticasone compounded cream.  Apply as needed to affected areas as needed as direct    . omeprazole (PRILOSEC) 40 MG capsule Take 40 mg by mouth daily.    . ondansetron (ZOFRAN) 4 MG tablet Take 1 tablet (4 mg total) by mouth every 8 (eight) hours as needed for nausea or vomiting. 15 tablet 0  . spironolactone (ALDACTONE) 25 MG tablet TAKE 1/2 TABLET EVERY DAY 45 tablet 0  . sucralfate (CARAFATE) 1 g tablet Take 1 tablet (1 g total) by mouth 4 (four) times daily -  with meals and at bedtime. 40 tablet 0  . traMADol (ULTRAM) 50 MG tablet Take 1 tablet (50 mg total) by mouth 3 (three) times daily as needed for severe pain. 20 tablet 0  . Vitamin D, Ergocalciferol, (DRISDOL) 1.25 MG (50000 UT) CAPS capsule TAKE 1 CAPSULE BY MOUTH ONCE A WEEK FOR 90 DAYS     No current facility-administered medications on file prior to visit.     Allergies  Allergen  Reactions  . Fish Allergy Anaphylaxis  . Iodine Anaphylaxis  . Actonel [Risedronate Sodium] Nausea And Vomiting  . Atorvastatin Other (See Comments)    Blisters in mouth.   Blisters in mouth.    . Ciprofloxacin Nausea And Vomiting  . Codeine Other (See Comments)    Headache  Headache  . Corticotropin Other (See Comments)    Confusion  Confusion  . Demeclocycline     Stomach pain  . Dihydrotachysterol     Dizzy confused  . Garamycin [Gentamicin Sulfate] Rash  . Lactose Intolerance (Gi) Other (See Comments)    Intolerance.    . Lactulose     Intolerance.    . Lovastatin Other (See Comments)    blisters blisters  . Macrobid [Nitrofurantoin  Monohyd Macro] Nausea And Vomiting  . Meperidine Other (See Comments)    Hypotension  Hypotension  . Other Hives and Swelling    Melons and strawberries.    . Paxil [Paroxetine Hcl] Nausea Only  . Penicillins Other (See Comments)    Did it involve swelling of the face/tongue/throat, SOB, or low BP? No Did it involve sudden or severe rash/hives, skin peeling, or any reaction on the inside of your mouth or nose? Unknown Did you need to seek medical attention at a hospital or doctor's office? Unknown When did it last happen?Childhood If all above answers are "NO", may proceed with cephalosporin use. Pain.    . Rofecoxib Other (See Comments)    Not known Not known  . Sulfa Antibiotics Swelling  . Tetracyclines & Related Other (See Comments)    Stomach pain  . Vitamin D Analogs Other (See Comments)    Dizzy confused    Objective: Physical Exam  General: Well developed, nourished, no acute distress, awake, alert and oriented x 3 hard of hearing  Vascular: Dorsalis pedis artery 1/4 bilateral, Posterior tibial artery 0/4 bilateral, skin temperature warm to warm proximal to distal bilateral lower extremities, mild varicosities, decreased pedal hair present bilateral.  Neurological: Gross sensation present via light touch bilateral. Subjective tingling to toes, likely positional neuropathy  Dermatological: Skin is warm, dry, and supple bilateral, Nails 1-10 are tender, long, thick, and discolored with mild subungal debris, no webspace macerations present bilateral, no open lesions present bilateral, no callus/corns/hyperkeratotic tissue present bilateral. No signs of infection bilateral.  Musculoskeletal: Bunion deformities noted bilateral and lesser hammertoe that is asymptomatic. Muscular strength within normal limits without pain on range of motion. No pain with calf compression bilateral.  Assessment and Plan:  Problem List Items Addressed This Visit    None    Visit  Diagnoses    Dermatophytosis of nail    -  Primary   Pre-diabetes       Neuropathy       Toe pain, bilateral       Long term (current) use of anticoagulants         -Examined patient.  -Discussed treatment options for painful mycotic nails. -Mechanically debrided and reduced mycotic nails with sterile nail nipper and dremel nail file without incident. -Dispensed new toe spacers and advised rest from spacers at bedtime to prevent irritation in between 1st toes  -Patient to return in 2.5 to 3 months for follow up evaluation or sooner if symptoms worsen.  [name], DPM

## 2018-08-24 ENCOUNTER — Telehealth: Payer: Medicare PPO | Admitting: Internal Medicine

## 2018-08-24 DIAGNOSIS — F320 Major depressive disorder, single episode, mild: Secondary | ICD-10-CM | POA: Diagnosis not present

## 2018-08-24 DIAGNOSIS — R634 Abnormal weight loss: Secondary | ICD-10-CM | POA: Diagnosis not present

## 2018-08-25 ENCOUNTER — Telehealth: Payer: Self-pay | Admitting: Cardiovascular Disease

## 2018-08-25 NOTE — Telephone Encounter (Signed)
Call daughter's smartphone [phone] my chart/ consent/ pre reg completed

## 2018-08-25 NOTE — Telephone Encounter (Signed)
° ° °  Patients daughter is returning a call regarding 5/28 appointment, unsure who called. No note in Epic

## 2018-08-26 ENCOUNTER — Telehealth (INDEPENDENT_AMBULATORY_CARE_PROVIDER_SITE_OTHER): Payer: Medicare PPO | Admitting: Cardiovascular Disease

## 2018-08-26 VITALS — BP 121/62 | HR 65 | Ht <= 58 in | Wt 89.0 lb

## 2018-08-26 DIAGNOSIS — I10 Essential (primary) hypertension: Secondary | ICD-10-CM | POA: Diagnosis not present

## 2018-08-26 DIAGNOSIS — Z5181 Encounter for therapeutic drug level monitoring: Secondary | ICD-10-CM

## 2018-08-26 DIAGNOSIS — R627 Adult failure to thrive: Secondary | ICD-10-CM | POA: Diagnosis not present

## 2018-08-26 DIAGNOSIS — I5022 Chronic systolic (congestive) heart failure: Secondary | ICD-10-CM | POA: Diagnosis not present

## 2018-08-26 DIAGNOSIS — R6251 Failure to thrive (child): Secondary | ICD-10-CM

## 2018-08-26 NOTE — Patient Instructions (Addendum)
Medication Instructions:  Your physician recommends that you continue on your current medications as directed. Please refer to the Current Medication list given to you today.  If you need a refill on your cardiac medications before your next appointment, please call your pharmacy.   Lab work: BMET WHEN ABLE  If you have labs (blood work) drawn today and your tests are completely normal, you will receive your results only by: Marland Kitchen MyChart Message (if you have MyChart) OR . A paper copy in the mail If you have any lab test that is abnormal or we need to change your treatment, we will call you to review the results.  Testing/Procedures: NONE  Follow-Up: At [facility], you and your health needs are our priority.  As part of our continuing mission to provide you with exceptional heart care, we have created designated Provider Care Teams.  These Care Teams include your primary Cardiologist (physician) and Advanced Practice Providers (APPs -  Physician Assistants and Nurse Practitioners) who all work together to provide you with the care you need, when you need it. You will need a follow up appointment in 4 months.  Please call our office 2 months in advance to schedule this appointment.  You may see Skeet Latch, MD or one of the following Advanced Practice Providers on your designated Care Team:   Kerin Ransom, PA-C Roby Lofts, [place] . Sande Rives, PA-C

## 2018-08-26 NOTE — Progress Notes (Signed)
Virtual Visit via Video Note    Evaluation Performed:  Follow-up visit  This visit type was conducted due to national recommendations for restrictions regarding the COVID-19 Pandemic (e.g. social distancing).  This format is felt to be most appropriate for this patient at this time.  All issues noted in this document were discussed and addressed.  No physical exam was performed (except for noted visual exam findings with Video Visits).  Please refer to the patient's chart (MyChart message for video visits and phone note for telephone visits) for the patient's consent to telehealth for [facility].  Date:  08/26/2018   ID:  Mary Cline, DOB January 30, 1923, MRN [id number]  Patient Location:  [place] [address]   Provider location:   [address] [place], [place]  PCP:  Rankins, Bill Salinas, MD  Cardiologist:  Skeet Latch, MD  Electrophysiologist:  None   Chief Complaint:  Hospital follow up  History of Present Illness:    Mary Cline is a 85 y.o. female with hypertension, diabetes, GERD, giant cell arteritis, CLL, chronic systolic heart failure LVEF 15-20%, and LBBB who presents via video conferencing for a telehealth visit today.   Mary Cline was seen by [name] (cardiolgist, [facility]) on 11/2014.  At that time she was following up after a hospitalization for systolic heart failure that was likely ischemic.  She was initially hospitalized for shortness of breath and was found to have an NSTEMI.  Troponin peaked at 0.17.  BNP was 33,000. EKG revealed LBBB and no prior EKGs were available for comparison.  Her symptoms improved with diuresis.   Her family decided not to pursue aggressive measures given her age. Mary Cline has been unable to get financial assistance to afford Entresto.  She was switched to irbesartan.  Carvedilol was increased due to poorly controlled BP.   Mary Cline was admitted to the hospital  05/2018 with a heart failure exacerbation.  Her presenting symptom was abdominal discomfort and nausea.  She had minimal shortness of breath and no edema.  She was diuresed with IV lasix.  Troponin was mildly elevated and thought to be attributable to demand ischemia.  Since leaving the hospital her weight has been stable between 90-92 lb.  At her follow up appointment she was stable.  Since that time she suffered a fall and fractured her distal radius.  She was trying to get out of the chair and slipped and fell.  Her breathing has been stable.  She denies any lower extremity edmea, orthopnea or PND.  After her fall she struggled with lack of appetite and nausea.  She thinks this was due to the tramadol she has been taking for pain.  She saw her PCP last week and was started on Remeron and Zofran.  Since then she is doing much better.  She is starting to gain weight back and is sleeping well.  She has no complaints today.  She did decide that she is not interested in having a CRT device at this time.  The patient does not symptoms concerning for COVID-19 infection (fever, chills, cough, or new SHORTNESS OF BREATH).    Prior CV studies:   The following studies were reviewed today:  Echo 06/11/18: IMPRESSIONS    1. The left ventricle has severely reduced systolic function, with an ejection fraction of 20-25%. The cavity size was mildly dilated. Left ventricular diastolic Doppler parameters are consistent with impaired relaxation. Left ventricular diffuse  hypokinesis.  2. The right ventricle has normal systolic function. The cavity was normal. There is no increase in right ventricular wall thickness.  3. Left atrial size was moderately dilated.  4. Trivial pericardial effusion is present.  5. Mild thickening of the mitral valve leaflet. Mild calcification of the mitral valve leaflet. Mitral valve regurgitation is moderate by color flow Doppler. Eccentric, anteriorly-directed mitral regurgitation with  restriction of posterior mitral  leaflet. No evidence of mitral valve stenosis.  6. The aortic valve is tricuspid Mild calcification of the aortic valve. no stenosis of the aortic valve.  7. The ascending aorta and aortic root are normal in size and structure.  8. Normal IVC size. No complete TR doppler jet so unable to estimate PA systolic pressure. 8  Past Medical History:  Diagnosis Date  . Anemia   . Arthritis   . Chronic systolic heart failure ([place]) 04/03/2015  . Dementia ([place])   . Diabetes mellitus   . Essential hypertension 04/03/2015  . GERD (gastroesophageal reflux disease)   . Hernia   . Hyperlipidemia 04/03/2015  . Hypertension   . Macular degeneration disease    Past Surgical History:  Procedure Laterality Date  . ANGIOPLASTY    . APPENDECTOMY    . HYSTEROTOMY       Current Meds  Medication Sig  . aspirin 81 MG tablet Take 81 mg by mouth daily.  . carvedilol (COREG) 6.25 MG tablet Take 1 tablet (6.25 mg total) by mouth 2 (two) times daily.  . Cyanocobalamin (VITAMIN B 12) 500 MCG TABS Take 500 mcg by mouth every Monday, Wednesday, and Friday.   . ezetimibe (ZETIA) 10 MG tablet Take 10 mg by mouth daily.  . furosemide (LASIX) 20 MG tablet Take 1 tablet (20 mg total) by mouth as needed. For weight gain more that 2 pounds in a day or 5 pounds in a week.  Marland Kitchen glycerin adult 2 G SUPP Place 1 suppository rectally once as needed for moderate constipation.  . ibandronate (BONIVA) 150 MG tablet Take 150 mg by mouth every 30 (thirty) days. Take in the morning with a full glass of water, on an empty stomach, and do not take anything else by mouth or lie down for the next 30 min.  . irbesartan (AVAPRO) 150 MG tablet Take 1 tablet (150 mg total) daily by mouth.  . mirtazapine (REMERON) 15 MG tablet TAKE 1 TABLET BY MOUTH ONCE DAILY AT BEDTIME FOR 30 DAYS  . Multiple Vitamin (MULTIVITAMIN) tablet Take 1 tablet by mouth daily.  . NONFORMULARY OR COMPOUNDED ITEM Apply 1 application  topically as needed (apply to bottom area). Ketoconazole and fluticasone compounded cream.  Apply as needed to affected areas as needed as direct  . omeprazole (PRILOSEC) 40 MG capsule Take 40 mg by mouth daily.  . ondansetron (ZOFRAN) 4 MG tablet Take 1 tablet (4 mg total) by mouth every 8 (eight) hours as needed for nausea or vomiting.  Marland Kitchen spironolactone (ALDACTONE) 25 MG tablet TAKE 1/2 TABLET EVERY DAY  . sucralfate (CARAFATE) 1 g tablet Take 1 tablet (1 g total) by mouth 4 (four) times daily -  with meals and at bedtime.  . traMADol (ULTRAM) 50 MG tablet Take 1 tablet (50 mg total) by mouth 3 (three) times daily as needed for severe pain.  . Vitamin D, Ergocalciferol, (DRISDOL) 1.25 MG (50000 UT) CAPS capsule TAKE 1 CAPSULE BY MOUTH ONCE A WEEK FOR 90 DAYS     Allergies:   Fish allergy;  Iodine; Actonel [risedronate sodium]; Atorvastatin; Ciprofloxacin; Codeine; Corticotropin; Demeclocycline; Dihydrotachysterol; Garamycin [gentamicin sulfate]; Lactose intolerance (gi); Lactulose; Lovastatin; Macrobid [nitrofurantoin monohyd macro]; Meperidine; Other; Paxil [paroxetine hcl]; Penicillins; Rofecoxib; Sulfa antibiotics; Tetracyclines & related; and Vitamin d analogs   Social History   Tobacco Use  . Smoking status: Never Smoker  . Smokeless tobacco: Never Used  Substance Use Topics  . Alcohol use: No  . Drug use: No     Family Hx: The patient's family history includes Cancer in her sister; Diabetes in her brother, brother, brother, and maternal grandmother; Heart Problems in her daughter, daughter, maternal grandfather, paternal grandfather, and paternal grandmother.  ROS:   Please see the history of present illness.     All other systems reviewed and are negative.   Labs/Other Tests and Data Reviewed:    Recent Labs: 06/10/2018: ALT 17 06/11/2018: B Natriuretic Peptide 3,675.2; Hemoglobin 10.4; Platelets 125 06/13/2018: BUN 26; Creatinine, Ser 1.10; Potassium 4.2; Sodium 137    Recent Lipid Panel Lab Results  Component Value Date/Time   CHOL 119 06/11/2018 05:39 AM   CHOL 138 08/11/2017 10:28 AM   TRIG 47 06/11/2018 05:39 AM   HDL 41 06/11/2018 05:39 AM   HDL 53 08/11/2017 10:28 AM   CHOLHDL 2.9 06/11/2018 05:39 AM   LDLCALC 69 06/11/2018 05:39 AM   LDLCALC 71 08/11/2017 10:28 AM    Wt Readings from Last 3 Encounters:  08/26/18 89 lb (40.4 kg)  08/08/18 100 lb (45.4 kg)  06/24/18 92 lb (41.7 kg)     Exam:    BP 121/62   Pulse 65   Ht 4\' 9"  (1.448 m)   Wt 89 lb (40.4 kg)   BMI 19.26 kg/m  GENERAL: Well-appearing elderly woman in no acute distress. HEENT: Pupils equal round.  Oral mucosa unremarkable NECK:  No jugular venous distention, no visible thyromegaly EXT:  No edema, no cyanosis no clubbing SKIN:  No rashes no nodules NEURO:  Speech fluent.  Cranial nerves grossly intact.  Moves all 4 extremities freely PSYCH:  Cognitively intact, oriented to person place and time   ASSESSMENT & PLAN:     # Chronic systolic heart failure: # Hypertension:  Mary Cline is stable from a heart failure standpoint.  She is euvolemic and feeling well.  She lost 10 pounds when her appetite was down.  We will check a BMP to ensure that her renal function and potassium are stable.  We did discuss CRT and she was referred to Dr. Caryl Comes.  If her she was interested.  However after discussion with her family and her relative failure to thrive she is not interested at this time. She has been treated for multiple heart failure hospitalizations.  Unfortunately she hasn't been able to get on Entresto 2/2 cost.  Continue carvedilol, irbesartan, spironolactone, and prn furosemide.  # CAD s/p NSTEMI: Mary Cline had a mild NSTEMI 11/2014 that has been medically managed.  Troponin was elevated to 1.07 in the hospital 05/2018 but it was felt to be due to demand ischemia.  She has not experienced any chest pain.   Continue aspirin, carvedilol and Zetia.  She hasn't tolerated  statins and no plans for PCSK9 inhibitor 2/2 age.   COVID-19 Education: The signs and symptoms of COVID-19 were discussed with the patient and how to seek care for testing (follow up with PCP or arrange E-visit).  The importance of social distancing was discussed today.  Patient Risk:   After full review of this patients clinical  status, I feel that they are at least moderate risk at this time.  Time:   Today, I have spent 15 minutes with the patient with telehealth technology discussing heart failure, CRT-P.     Medication Adjustments/Labs and Tests Ordered: Current medicines are reviewed at length with the patient today.  Concerns regarding medicines are outlined above.  Tests Ordered: Orders Placed This Encounter  Procedures  . Basic metabolic panel   Medication Changes: No orders of the defined types were placed in this encounter.   Disposition: f/u in 4 months  Signed, Skeet Latch, MD  08/26/2018 12:32 PM    [place]

## 2018-09-22 DIAGNOSIS — M25532 Pain in left wrist: Secondary | ICD-10-CM | POA: Diagnosis not present

## 2018-11-03 ENCOUNTER — Other Ambulatory Visit: Payer: Self-pay | Admitting: Cardiovascular Disease

## 2018-11-16 ENCOUNTER — Ambulatory Visit: Payer: Medicare PPO | Admitting: Sports Medicine

## 2018-11-16 ENCOUNTER — Other Ambulatory Visit: Payer: Self-pay

## 2018-11-16 ENCOUNTER — Encounter: Payer: Self-pay | Admitting: Sports Medicine

## 2018-11-16 VITALS — Temp 97.7°F

## 2018-11-16 DIAGNOSIS — M79675 Pain in left toe(s): Secondary | ICD-10-CM

## 2018-11-16 DIAGNOSIS — Z7901 Long term (current) use of anticoagulants: Secondary | ICD-10-CM

## 2018-11-16 DIAGNOSIS — M79674 Pain in right toe(s): Secondary | ICD-10-CM

## 2018-11-16 DIAGNOSIS — B351 Tinea unguium: Secondary | ICD-10-CM | POA: Diagnosis not present

## 2018-11-16 DIAGNOSIS — R600 Localized edema: Secondary | ICD-10-CM

## 2018-11-16 NOTE — Progress Notes (Signed)
Patient ID: Mary Cline, female   DOB: [birth year], 85 y.o.   MRN: [id number]  Subjective: Mary Cline is a 85 y.o. female patient seen today in office with complaint of painful thickened and elongated toenails; unable to trim. Patient denies any changes since last visit with medical problems except a increase in swelling in both feet that has started over the last several weeks.  Patient is assisted by daughter this visit who helps to report this history.  No other issues noted.  Patient Active Problem List   Diagnosis Date Noted  . Protein-calorie malnutrition, severe 06/12/2018  . Volume overload 06/11/2018  . Elevated troponin I level 06/10/2018  . Recurrent abdominal pain 06/10/2018  . Hiatal hernia 06/10/2018  . Acute coronary syndrome ([place]) 06/10/2018  . Acute on chronic systolic and diastolic heart failure, NYHA class 2 ([place]) 06/10/2018  . Dupuytren's contracture of both hands 05/26/2016  . Chronic systolic heart failure ([place]) 04/03/2015  . Essential hypertension 04/03/2015  . Hyperlipidemia 04/03/2015  . Retinal vascular occlusion, unspecified 11/10/2012  . Giant cell arteritis ([place]) 11/10/2012  . Headache(784.0) 11/08/2012  . Visual changes 11/08/2012    Current Outpatient Medications on File Prior to Visit  Medication Sig Dispense Refill  . aspirin 81 MG tablet Take 81 mg by mouth daily.    . carvedilol (COREG) 6.25 MG tablet TAKE 1 TABLET TWICE DAILY 180 tablet 3  . Cyanocobalamin (VITAMIN B 12) 500 MCG TABS Take 500 mcg by mouth every Monday, Wednesday, and Friday.     . ezetimibe (ZETIA) 10 MG tablet Take 10 mg by mouth daily.    . furosemide (LASIX) 20 MG tablet Take 1 tablet (20 mg total) by mouth as needed. For weight gain more that 2 pounds in a day or 5 pounds in a week. 90 tablet 3  . glycerin adult 2 G SUPP Place 1 suppository rectally once as needed for moderate constipation.    . ibandronate (BONIVA) 150 MG tablet Take 150 mg by mouth every 30  (thirty) days. Take in the morning with a full glass of water, on an empty stomach, and do not take anything else by mouth or lie down for the next 30 min.    . irbesartan (AVAPRO) 150 MG tablet Take 1 tablet (150 mg total) daily by mouth. 90 tablet 3  . mirtazapine (REMERON) 15 MG tablet TAKE 1 TABLET BY MOUTH ONCE DAILY AT BEDTIME FOR 30 DAYS    . Multiple Vitamin (MULTIVITAMIN) tablet Take 1 tablet by mouth daily.    . NONFORMULARY OR COMPOUNDED ITEM Apply 1 application topically as needed (apply to bottom area). Ketoconazole and fluticasone compounded cream.  Apply as needed to affected areas as needed as direct    . omeprazole (PRILOSEC) 40 MG capsule Take 40 mg by mouth daily.    . ondansetron (ZOFRAN) 4 MG tablet Take 1 tablet (4 mg total) by mouth every 8 (eight) hours as needed for nausea or vomiting. 15 tablet 0  . spironolactone (ALDACTONE) 25 MG tablet TAKE 1/2 TABLET EVERY DAY 45 tablet 0  . sucralfate (CARAFATE) 1 g tablet Take 1 tablet (1 g total) by mouth 4 (four) times daily -  with meals and at bedtime. 40 tablet 0  . traMADol (ULTRAM) 50 MG tablet Take 1 tablet (50 mg total) by mouth 3 (three) times daily as needed for severe pain. 20 tablet 0  . Vitamin D, Ergocalciferol, (DRISDOL) 1.25 MG (50000 UT) CAPS capsule TAKE 1  CAPSULE BY MOUTH ONCE A WEEK FOR 90 DAYS     No current facility-administered medications on file prior to visit.     Allergies  Allergen Reactions  . Fish Allergy Anaphylaxis  . Iodine Anaphylaxis  . Actonel [Risedronate Sodium] Nausea And Vomiting  . Atorvastatin Other (See Comments)    Blisters in mouth.   Blisters in mouth.    . Ciprofloxacin Nausea And Vomiting  . Codeine Other (See Comments)    Headache  Headache  . Corticotropin Other (See Comments)    Confusion  Confusion  . Demeclocycline     Stomach pain  . Dihydrotachysterol     Dizzy confused  . Garamycin [Gentamicin Sulfate] Rash  . Lactose Intolerance (Gi) Other (See Comments)     Intolerance.    . Lactulose     Intolerance.    . Lovastatin Other (See Comments)    blisters blisters  . Macrobid [Nitrofurantoin Monohyd Macro] Nausea And Vomiting  . Meperidine Other (See Comments)    Hypotension  Hypotension  . Other Hives and Swelling    Melons and strawberries.    . Paxil [Paroxetine Hcl] Nausea Only  . Penicillins Other (See Comments)    Did it involve swelling of the face/tongue/throat, SOB, or low BP? No Did it involve sudden or severe rash/hives, skin peeling, or any reaction on the inside of your mouth or nose? Unknown Did you need to seek medical attention at a hospital or doctor's office? Unknown When did it last happen?Childhood If all above answers are "NO", may proceed with cephalosporin use. Pain.    . Rofecoxib Other (See Comments)    Not known Not known  . Sulfa Antibiotics Swelling  . Tetracyclines & Related Other (See Comments)    Stomach pain  . Vitamin D Analogs Other (See Comments)    Dizzy confused    Objective: Physical Exam  General: Well developed, nourished, no acute distress, awake, alert and oriented x 3 hard of hearing  Vascular: Dorsalis pedis artery 1/4 bilateral, Posterior tibial artery 0/4 bilateral, skin temperature warm to warm proximal to distal bilateral lower extremities, mild varicosities, decreased pedal hair present bilateral.  1+ pitting edema bilateral.  Neurological: Gross sensation present via light touch bilateral. Subjective tingling to toes, likely positional neuropathy  Dermatological: Skin is warm, dry, and supple bilateral, Nails 1-10 are tender, long, thick, and discolored with mild subungal debris, no webspace macerations present bilateral, no open lesions present bilateral, no callus/corns/hyperkeratotic tissue present bilateral. No signs of infection bilateral.  Musculoskeletal: Bunion deformities noted bilateral and lesser hammertoe that is asymptomatic. Muscular strength within normal limits  without pain on range of motion. No pain with calf compression bilateral.  Assessment and Plan:  Problem List Items Addressed This Visit    None    Visit Diagnoses    Pain due to onychomycosis of toenails of both feet    -  Primary   Long term (current) use of anticoagulants       Edema of foot         -Examined patient.  -Discussed treatment options for painful mycotic nails. -Mechanically debrided and reduced mycotic nails with sterile nail nipper and dremel nail file without incident. -Advised patient to follow-up with PCP and cardiologist regarding swelling and to continue with elevation and limiting salt intake to avoid accumulation of edema in feet -Patient to return in 2.5 to 3 months for follow up evaluation or sooner if symptoms worsen.  [name], DPM

## 2018-11-22 ENCOUNTER — Telehealth: Payer: Self-pay | Admitting: Cardiovascular Disease

## 2018-11-22 NOTE — Telephone Encounter (Signed)
° ° °  Pt c/o swelling: STAT is pt has developed SOB within 24 hours  1) How much weight have you gained and in what time span?   2) If swelling, where is the swelling located? toes  3) Are you currently taking a fluid pill? yes  4) Are you currently SOB? no  5) Do you have a log of your daily weights (if so, list)? 94 today, Saturday 96  6) Have you gained 3 pounds in a day or 5 pounds in a week?   7) Have you traveled recently? no

## 2018-11-22 NOTE — Telephone Encounter (Signed)
LMTCB

## 2018-11-24 NOTE — Telephone Encounter (Signed)
Called to give the patient's daughter Tomi Bamberger Dr. Blenda Mounts recommendation.lmtcb.

## 2018-11-24 NOTE — Telephone Encounter (Signed)
Spoke with the patient's daughter Mary Cline. Mary Cline is calling to report new bilateral LE edema. The patient's swelling is isolated to the bottom of her feet underneath the toes. The area is not red, discolored, or painful. The patient complains of her feet being cold and numb but this is not new. Her weights have been fluctuating but stay close to her baseline range of 85- 95lbs 8/20 94lbs 8/21 96lbs 8/22 94lbs 8/23 93lbs 8/24 93lbs 8/25 94lbs Patient denies, sob, orthopnea, PND. She does elevate her legs as much as possible. She has not tried compressions stockings. She is taking spironolactone daily and prn lasix as prescribed. She has been usually take the prn lasix 1-2 times weekly. Merrilee Jansky that I will fwd the update to Dr. Oval Linsey and we would call back with her recommendation. Mary Cline is agreeable with the plan and voiced appreciation for the call.

## 2018-11-24 NOTE — Telephone Encounter (Signed)
Take lasix for 3 days in a row and see if this helps.

## 2018-11-24 NOTE — Telephone Encounter (Signed)
° ° °  Daughter returning call to discuss swelling in toes

## 2018-11-25 NOTE — Telephone Encounter (Signed)
Daughter called back, was advised of message from MD They will call back if no changes.

## 2018-11-28 ENCOUNTER — Other Ambulatory Visit: Payer: Self-pay | Admitting: Cardiovascular Disease

## 2018-12-21 DIAGNOSIS — R300 Dysuria: Secondary | ICD-10-CM | POA: Diagnosis not present

## 2019-02-02 ENCOUNTER — Inpatient Hospital Stay (HOSPITAL_COMMUNITY)
Admission: EM | Admit: 2019-02-02 | Discharge: 2019-02-09 | DRG: 871 | Disposition: A | Discharge status: Hospice | Payer: Medicare PPO | Attending: Internal Medicine | Admitting: Internal Medicine

## 2019-02-02 ENCOUNTER — Encounter (HOSPITAL_COMMUNITY): Payer: Self-pay | Admitting: Emergency Medicine

## 2019-02-02 ENCOUNTER — Other Ambulatory Visit: Payer: Self-pay

## 2019-02-02 DIAGNOSIS — A419 Sepsis, unspecified organism: Secondary | ICD-10-CM | POA: Diagnosis present

## 2019-02-02 DIAGNOSIS — R1084 Generalized abdominal pain: Secondary | ICD-10-CM | POA: Diagnosis not present

## 2019-02-02 DIAGNOSIS — D638 Anemia in other chronic diseases classified elsewhere: Secondary | ICD-10-CM | POA: Diagnosis present

## 2019-02-02 DIAGNOSIS — I5022 Chronic systolic (congestive) heart failure: Secondary | ICD-10-CM | POA: Diagnosis not present

## 2019-02-02 DIAGNOSIS — K5732 Diverticulitis of large intestine without perforation or abscess without bleeding: Secondary | ICD-10-CM | POA: Diagnosis not present

## 2019-02-02 DIAGNOSIS — K219 Gastro-esophageal reflux disease without esophagitis: Secondary | ICD-10-CM | POA: Diagnosis present

## 2019-02-02 DIAGNOSIS — R197 Diarrhea, unspecified: Secondary | ICD-10-CM

## 2019-02-02 DIAGNOSIS — Z91011 Allergy to milk products: Secondary | ICD-10-CM

## 2019-02-02 DIAGNOSIS — Z6823 Body mass index (BMI) 23.0-23.9, adult: Secondary | ICD-10-CM

## 2019-02-02 DIAGNOSIS — R0602 Shortness of breath: Secondary | ICD-10-CM | POA: Diagnosis not present

## 2019-02-02 DIAGNOSIS — Z885 Allergy status to narcotic agent status: Secondary | ICD-10-CM

## 2019-02-02 DIAGNOSIS — K5792 Diverticulitis of intestine, part unspecified, without perforation or abscess without bleeding: Secondary | ICD-10-CM | POA: Diagnosis not present

## 2019-02-02 DIAGNOSIS — F0390 Unspecified dementia without behavioral disturbance: Secondary | ICD-10-CM | POA: Diagnosis present

## 2019-02-02 DIAGNOSIS — Z91018 Allergy to other foods: Secondary | ICD-10-CM

## 2019-02-02 DIAGNOSIS — K921 Melena: Secondary | ICD-10-CM

## 2019-02-02 DIAGNOSIS — Z7982 Long term (current) use of aspirin: Secondary | ICD-10-CM

## 2019-02-02 DIAGNOSIS — Z9049 Acquired absence of other specified parts of digestive tract: Secondary | ICD-10-CM

## 2019-02-02 DIAGNOSIS — Z833 Family history of diabetes mellitus: Secondary | ICD-10-CM

## 2019-02-02 DIAGNOSIS — R112 Nausea with vomiting, unspecified: Secondary | ICD-10-CM

## 2019-02-02 DIAGNOSIS — N1831 Chronic kidney disease, stage 3a: Secondary | ICD-10-CM | POA: Diagnosis present

## 2019-02-02 DIAGNOSIS — R279 Unspecified lack of coordination: Secondary | ICD-10-CM | POA: Diagnosis not present

## 2019-02-02 DIAGNOSIS — R64 Cachexia: Secondary | ICD-10-CM | POA: Diagnosis present

## 2019-02-02 DIAGNOSIS — R531 Weakness: Secondary | ICD-10-CM

## 2019-02-02 DIAGNOSIS — R4182 Altered mental status, unspecified: Secondary | ICD-10-CM | POA: Diagnosis present

## 2019-02-02 DIAGNOSIS — R1011 Right upper quadrant pain: Secondary | ICD-10-CM

## 2019-02-02 DIAGNOSIS — Z881 Allergy status to other antibiotic agents status: Secondary | ICD-10-CM

## 2019-02-02 DIAGNOSIS — I2510 Atherosclerotic heart disease of native coronary artery without angina pectoris: Secondary | ICD-10-CM | POA: Diagnosis present

## 2019-02-02 DIAGNOSIS — D696 Thrombocytopenia, unspecified: Secondary | ICD-10-CM | POA: Diagnosis present

## 2019-02-02 DIAGNOSIS — E1122 Type 2 diabetes mellitus with diabetic chronic kidney disease: Secondary | ICD-10-CM | POA: Diagnosis present

## 2019-02-02 DIAGNOSIS — E785 Hyperlipidemia, unspecified: Secondary | ICD-10-CM | POA: Diagnosis present

## 2019-02-02 DIAGNOSIS — Z7983 Long term (current) use of bisphosphonates: Secondary | ICD-10-CM

## 2019-02-02 DIAGNOSIS — Z515 Encounter for palliative care: Secondary | ICD-10-CM

## 2019-02-02 DIAGNOSIS — H3530 Unspecified macular degeneration: Secondary | ICD-10-CM | POA: Diagnosis present

## 2019-02-02 DIAGNOSIS — Z880 Allergy status to penicillin: Secondary | ICD-10-CM

## 2019-02-02 DIAGNOSIS — R0600 Dyspnea, unspecified: Secondary | ICD-10-CM

## 2019-02-02 DIAGNOSIS — R0902 Hypoxemia: Secondary | ICD-10-CM

## 2019-02-02 DIAGNOSIS — H9190 Unspecified hearing loss, unspecified ear: Secondary | ICD-10-CM | POA: Diagnosis present

## 2019-02-02 DIAGNOSIS — Z66 Do not resuscitate: Secondary | ICD-10-CM | POA: Diagnosis present

## 2019-02-02 DIAGNOSIS — S299XXA Unspecified injury of thorax, initial encounter: Secondary | ICD-10-CM | POA: Diagnosis not present

## 2019-02-02 DIAGNOSIS — Z79891 Long term (current) use of opiate analgesic: Secondary | ICD-10-CM

## 2019-02-02 DIAGNOSIS — I130 Hypertensive heart and chronic kidney disease with heart failure and stage 1 through stage 4 chronic kidney disease, or unspecified chronic kidney disease: Secondary | ICD-10-CM | POA: Diagnosis present

## 2019-02-02 DIAGNOSIS — K6389 Other specified diseases of intestine: Secondary | ICD-10-CM | POA: Diagnosis not present

## 2019-02-02 DIAGNOSIS — I447 Left bundle-branch block, unspecified: Secondary | ICD-10-CM | POA: Diagnosis present

## 2019-02-02 DIAGNOSIS — D649 Anemia, unspecified: Secondary | ICD-10-CM | POA: Diagnosis not present

## 2019-02-02 DIAGNOSIS — R52 Pain, unspecified: Secondary | ICD-10-CM | POA: Diagnosis not present

## 2019-02-02 DIAGNOSIS — I10 Essential (primary) hypertension: Secondary | ICD-10-CM | POA: Diagnosis not present

## 2019-02-02 DIAGNOSIS — R0681 Apnea, not elsewhere classified: Secondary | ICD-10-CM | POA: Diagnosis present

## 2019-02-02 DIAGNOSIS — R933 Abnormal findings on diagnostic imaging of other parts of digestive tract: Secondary | ICD-10-CM | POA: Diagnosis not present

## 2019-02-02 DIAGNOSIS — Z20828 Contact with and (suspected) exposure to other viral communicable diseases: Secondary | ICD-10-CM | POA: Diagnosis present

## 2019-02-02 DIAGNOSIS — Z888 Allergy status to other drugs, medicaments and biological substances status: Secondary | ICD-10-CM

## 2019-02-02 DIAGNOSIS — Z91013 Allergy to seafood: Secondary | ICD-10-CM

## 2019-02-02 DIAGNOSIS — I340 Nonrheumatic mitral (valve) insufficiency: Secondary | ICD-10-CM | POA: Diagnosis not present

## 2019-02-02 DIAGNOSIS — Z882 Allergy status to sulfonamides status: Secondary | ICD-10-CM

## 2019-02-02 DIAGNOSIS — K5733 Diverticulitis of large intestine without perforation or abscess with bleeding: Secondary | ICD-10-CM | POA: Diagnosis present

## 2019-02-02 DIAGNOSIS — I5023 Acute on chronic systolic (congestive) heart failure: Secondary | ICD-10-CM | POA: Diagnosis present

## 2019-02-02 DIAGNOSIS — Z22322 Carrier or suspected carrier of Methicillin resistant Staphylococcus aureus: Secondary | ICD-10-CM

## 2019-02-02 DIAGNOSIS — K449 Diaphragmatic hernia without obstruction or gangrene: Secondary | ICD-10-CM | POA: Diagnosis present

## 2019-02-02 DIAGNOSIS — C9110 Chronic lymphocytic leukemia of B-cell type not having achieved remission: Secondary | ICD-10-CM | POA: Diagnosis present

## 2019-02-02 DIAGNOSIS — Z974 Presence of external hearing-aid: Secondary | ICD-10-CM

## 2019-02-02 DIAGNOSIS — Z79899 Other long term (current) drug therapy: Secondary | ICD-10-CM

## 2019-02-02 DIAGNOSIS — R109 Unspecified abdominal pain: Secondary | ICD-10-CM | POA: Diagnosis not present

## 2019-02-02 DIAGNOSIS — Z743 Need for continuous supervision: Secondary | ICD-10-CM | POA: Diagnosis not present

## 2019-02-02 DIAGNOSIS — N183 Chronic kidney disease, stage 3 unspecified: Secondary | ICD-10-CM | POA: Diagnosis present

## 2019-02-02 LAB — URINALYSIS, ROUTINE W REFLEX MICROSCOPIC
Bilirubin Urine: NEGATIVE
Glucose, UA: NEGATIVE mg/dL
Ketones, ur: 5 mg/dL — AB
Nitrite: NEGATIVE
Protein, ur: 30 mg/dL — AB
Specific Gravity, Urine: 1.017 (ref 1.005–1.030)
WBC, UA: >50 WBC/hpf — ABNORMAL HIGH (ref 0–5)
pH: 5 (ref 5.0–8.0)

## 2019-02-02 LAB — COMPREHENSIVE METABOLIC PANEL
ALT: 12 U/L (ref 0–44)
AST: 18 U/L (ref 15–41)
Albumin: 3.5 g/dL (ref 3.5–5.0)
Alkaline Phosphatase: 61 U/L (ref 38–126)
Anion gap: 9 (ref 5–15)
BUN: 29 mg/dL — ABNORMAL HIGH (ref 8–23)
CO2: 26 mmol/L (ref 22–32)
Calcium: 9.3 mg/dL (ref 8.9–10.3)
Chloride: 103 mmol/L (ref 98–111)
Creatinine, Ser: 1.24 mg/dL — ABNORMAL HIGH (ref 0.44–1.00)
GFR calc Af Amer: 43 mL/min — ABNORMAL LOW (ref >60)
GFR calc non Af Amer: 37 mL/min — ABNORMAL LOW (ref >60)
Glucose, Bld: 151 mg/dL — ABNORMAL HIGH (ref 70–99)
Potassium: 5.2 mmol/L — ABNORMAL HIGH (ref 3.5–5.1)
Sodium: 138 mmol/L (ref 135–145)
Total Bilirubin: 0.9 mg/dL (ref 0.3–1.2)
Total Protein: 6.1 g/dL — ABNORMAL LOW (ref 6.5–8.1)

## 2019-02-02 LAB — ABO/RH: ABO/RH(D): B POS

## 2019-02-02 LAB — CBC
HCT: 35.9 % — ABNORMAL LOW (ref 36.0–46.0)
Hemoglobin: 11.5 g/dL — ABNORMAL LOW (ref 12.0–15.0)
MCH: 31.9 pg (ref 26.0–34.0)
MCHC: 32 g/dL (ref 30.0–36.0)
MCV: 99.7 fL (ref 80.0–100.0)
Platelets: 146 10*3/uL — ABNORMAL LOW (ref 150–400)
RBC: 3.6 MIL/uL — ABNORMAL LOW (ref 3.87–5.11)
RDW: 11.9 % (ref 11.5–15.5)
WBC: 30.6 10*3/uL — ABNORMAL HIGH (ref 4.0–10.5)
nRBC: 0 % (ref 0.0–0.2)

## 2019-02-02 LAB — LIPASE, BLOOD: Lipase: 34 U/L (ref 11–51)

## 2019-02-02 LAB — TYPE AND SCREEN
ABO/RH(D): B POS
Antibody Screen: NEGATIVE

## 2019-02-02 MED ORDER — SODIUM CHLORIDE 0.9% FLUSH: 3.0000 mL | Freq: Once | INTRAVENOUS | Status: DC

## 2019-02-02 NOTE — ED Triage Notes (Signed)
C/o nausea, vomiting, diarrhea, and lower abd pain since this morning.  Last diarrhea had bright red blood.

## 2019-02-03 ENCOUNTER — Inpatient Hospital Stay (HOSPITAL_COMMUNITY): Payer: Medicare PPO

## 2019-02-03 ENCOUNTER — Emergency Department (HOSPITAL_COMMUNITY): Payer: Medicare PPO

## 2019-02-03 ENCOUNTER — Encounter (HOSPITAL_COMMUNITY): Payer: Self-pay

## 2019-02-03 DIAGNOSIS — I447 Left bundle-branch block, unspecified: Secondary | ICD-10-CM | POA: Diagnosis present

## 2019-02-03 DIAGNOSIS — C9110 Chronic lymphocytic leukemia of B-cell type not having achieved remission: Secondary | ICD-10-CM | POA: Diagnosis present

## 2019-02-03 DIAGNOSIS — A419 Sepsis, unspecified organism: Secondary | ICD-10-CM | POA: Diagnosis present

## 2019-02-03 DIAGNOSIS — E1122 Type 2 diabetes mellitus with diabetic chronic kidney disease: Secondary | ICD-10-CM | POA: Diagnosis present

## 2019-02-03 DIAGNOSIS — K219 Gastro-esophageal reflux disease without esophagitis: Secondary | ICD-10-CM | POA: Diagnosis present

## 2019-02-03 DIAGNOSIS — I5023 Acute on chronic systolic (congestive) heart failure: Secondary | ICD-10-CM | POA: Diagnosis present

## 2019-02-03 DIAGNOSIS — D696 Thrombocytopenia, unspecified: Secondary | ICD-10-CM | POA: Diagnosis present

## 2019-02-03 DIAGNOSIS — I340 Nonrheumatic mitral (valve) insufficiency: Secondary | ICD-10-CM | POA: Diagnosis not present

## 2019-02-03 DIAGNOSIS — I5022 Chronic systolic (congestive) heart failure: Secondary | ICD-10-CM | POA: Diagnosis not present

## 2019-02-03 DIAGNOSIS — K5733 Diverticulitis of large intestine without perforation or abscess with bleeding: Secondary | ICD-10-CM | POA: Diagnosis present

## 2019-02-03 DIAGNOSIS — R0681 Apnea, not elsewhere classified: Secondary | ICD-10-CM | POA: Diagnosis present

## 2019-02-03 DIAGNOSIS — I130 Hypertensive heart and chronic kidney disease with heart failure and stage 1 through stage 4 chronic kidney disease, or unspecified chronic kidney disease: Secondary | ICD-10-CM | POA: Diagnosis present

## 2019-02-03 DIAGNOSIS — Z20828 Contact with and (suspected) exposure to other viral communicable diseases: Secondary | ICD-10-CM | POA: Diagnosis present

## 2019-02-03 DIAGNOSIS — Z66 Do not resuscitate: Secondary | ICD-10-CM | POA: Diagnosis present

## 2019-02-03 DIAGNOSIS — N183 Chronic kidney disease, stage 3 unspecified: Secondary | ICD-10-CM | POA: Diagnosis present

## 2019-02-03 DIAGNOSIS — H9190 Unspecified hearing loss, unspecified ear: Secondary | ICD-10-CM | POA: Diagnosis present

## 2019-02-03 DIAGNOSIS — Z515 Encounter for palliative care: Secondary | ICD-10-CM | POA: Diagnosis not present

## 2019-02-03 DIAGNOSIS — H3530 Unspecified macular degeneration: Secondary | ICD-10-CM | POA: Diagnosis present

## 2019-02-03 DIAGNOSIS — R4182 Altered mental status, unspecified: Secondary | ICD-10-CM | POA: Diagnosis present

## 2019-02-03 DIAGNOSIS — K921 Melena: Secondary | ICD-10-CM | POA: Diagnosis present

## 2019-02-03 DIAGNOSIS — I2510 Atherosclerotic heart disease of native coronary artery without angina pectoris: Secondary | ICD-10-CM | POA: Diagnosis present

## 2019-02-03 DIAGNOSIS — Z7982 Long term (current) use of aspirin: Secondary | ICD-10-CM | POA: Diagnosis not present

## 2019-02-03 DIAGNOSIS — E785 Hyperlipidemia, unspecified: Secondary | ICD-10-CM | POA: Diagnosis present

## 2019-02-03 DIAGNOSIS — Z9049 Acquired absence of other specified parts of digestive tract: Secondary | ICD-10-CM | POA: Diagnosis not present

## 2019-02-03 DIAGNOSIS — F0390 Unspecified dementia without behavioral disturbance: Secondary | ICD-10-CM | POA: Diagnosis present

## 2019-02-03 DIAGNOSIS — D638 Anemia in other chronic diseases classified elsewhere: Secondary | ICD-10-CM | POA: Diagnosis present

## 2019-02-03 DIAGNOSIS — N1831 Chronic kidney disease, stage 3a: Secondary | ICD-10-CM | POA: Diagnosis present

## 2019-02-03 DIAGNOSIS — K449 Diaphragmatic hernia without obstruction or gangrene: Secondary | ICD-10-CM | POA: Diagnosis present

## 2019-02-03 DIAGNOSIS — R64 Cachexia: Secondary | ICD-10-CM | POA: Diagnosis present

## 2019-02-03 LAB — GASTROINTESTINAL PANEL BY PCR, STOOL (REPLACES STOOL CULTURE)

## 2019-02-03 LAB — HEMATOCRIT: HCT: 32.1 % — ABNORMAL LOW (ref 36.0–46.0)

## 2019-02-03 LAB — COMPREHENSIVE METABOLIC PANEL
ALT: 12 U/L (ref 0–44)
AST: 16 U/L (ref 15–41)
Albumin: 3 g/dL — ABNORMAL LOW (ref 3.5–5.0)
Alkaline Phosphatase: 48 U/L (ref 38–126)
Anion gap: 10 (ref 5–15)
BUN: 35 mg/dL — ABNORMAL HIGH (ref 8–23)
CO2: 24 mmol/L (ref 22–32)
Calcium: 8.6 mg/dL — ABNORMAL LOW (ref 8.9–10.3)
Chloride: 105 mmol/L (ref 98–111)
Creatinine, Ser: 1.4 mg/dL — ABNORMAL HIGH (ref 0.44–1.00)
GFR calc Af Amer: 37 mL/min — ABNORMAL LOW (ref >60)
GFR calc non Af Amer: 32 mL/min — ABNORMAL LOW (ref >60)
Glucose, Bld: 162 mg/dL — ABNORMAL HIGH (ref 70–99)
Potassium: 4.6 mmol/L (ref 3.5–5.1)
Sodium: 139 mmol/L (ref 135–145)
Total Bilirubin: 0.7 mg/dL (ref 0.3–1.2)
Total Protein: 5.5 g/dL — ABNORMAL LOW (ref 6.5–8.1)

## 2019-02-03 LAB — HEMOGLOBIN AND HEMATOCRIT, BLOOD
HCT: 37.4 % (ref 36.0–46.0)
Hemoglobin: 12 g/dL (ref 12.0–15.0)

## 2019-02-03 LAB — CBC
HCT: 30.9 % — ABNORMAL LOW (ref 36.0–46.0)
Hemoglobin: 10 g/dL — ABNORMAL LOW (ref 12.0–15.0)
MCH: 32.3 pg (ref 26.0–34.0)
MCHC: 32.4 g/dL (ref 30.0–36.0)
MCV: 99.7 fL (ref 80.0–100.0)
Platelets: 124 10*3/uL — ABNORMAL LOW (ref 150–400)
RBC: 3.1 MIL/uL — ABNORMAL LOW (ref 3.87–5.11)
RDW: 12 % (ref 11.5–15.5)
WBC: 29 10*3/uL — ABNORMAL HIGH (ref 4.0–10.5)
nRBC: 0 % (ref 0.0–0.2)

## 2019-02-03 LAB — C DIFFICILE QUICK SCREEN W PCR REFLEX
C Diff antigen: NEGATIVE
C Diff interpretation: NOT DETECTED
C Diff toxin: NEGATIVE

## 2019-02-03 LAB — POC OCCULT BLOOD, ED: Fecal Occult Bld: POSITIVE — AB

## 2019-02-03 LAB — PROTIME-INR
INR: 1.2 (ref 0.8–1.2)
Prothrombin Time: 14.7 seconds (ref 11.4–15.2)

## 2019-02-03 LAB — HEMOGLOBIN: Hemoglobin: 10.4 g/dL — ABNORMAL LOW (ref 12.0–15.0)

## 2019-02-03 LAB — SARS CORONAVIRUS 2 (TAT 6-24 HRS): SARS Coronavirus 2: NEGATIVE

## 2019-02-03 LAB — TROPONIN I (HIGH SENSITIVITY): Troponin I (High Sensitivity): 26 ng/L — ABNORMAL HIGH (ref <18)

## 2019-02-03 LAB — LACTIC ACID, PLASMA
Lactic Acid, Venous: 2.7 mmol/L (ref 0.5–1.9)
Lactic Acid, Venous: 1.9 mmol/L (ref 0.5–1.9)

## 2019-02-03 MED ORDER — ACETAMINOPHEN 650 MG RE SUPP: 650.0000 mg | Freq: Four times a day (QID) | RECTAL | Status: DC | PRN

## 2019-02-03 MED ORDER — METRONIDAZOLE IN NACL 5-0.79 MG/ML-% IV SOLN
500.0000 mg | Freq: Three times a day (TID) | INTRAVENOUS | Status: DC
  Administered 2019-02-03 – 2019-02-07 (×12): 500 mg via INTRAVENOUS
  Filled 2019-02-03 (×13): qty 100

## 2019-02-03 MED ORDER — SODIUM CHLORIDE 0.9 % IV BOLUS (SEPSIS)
1000.0000 mL | Freq: Once | INTRAVENOUS | Status: AC
  Administered 2019-02-03: 1000 mL via INTRAVENOUS

## 2019-02-03 MED ORDER — ONDANSETRON HCL 4 MG/2ML IJ SOLN
4.0000 mg | Freq: Four times a day (QID) | INTRAMUSCULAR | Status: DC | PRN
  Administered 2019-02-08: 4 mg via INTRAVENOUS
  Filled 2019-02-03: qty 2

## 2019-02-03 MED ORDER — ACETAMINOPHEN 325 MG PO TABS
650.0000 mg | ORAL_TABLET | Freq: Four times a day (QID) | ORAL | Status: DC | PRN
  Administered 2019-02-09: 650 mg via ORAL
  Filled 2019-02-03: qty 2

## 2019-02-03 MED ORDER — SODIUM CHLORIDE 0.9 % IV SOLN
2.0000 g | INTRAVENOUS | Status: DC
  Administered 2019-02-04 – 2019-02-06 (×3): 2 g via INTRAVENOUS
  Filled 2019-02-03 (×3): qty 20

## 2019-02-03 MED ORDER — ONDANSETRON HCL 4 MG PO TABS
4.0000 mg | ORAL_TABLET | Freq: Four times a day (QID) | ORAL | Status: DC | PRN
  Administered 2019-02-06: 4 mg via ORAL
  Filled 2019-02-03: qty 1

## 2019-02-03 MED ORDER — SODIUM CHLORIDE 0.9 % IV BOLUS (SEPSIS)
500.0000 mL | Freq: Once | INTRAVENOUS | Status: AC
  Administered 2019-02-03: 500 mL via INTRAVENOUS

## 2019-02-03 MED ORDER — SODIUM CHLORIDE 0.9 % IV SOLN
250.0000 mL | INTRAVENOUS | Status: DC | PRN
  Administered 2019-02-05: 250 mL via INTRAVENOUS

## 2019-02-03 MED ORDER — HYDROCODONE-ACETAMINOPHEN 5-325 MG PO TABS
1.0000 | ORAL_TABLET | Freq: Four times a day (QID) | ORAL | Status: DC | PRN
  Administered 2019-02-05 – 2019-02-09 (×5): 1 via ORAL
  Filled 2019-02-03 (×5): qty 1

## 2019-02-03 MED ORDER — SODIUM CHLORIDE 0.9 % IV SOLN
INTRAVENOUS | Status: DC
  Administered 2019-02-03: 15:00:00 via INTRAVENOUS

## 2019-02-03 MED ORDER — FENTANYL CITRATE (PF) 100 MCG/2ML IJ SOLN
50.0000 ug | Freq: Once | INTRAMUSCULAR | Status: AC
  Administered 2019-02-03: 50 ug via INTRAVENOUS
  Filled 2019-02-03: qty 2

## 2019-02-03 MED ORDER — METRONIDAZOLE IN NACL 5-0.79 MG/ML-% IV SOLN
500.0000 mg | Freq: Once | INTRAVENOUS | Status: AC
  Administered 2019-02-03: 500 mg via INTRAVENOUS
  Filled 2019-02-03: qty 100

## 2019-02-03 MED ORDER — FENTANYL CITRATE (PF) 100 MCG/2ML IJ SOLN
50.0000 ug | INTRAMUSCULAR | Status: DC | PRN
  Administered 2019-02-07: 50 ug via INTRAVENOUS
  Filled 2019-02-03 (×2): qty 2

## 2019-02-03 MED ORDER — SODIUM CHLORIDE 0.9 % IV SOLN
1.0000 g | Freq: Once | INTRAVENOUS | Status: AC
  Administered 2019-02-03: 1 g via INTRAVENOUS
  Filled 2019-02-03: qty 10

## 2019-02-03 MED ORDER — SODIUM CHLORIDE 0.9% FLUSH: 3.0000 mL | INTRAVENOUS | Status: DC | PRN

## 2019-02-03 MED ORDER — SODIUM CHLORIDE 0.9% FLUSH
3.0000 mL | Freq: Two times a day (BID) | INTRAVENOUS | Status: DC
  Administered 2019-02-03 – 2019-02-09 (×11): 3 mL via INTRAVENOUS

## 2019-02-03 MED ORDER — SODIUM CHLORIDE 0.9% FLUSH
3.0000 mL | Freq: Two times a day (BID) | INTRAVENOUS | Status: DC
  Administered 2019-02-03 – 2019-02-09 (×8): 3 mL via INTRAVENOUS

## 2019-02-03 MED ORDER — ONDANSETRON HCL 4 MG/2ML IJ SOLN
4.0000 mg | Freq: Once | INTRAMUSCULAR | Status: AC
  Administered 2019-02-03: 4 mg via INTRAVENOUS
  Filled 2019-02-03: qty 2

## 2019-02-03 NOTE — ED Notes (Signed)
Walked past room and noted that pt was satting 56% on RA. Entered room immediately and pt was noted to be extremely bradpyneic, possibly apneic. Sternal rubbed pt, she moaned and pt was encouraged to take deep breaths. Pt placed on 10LPM [place], sats improved to 100%, O2 downtitrated to 5LPM. Daughter reports that she "has been doing this". When asked what she had been doing, daughtre reports she "drops down to the 50s every 30 minutes or so, she wakes her up and she comes back up in a couple of minutes". MD Paged immediately and made aware. Primary RN notified. No return page back yet at time of this note

## 2019-02-03 NOTE — Progress Notes (Signed)
Came down and saw her- per RN during sleep having desat episodes and also apneic briefly-on 4l [place] at 100%, weaned to 3l still 100%. Daughter reports she has had Legionnaire's disease in 92s and has "stomach in chest" and has low lung capacity. At home they have baby monitor for her and at times they have to check to make sure she is breathing when they do not see chest moving. Currently-resting and sleeping- daughter notes that she breathes shallow in sleep at home too. She denies any chest pain or shortness of breath covid is neg. Checked cxr-reviewed personally and has chronically elevated left hemidiaphragm. Cont supplemental o2 to keep sat above 94% She is not tachypneic or tachycardic. Again asked abt code status and she is going to talk to her sister who is a POA and let us know if need to change ad pt had wished for [facility] in the past per daughter, currently full code. Cont on tele. I called POA: and updated and discussed, she wishes and confirms DNI but okay for CPR/meds

## 2019-02-03 NOTE — ED Provider Notes (Signed)
TIME SEEN: 3:58 AM  CHIEF COMPLAINT: Abdominal pain  HPI: Patient is a 85 year old female with history of dementia, hypertension, hyperlipidemia, CHF, diabetes, CLL who presents to the emergency department with nausea, vomiting, diarrhea and abdominal pain that started earlier yesterday.  No known fevers, cough, complaints of chest pain or shortness of breath.  Patient had one episode of hematochezia at home and then 1 here in the emergency department on my evaluation.  No history of previous GI bleed.  Not on antiplatelets or anticoagulants.  Last colonoscopy was several years ago by Dr. Paulita Fujita.  Patient lives with her daughter.  No recent sick contacts or COVID-19 exposures.  Patient is status post cholecystectomy.  Daughter states that she is concerned that this could also be coming from her heart because when she has complained of upper abdominal pain before it has been cardiac in nature.  PCP - Dr. Zadie Rhine with [facility] - Dr. Paulita Fujita  ROS: Level 5 caveat secondary to dementia  PAST MEDICAL HISTORY/PAST SURGICAL HISTORY:  Past Medical History:  Diagnosis Date  . Anemia   . Arthritis   . Chronic systolic heart failure ([place]) 04/03/2015  . Dementia ([place])   . Diabetes mellitus   . Essential hypertension 04/03/2015  . GERD (gastroesophageal reflux disease)   . Hernia   . Hyperlipidemia 04/03/2015  . Hypertension   . Macular degeneration disease     MEDICATIONS:  Prior to Admission medications   Medication Sig Start Date End Date Taking? Authorizing Provider  aspirin 81 MG tablet Take 81 mg by mouth daily.    [provider]  carvedilol (COREG) 6.25 MG tablet TAKE 1 TABLET TWICE DAILY 11/04/18   Skeet Latch, MD  Cyanocobalamin (VITAMIN B 12) 500 MCG TABS Take 500 mcg by mouth every Monday, Wednesday, and Friday.     [provider]  ezetimibe (ZETIA) 10 MG tablet Take 10 mg by mouth daily.    [provider]  furosemide (LASIX) 20 MG tablet Take 1 tablet (20  mg total) by mouth as needed. For weight gain more that 2 pounds in a day or 5 pounds in a week. 10/09/16   Skeet Latch, MD  glycerin adult 2 G SUPP Place 1 suppository rectally once as needed for moderate constipation.    [provider]  ibandronate (BONIVA) 150 MG tablet Take 150 mg by mouth every 30 (thirty) days. Take in the morning with a full glass of water, on an empty stomach, and do not take anything else by mouth or lie down for the next 30 min.    [provider]  irbesartan (AVAPRO) 150 MG tablet Take 1 tablet (150 mg total) daily by mouth. 02/16/17   Skeet Latch, MD  mirtazapine (REMERON) 15 MG tablet TAKE 1 TABLET BY MOUTH ONCE DAILY AT BEDTIME FOR 30 DAYS 08/24/18   [provider]  Multiple Vitamin (MULTIVITAMIN) tablet Take 1 tablet by mouth daily.    [provider]  NONFORMULARY OR COMPOUNDED ITEM Apply 1 application topically as needed (apply to bottom area). Ketoconazole and fluticasone compounded cream.  Apply as needed to affected areas as needed as direct    [provider]  omeprazole (PRILOSEC) 40 MG capsule Take 40 mg by mouth daily.    [provider]  ondansetron (ZOFRAN) 4 MG tablet Take 1 tablet (4 mg total) by mouth every 8 (eight) hours as needed for nausea or vomiting. 06/10/18   Hayden Rasmussen, MD  spironolactone (ALDACTONE) 25 MG  tablet Take 0.5 tablets (12.5 mg total) by mouth daily. *NEEDS OFFICE VISIT* 11/30/18   Skeet Latch, MD  sucralfate (CARAFATE) 1 g tablet Take 1 tablet (1 g total) by mouth 4 (four) times daily -  with meals and at bedtime. 06/10/18   Hayden Rasmussen, MD  traMADol (ULTRAM) 50 MG tablet Take 1 tablet (50 mg total) by mouth 3 (three) times daily as needed for severe pain. 09/04/14   Carmin Muskrat, MD  Vitamin D, Ergocalciferol, (DRISDOL) 1.25 MG (50000 UT) CAPS capsule TAKE 1 CAPSULE BY MOUTH ONCE A WEEK FOR 90 DAYS 06/28/18   [provider]    ALLERGIES:   Allergies  Allergen Reactions  . Fish Allergy Anaphylaxis  . Iodine Anaphylaxis  . Actonel [Risedronate Sodium] Nausea And Vomiting  . Atorvastatin Other (See Comments)    Blisters in mouth.   Blisters in mouth.    . Ciprofloxacin Nausea And Vomiting  . Codeine Other (See Comments)    Headache  Headache  . Corticotropin Other (See Comments)    Confusion  Confusion  . Demeclocycline     Stomach pain  . Dihydrotachysterol     Dizzy confused  . Garamycin [Gentamicin Sulfate] Rash  . Lactose Intolerance (Gi) Other (See Comments)    Intolerance.    . Lactulose     Intolerance.    . Lovastatin Other (See Comments)    blisters blisters  . Macrobid [Nitrofurantoin Monohyd Macro] Nausea And Vomiting  . Meperidine Other (See Comments)    Hypotension  Hypotension  . Other Hives and Swelling    Melons and strawberries.    . Paxil [Paroxetine Hcl] Nausea Only  . Penicillins Other (See Comments)    Did it involve swelling of the face/tongue/throat, SOB, or low BP? No Did it involve sudden or severe rash/hives, skin peeling, or any reaction on the inside of your mouth or nose? Unknown Did you need to seek medical attention at a hospital or doctor's office? Unknown When did it last happen?Childhood If all above answers are "NO", may proceed with cephalosporin use. Pain.    . Rofecoxib Other (See Comments)    Not known Not known  . Sulfa Antibiotics Swelling  . Tetracyclines & Related Other (See Comments)    Stomach pain  . Vitamin D Analogs Other (See Comments)    Dizzy confused    SOCIAL HISTORY:  Social History   Tobacco Use  . Smoking status: Never Smoker  . Smokeless tobacco: Never Used  Substance Use Topics  . Alcohol use: No    FAMILY HISTORY: Family History  Problem Relation Age of Onset  . Cancer Sister        Breast cancer  . Diabetes Brother   . Diabetes Maternal Grandmother   . Heart Problems Maternal Grandfather   . Heart Problems Paternal  Grandmother   . Heart Problems Paternal Grandfather   . Diabetes Brother   . Diabetes Brother   . Heart Problems Daughter   . Heart Problems Daughter     EXAM: BP 113/67 (BP Location: Right Arm)   Pulse 93   Temp 99.3 F (37.4 C) (Oral)   Resp 16   SpO2 93%  CONSTITUTIONAL: Alert and hard of hearing.  Elderly, thin. HEAD: Normocephalic EYES: Conjunctivae clear, pupils appear equal, EOMI ENT: normal nose; moist mucous membranes NECK: Supple, no meningismus, no nuchal rigidity, no LAD  CARD: RRR; S1 and S2 appreciated; no murmurs, no clicks, no rubs, no gallops RESP: Normal chest  excursion without splinting or tachypnea; breath sounds clear and equal bilaterally; no wheezes, no rhonchi, no rales, no hypoxia or respiratory distress, speaking full sentences ABD/GI: Normal bowel sounds; non-distended; soft, diffusely tender with intermittent voluntary guarding, no rebound BACK:  The back appears normal and is non-tender to palpation, there is no CVA tenderness, + kyphosis EXT: Normal ROM in all joints; non-tender to palpation; no edema; normal capillary refill; no cyanosis, no calf tenderness or swelling    SKIN: Normal color for age and race; warm; no rash NEURO: Moves all extremities equally, normal gait PSYCH: The patient's mood and manner are appropriate. Grooming and personal hygiene are appropriate.  MEDICAL DECISION MAKING: Patient here with abdominal pain, vomiting and diarrhea that started 24 hours ago now with hematochezia x2.  Had large bloody bowel movement in the ED.  Diffusely tender on exam.  Currently hemodynamically stable.  Rectal temp of 99.9.  Patient does have history of CLL that is being monitored.  White count normally runs between 11-15,000 but today is 30,000.  Concern for possible infectious etiology.  Will obtain blood and stool cultures.  Urine does appear infected.  Will give IV Rocephin and send urine culture.  Will give IV fluids, pain and nausea medicine.  Will  obtain lactate and CT of abdomen pelvis.  Differential includes colitis, diverticulitis, viral gastroenteritis, appendicitis, infectious diarrhea.  She is status post cholecystectomy.  Patient will need admission.  Initial hemoglobin normal.  Will recheck given she has had extended wait time in the waiting room.  Given patient's history of previous CAD with upper abdominal pain, will obtain troponin.  EKG shows left bundle branch block with no changes compared to previous.  ED PROGRESS: Repeat hemoglobin is stable.  Lactate mildly elevated at 2.7.  Her 30 mL/kg IV fluid bolus for sepsis would be 1200 mL.  She has received 500 mL here.  Will give another liter.  CT scan shows diverticulitis/colitis.  Stool studies are pending.  Will add on IV Flagyl.  Will discuss with medicine for admission.   5:59 AM Discussed patient's case with hospitalist, Dr. Myna Hidalgo.  I have recommended admission and patient (and family if present) agree with this plan. Admitting physician will place admission orders.   I reviewed all nursing notes, vitals, pertinent previous records and interpreted all EKGs, lab and urine results, imaging (as available).     EKG Interpretation  Date/Time:  Thursday February 03 2019 04:27:01 EST Ventricular Rate:  83 PR Interval:    QRS Duration: 166 QT Interval:  435 QTC Calculation: 512 R Axis:   27 Text Interpretation: Sinus rhythm Left bundle branch block No significant change since last tracing Confirmed by Aleric Froelich, Cyril Mourning [phone]) on 02/03/2019 4:39:08 AM        CRITICAL CARE Performed by: Cyril Mourning Willona Phariss   Total critical care time: 65 minutes  Critical care time was exclusive of separately billable procedures and treating other patients.  Critical care was necessary to treat or prevent imminent or life-threatening deterioration.  Critical care was time spent personally by me on the following activities: development of treatment plan with patient and/or surrogate as well as  nursing, discussions with consultants, evaluation of patient's response to treatment, examination of patient, obtaining history from patient or surrogate, ordering and performing treatments and interventions, ordering and review of laboratory studies, ordering and review of radiographic studies, pulse oximetry and re-evaluation of patient's condition.  Mary Cline was evaluated in Emergency Department on 02/03/2019 for the symptoms described in  the history of present illness. She was evaluated in the context of the global COVID-19 pandemic, which necessitated consideration that the patient might be at risk for infection with the SARS-CoV-2 virus that causes COVID-19. Institutional protocols and algorithms that pertain to the evaluation of patients at risk for COVID-19 are in a state of rapid change based on information released by regulatory bodies including the CDC and federal and state organizations. These policies and algorithms were followed during the patient's care in the ED.    Mary Cline, Delice Bison, DO 02/03/19 [phone]

## 2019-02-03 NOTE — Consult Note (Signed)
[facility] Consultation Note  Referring Provider: [facility] Primary Care Physician:  [name], MD  Reason for Consultation:  GI bleeding, diverticulitis  HPI: Mary Cline is a 85 y.o. female whom we were asked to see for above reasons.  Patient is confused right now and history obtained via discussion with her daughter, who is at the bedside.  Patient has one day history of lower abdominal discomfort and hematochezia.  Upon arrival here she had imaging consistent with sigmoid diverticulitis.     Past Medical History:  Diagnosis Date  . Anemia   . Arthritis   . Chronic systolic heart failure ([place]) 04/03/2015  . Dementia ([place])   . Diabetes mellitus   . Essential hypertension 04/03/2015  . GERD (gastroesophageal reflux disease)   . Hernia   . Hyperlipidemia 04/03/2015  . Hypertension   . Macular degeneration disease     Past Surgical History:  Procedure Laterality Date  . ANGIOPLASTY    . APPENDECTOMY    . HYSTEROTOMY      Prior to Admission medications   Medication Sig Start Date End Date Taking? Authorizing Provider  aspirin 81 MG tablet Take 81 mg by mouth daily.   Yes [provider]  carvedilol (COREG) 6.25 MG tablet TAKE 1 TABLET TWICE DAILY Patient taking differently: Take 6.25 mg by mouth 2 (two) times daily.  11/04/18  Yes Skeet Latch, MD  cholecalciferol (VITAMIN D3) 25 MCG (1000 UT) tablet Take 1,000 Units by mouth daily.   Yes [provider]  Cyanocobalamin (VITAMIN B 12) 500 MCG TABS Take 500 mcg by mouth every Monday, Wednesday, and Friday.    Yes [provider]  ezetimibe (ZETIA) 10 MG tablet Take 10 mg by mouth daily.   Yes [provider]  furosemide (LASIX) 20 MG tablet Take 1 tablet (20 mg total) by mouth as needed. For weight gain more that 2 pounds in a day or 5 pounds in a week. 10/09/16  Yes Skeet Latch, MD  glycerin adult 2 G SUPP Place 1 suppository rectally once as needed  for moderate constipation.   Yes [provider]  irbesartan (AVAPRO) 150 MG tablet Take 1 tablet (150 mg total) daily by mouth. 02/16/17  Yes Skeet Latch, MD  mirtazapine (REMERON) 15 MG tablet Take 15 mg by mouth at bedtime.  08/24/18  Yes [provider]  Multiple Vitamin (MULTIVITAMIN) tablet Take 1 tablet by mouth daily.   Yes [provider]  omeprazole (PRILOSEC) 40 MG capsule Take 40 mg by mouth daily.   Yes [provider]  ondansetron (ZOFRAN) 4 MG tablet Take 1 tablet (4 mg total) by mouth every 8 (eight) hours as needed for nausea or vomiting. 06/10/18  Yes Hayden Rasmussen, MD  spironolactone (ALDACTONE) 25 MG tablet Take 0.5 tablets (12.5 mg total) by mouth daily. *NEEDS OFFICE VISIT* 11/30/18  Yes Skeet Latch, MD  traMADol (ULTRAM) 50 MG tablet Take 1 tablet (50 mg total) by mouth 3 (three) times daily as needed for severe pain. 09/04/14  Yes Carmin Muskrat, MD    Current Facility-Administered Medications  Medication Dose Route Frequency Provider Last Rate Last Dose  . 0.9 %  sodium chloride infusion  250 mL Intravenous PRN Opyd, Ilene Qua, MD      . 0.9 %  sodium chloride infusion   Intravenous Continuous Kc, Ramesh, MD 30 mL/hr at 02/03/19 1551    . acetaminophen (TYLENOL) tablet 650 mg  650 mg Oral Q6H PRN Opyd,  Ilene Qua, MD       Or  . acetaminophen (TYLENOL) suppository 650 mg  650 mg Rectal Q6H PRN Opyd, Ilene Qua, MD      . Derrill Memo ON 02/04/2019] cefTRIAXone (ROCEPHIN) 2 g in sodium chloride 0.9 % 100 mL IVPB  2 g Intravenous Q24H Opyd, Ilene Qua, MD      . fentaNYL (SUBLIMAZE) injection 50 mcg  50 mcg Intravenous Q2H PRN Opyd, Ilene Qua, MD      . HYDROcodone-acetaminophen (NORCO/VICODIN) 5-325 MG per tablet 1 tablet  1 tablet Oral Q6H PRN Opyd, Ilene Qua, MD      . metroNIDAZOLE (FLAGYL) IVPB 500 mg  500 mg Intravenous Q8H Opyd, Ilene Qua, MD   Stopped at 02/03/19 1551  . ondansetron (ZOFRAN) tablet 4 mg  4 mg Oral Q6H PRN Opyd,  Ilene Qua, MD       Or  . ondansetron (ZOFRAN) injection 4 mg  4 mg Intravenous Q6H PRN Opyd, Ilene Qua, MD      . sodium chloride flush (NS) 0.9 % injection 3 mL  3 mL Intravenous Once Opyd, Ilene Qua, MD      . sodium chloride flush (NS) 0.9 % injection 3 mL  3 mL Intravenous Q12H Opyd, Timothy S, MD      . sodium chloride flush (NS) 0.9 % injection 3 mL  3 mL Intravenous Q12H Opyd, Timothy S, MD      . sodium chloride flush (NS) 0.9 % injection 3 mL  3 mL Intravenous PRN Opyd, Ilene Qua, MD       Current Outpatient Medications  Medication Sig Dispense Refill  . aspirin 81 MG tablet Take 81 mg by mouth daily.    . carvedilol (COREG) 6.25 MG tablet TAKE 1 TABLET TWICE DAILY (Patient taking differently: Take 6.25 mg by mouth 2 (two) times daily. ) 180 tablet 3  . cholecalciferol (VITAMIN D3) 25 MCG (1000 UT) tablet Take 1,000 Units by mouth daily.    . Cyanocobalamin (VITAMIN B 12) 500 MCG TABS Take 500 mcg by mouth every Monday, Wednesday, and Friday.     . ezetimibe (ZETIA) 10 MG tablet Take 10 mg by mouth daily.    . furosemide (LASIX) 20 MG tablet Take 1 tablet (20 mg total) by mouth as needed. For weight gain more that 2 pounds in a day or 5 pounds in a week. 90 tablet 3  . glycerin adult 2 G SUPP Place 1 suppository rectally once as needed for moderate constipation.    . irbesartan (AVAPRO) 150 MG tablet Take 1 tablet (150 mg total) daily by mouth. 90 tablet 3  . mirtazapine (REMERON) 15 MG tablet Take 15 mg by mouth at bedtime.     . Multiple Vitamin (MULTIVITAMIN) tablet Take 1 tablet by mouth daily.    Marland Kitchen omeprazole (PRILOSEC) 40 MG capsule Take 40 mg by mouth daily.    . ondansetron (ZOFRAN) 4 MG tablet Take 1 tablet (4 mg total) by mouth every 8 (eight) hours as needed for nausea or vomiting. 15 tablet 0  . spironolactone (ALDACTONE) 25 MG tablet Take 0.5 tablets (12.5 mg total) by mouth daily. *NEEDS OFFICE VISIT* 45 tablet 0  . traMADol (ULTRAM) 50 MG tablet Take 1 tablet (50 mg  total) by mouth 3 (three) times daily as needed for severe pain. 20 tablet 0    Allergies as of 02/02/2019 - Review Complete 11/16/2018  Allergen Reaction Noted  . Fish allergy Anaphylaxis 09/04/2014  . Iodine Anaphylaxis  09/16/2011  . Actonel [risedronate sodium] Nausea And Vomiting 04/03/2015  . Atorvastatin Other (See Comments) 09/04/2014  . Ciprofloxacin Nausea And Vomiting 04/03/2015  . Codeine Other (See Comments) 09/16/2011  . Corticotropin Other (See Comments) 09/16/2011  . Demeclocycline  04/03/2015  . Dihydrotachysterol  04/03/2015  . Garamycin [gentamicin sulfate] Rash 09/16/2011  . Lactose intolerance (gi) Other (See Comments) 09/04/2014  . Lactulose  09/04/2014  . Lovastatin Other (See Comments) 04/03/2015  . Macrobid [nitrofurantoin monohyd macro] Nausea And Vomiting 04/03/2015  . Meperidine Other (See Comments) 09/16/2011  . Other Hives and Swelling 09/04/2014  . Paxil [paroxetine hcl] Nausea Only 04/03/2015  . Penicillins Other (See Comments) 09/04/2014  . Rofecoxib Other (See Comments) 09/16/2011  . Sulfa antibiotics Swelling 09/16/2011  . Tetracyclines & related Other (See Comments) 04/03/2015  . Vitamin d analogs Other (See Comments) 04/03/2015    Family History  Problem Relation Age of Onset  . Cancer Sister        Breast cancer  . Diabetes Brother   . Diabetes Maternal Grandmother   . Heart Problems Maternal Grandfather   . Heart Problems Paternal Grandmother   . Heart Problems Paternal Grandfather   . Diabetes Brother   . Diabetes Brother   . Heart Problems Daughter   . Heart Problems Daughter     Social History   Socioeconomic History  . Marital status: Widowed    Spouse name: Not on file  . Number of children: 4  . Years of education: Therapist, sports   . Highest education level: Not on file  Occupational History  . Not on file  Social Needs  . Financial resource strain: Not on file  . Food insecurity    Worry: Not on file    Inability: Not on file   . Transportation needs    Medical: Not on file    Non-medical: Not on file  Tobacco Use  . Smoking status: Never Smoker  . Smokeless tobacco: Never Used  Substance and Sexual Activity  . Alcohol use: No  . Drug use: No  . Sexual activity: Not on file  Lifestyle  . Physical activity    Days per week: Not on file    Minutes per session: Not on file  . Stress: Not on file  Relationships  . Social Herbalist on phone: Not on file    Gets together: Not on file    Attends religious service: Not on file    Active member of club or organization: Not on file    Attends meetings of clubs or organizations: Not on file    Relationship status: Not on file  . Intimate partner violence    Fear of current or ex partner: Not on file    Emotionally abused: Not on file    Physically abused: Not on file    Forced sexual activity: Not on file  Other Topics Concern  . Not on file  Social History Narrative   Patient lives at home with her daughter.    Patient is retired.    Patient is a Education administrator.    Patient was an RN    Patient has 4 children.             Epworth Sleepiness Scale = 10 (as of 04/03/2015)    Review of Systems: Unable to obtain due to altered mental status.  Physical Exam: Vital signs in last 24 hours: Temp:  [98.1 F (36.7 C)-99.3 F (37.4 C)] 99.3 F (37.4  C) (11/04 1925) Pulse Rate:  [68-93] 70 (11/05 1430) Resp:  [13-20] 19 (11/05 1430) BP: (97-127)/(42-71) 112/45 (11/05 1430) SpO2:  [88 %-97 %] 94 % (11/05 1430)   General:   Somnolent, a bit arousable but can't answer questions; very thin and somewhat cachectic-appearing. Head:  Normocephalic and atraumatic. Eyes:  Sclera clear, no icterus.   Conjunctiva pink. Ears:  Normal auditory acuity. Nose:  No deformity, discharge,  or lesions. Mouth:  No deformity or lesions.  Oropharynx pale and dry Neck:  Supple; no masses or thyromegaly. Lungs:  Clear throughout to auscultation.   No wheezes, crackles, or  rhonchi. No acute distress. Heart:  Regular rate and rhythm; no murmurs, clicks, rubs,  or gallops. Abdomen:  Soft, nontender and nondistended. No masses, hepatosplenomegaly or hernias noted. Normal bowel sounds, without guarding, and without rebound.     Msk:  Symmetrical without gross deformities. Normal posture. Pulses:  Normal pulses noted. Extremities:  Without clubbing or edema. Neurologic:  Confused; cachectic; unable to follow commands Skin:  Pale, otherwise without lesions or rashes. Cervical Nodes:  No significant cervical adenopathy. Psych:  Alert and cooperative. Normal mood and affect.   Lab Results: Recent Labs    02/02/19 1753 02/03/19 0429 02/03/19 0754  WBC 30.6*  --  29.0*  HGB 11.5* 12.0 10.0*  HCT 35.9* 37.4 30.9*  PLT 146*  --  124*   BMET Recent Labs    02/02/19 1753 02/03/19 0754  NA 138 139  K 5.2* 4.6  CL 103 105  CO2 26 24  GLUCOSE 151* 162*  BUN 29* 35*  CREATININE 1.24* 1.40*  CALCIUM 9.3 8.6*   LFT Recent Labs    02/03/19 0754  PROT 5.5*  ALBUMIN 3.0*  AST 16  ALT 12  ALKPHOS 48  BILITOT 0.7   PT/INR Recent Labs    02/03/19 0429  LABPROT 14.7  INR 1.2    Studies/Results: Ct Abdomen Pelvis Wo Contrast  Result Date: 02/03/2019 CLINICAL DATA:  Diffuse abdominal pain. Vomiting diarrhea with hematochezia. EXAM: CT ABDOMEN AND PELVIS WITHOUT CONTRAST TECHNIQUE: Multidetector CT imaging of the abdomen and pelvis was performed following the standard protocol without IV contrast. COMPARISON:  June 10, 2018 FINDINGS: Lower chest: The lung bases are clear. The heart is enlarged. Hepatobiliary: The liver is normal. Status post cholecystectomy.There is no biliary ductal dilation. Pancreas: Normal contours without ductal dilatation. No peripancreatic fluid collection. Spleen: No splenic laceration or hematoma. Adrenals/Urinary Tract: --Adrenal glands: No adrenal hemorrhage. --Right kidney/ureter: No hydronephrosis or perinephric hematoma.  --Left kidney/ureter: No hydronephrosis or perinephric hematoma. --Urinary bladder: Unremarkable. Stomach/Bowel: --Stomach/Duodenum: No hiatal hernia or other gastric abnormality. Normal duodenal course and caliber. --Small bowel: No dilatation or inflammation. --Colon: There is some wall thickening of the sigmoid colon. --Appendix: Normal. Vascular/Lymphatic: Atherosclerotic calcification is present within the non-aneurysmal abdominal aorta, without hemodynamically significant stenosis. --No retroperitoneal lymphadenopathy. --No mesenteric lymphadenopathy. --No pelvic or inguinal lymphadenopathy. Reproductive: Status post hysterectomy. No adnexal mass. Other: No ascites or free air. The abdominal wall is normal. Musculoskeletal. No acute displaced fractures. IMPRESSION: There is some wall thickening of the sigmoid colon, which could indicate diverticulitis or infectious/inflammatory colitis. Aortic Atherosclerosis (ICD10-I70.0). Electronically Signed   By: Constance Holster M.D.   On: 02/03/2019 05:16    Impression:  1.  Abdominal pain, likely diverticulitis.  Ischemic colitis or segmental colitis associated with diverticulosis ([facility]) are also possible, but seem less likely. 2.  Abnormal CT scan, sigmoid diverticulosis with suspected diverticulitis. 3.  Hematochezia,  new onset. 4.  Leukocytosis. 5.  Anemia, suspected acute blood loss. 6.  Chronic large hiatal hernia, maintained on essentially gastroparesis-type diet as outpatient. 7.  Altered mental status:  Could be from analgesics or infection or both.  Plan:  1.  Supportive care with IV fluids, antibiotics, clear liquid diet. 2.  Given patient's age and comorbidities and suspected diverticulitis, would not pursue colonoscopy. 3.  When if patient has recurrent bleeding during hospitalization, would consider tagged RBC study as next step in management. 4.  [facility] will follow.    LOS: 0 days   Jazmon Kos M  02/03/2019, 4:19 PM  Cell  [phone] If no answer or after 5 PM call [phone]

## 2019-02-03 NOTE — ED Notes (Signed)
Patient transported to CT scan . 

## 2019-02-03 NOTE — ED Notes (Signed)
Admitting MD discontinued NS 1 liter bolus.

## 2019-02-03 NOTE — H&P (Signed)
History and Physical    Mary Cline [id number] DOB: 12/22/1922 DOA: 02/02/2019  PCP: [name], MD   Patient coming from: Home   Chief Complaint: Lower abdominal pain, nausea, bloody diarrhea   HPI: Mary Cline is a 85 y.o. female with medical history significant for chronic systolic CHF with EF 20 to 25%, dementia, hypertension, and chronic kidney disease stage III, now presenting to the emergency department with 1 day of lower abdominal pain, nausea, and bloody diarrhea.  Patient is accompanied by her daughter who assists with the history.  She reportedly woke in her usual state of health but shortly after breakfast developed severe nausea.  She went on to have diarrhea and was complaining of pain across her lower abdomen.  The third episode of diarrhea contained bright red blood per the daughter's report and the fourth episode was mainly bright red blood.  Patient has not been coughing, has not appeared dyspneic, and has not been complaining of any chest pain.  ED Course: Upon arrival to the ED, patient is found to be afebrile, saturating well on room air, and with stable blood pressure.  EKG features sinus rhythm with chronic LBBB.  Chemistry panel is notable for creatinine 1.24, with baseline apparently 1.1.  CBC features a leukocytosis to 30,600 and a mild normocytic anemia.  CT the abdomen and pelvis reveals sigmoid thickening concerning for diverticulitis.  Blood and urine cultures were collected, stool studies ordered, 0.5 L normal saline administered, and the patient was started on Rocephin and Flagyl.  Review of Systems:  All other systems reviewed and apart from HPI, are negative.  Past Medical History:  Diagnosis Date   Anemia    Arthritis    Chronic systolic heart failure ([place]) 04/03/2015   Dementia ([place])    Diabetes mellitus    Essential hypertension 04/03/2015   GERD (gastroesophageal reflux disease)    Hernia    Hyperlipidemia 04/03/2015    Hypertension    Macular degeneration disease     Past Surgical History:  Procedure Laterality Date   ANGIOPLASTY     APPENDECTOMY     HYSTEROTOMY       reports that she has never smoked. She has never used smokeless tobacco. She reports that she does not drink alcohol or use drugs.  Allergies  Allergen Reactions   Fish Allergy Anaphylaxis   Iodine Anaphylaxis   Actonel [Risedronate Sodium] Nausea And Vomiting   Atorvastatin Other (See Comments)    Blisters in mouth.   Blisters in mouth.     Ciprofloxacin Nausea And Vomiting   Codeine Other (See Comments)    Headache  Headache   Corticotropin Other (See Comments)    Confusion  Confusion   Demeclocycline     Stomach pain   Dihydrotachysterol     Dizzy confused   Garamycin [Gentamicin Sulfate] Rash   Lactose Intolerance (Gi) Other (See Comments)    Intolerance.     Lactulose     Intolerance.     Lovastatin Other (See Comments)    blisters blisters   Macrobid [Nitrofurantoin Monohyd Macro] Nausea And Vomiting   Meperidine Other (See Comments)    Hypotension  Hypotension   Other Hives and Swelling    Melons and strawberries.     Paxil [Paroxetine Hcl] Nausea Only   Penicillins Other (See Comments)    Did it involve swelling of the face/tongue/throat, SOB, or low BP? No Did it involve sudden or severe rash/hives, skin peeling, or any reaction  on the inside of your mouth or nose? Unknown Did you need to seek medical attention at a hospital or doctor's office? Unknown When did it last happen?Childhood If all above answers are NO, may proceed with cephalosporin use. Pain.     Rofecoxib Other (See Comments)    Not known Not known   Sulfa Antibiotics Swelling   Tetracyclines & Related Other (See Comments)    Stomach pain   Vitamin D Analogs Other (See Comments)    Dizzy confused    Family History  Problem Relation Age of Onset   Cancer Sister        Breast cancer    Diabetes Brother    Diabetes Maternal Grandmother    Heart Problems Maternal Grandfather    Heart Problems Paternal Grandmother    Heart Problems Paternal Grandfather    Diabetes Brother    Diabetes Brother    Heart Problems Daughter    Heart Problems Daughter      Prior to Admission medications   Medication Sig Start Date End Date Taking? Authorizing Provider  aspirin 81 MG tablet Take 81 mg by mouth daily.    [provider]  carvedilol (COREG) 6.25 MG tablet TAKE 1 TABLET TWICE DAILY 11/04/18   Skeet Latch, MD  Cyanocobalamin (VITAMIN B 12) 500 MCG TABS Take 500 mcg by mouth every Monday, Wednesday, and Friday.     [provider]  ezetimibe (ZETIA) 10 MG tablet Take 10 mg by mouth daily.    [provider]  furosemide (LASIX) 20 MG tablet Take 1 tablet (20 mg total) by mouth as needed. For weight gain more that 2 pounds in a day or 5 pounds in a week. 10/09/16   Skeet Latch, MD  glycerin adult 2 G SUPP Place 1 suppository rectally once as needed for moderate constipation.    [provider]  ibandronate (BONIVA) 150 MG tablet Take 150 mg by mouth every 30 (thirty) days. Take in the morning with a full glass of water, on an empty stomach, and do not take anything else by mouth or lie down for the next 30 min.    [provider]  irbesartan (AVAPRO) 150 MG tablet Take 1 tablet (150 mg total) daily by mouth. 02/16/17   Skeet Latch, MD  mirtazapine (REMERON) 15 MG tablet TAKE 1 TABLET BY MOUTH ONCE DAILY AT BEDTIME FOR 30 DAYS 08/24/18   [provider]  Multiple Vitamin (MULTIVITAMIN) tablet Take 1 tablet by mouth daily.    [provider]  NONFORMULARY OR COMPOUNDED ITEM Apply 1 application topically as needed (apply to bottom area). Ketoconazole and fluticasone compounded cream.  Apply as needed to affected areas as needed as direct    [provider]  omeprazole (PRILOSEC) 40 MG capsule Take 40  mg by mouth daily.    [provider]  ondansetron (ZOFRAN) 4 MG tablet Take 1 tablet (4 mg total) by mouth every 8 (eight) hours as needed for nausea or vomiting. 06/10/18   Hayden Rasmussen, MD  spironolactone (ALDACTONE) 25 MG tablet Take 0.5 tablets (12.5 mg total) by mouth daily. *NEEDS OFFICE VISIT* 11/30/18   Skeet Latch, MD  sucralfate (CARAFATE) 1 g tablet Take 1 tablet (1 g total) by mouth 4 (four) times daily -  with meals and at bedtime. 06/10/18   Hayden Rasmussen, MD  traMADol (ULTRAM) 50 MG tablet Take 1 tablet (50 mg total) by mouth 3 (three) times daily as needed for severe pain.  09/04/14   Carmin Muskrat, MD  Vitamin D, Ergocalciferol, (DRISDOL) 1.25 MG (50000 UT) CAPS capsule TAKE 1 CAPSULE BY MOUTH ONCE A WEEK FOR 90 DAYS 06/28/18   [provider]    Physical Exam: Vitals:   02/02/19 1733 02/02/19 1925 02/02/19 2240 02/03/19 0430  BP: 127/71 120/62 113/67 (!) 121/58  Pulse: 90 88 93 87  Resp: 16 14 16 20   Temp: 98.1 F (36.7 C) 99.3 F (37.4 C)    TempSrc:  Oral    SpO2: 93% 95% 93% 94%    Constitutional: NAD, calm  Eyes: PERTLA, lids and conjunctivae normal ENMT: Mucous membranes are moist. Posterior pharynx clear of any exudate or lesions.   Neck: normal, supple, no masses, no thyromegaly Respiratory: no wheezing, no crackles. Normal respiratory effort. No accessory muscle use.  Cardiovascular: S1 & S2 heard, regular rate and rhythm. Trace pedal edema bilaterally. Abdomen: No distension, tender across lower abdomen, no rebound pain or guarding. Bowel sounds active.  Musculoskeletal: no clubbing / cyanosis. No joint deformity upper and lower extremities.    Skin: no significant rashes, lesions, ulcers. Warm, dry, well-perfused. Neurologic: No facial asymmetry. Gross hearing deficit. Sensation intact. Moving all extremities.  Psychiatric: Alert and oriented to person and place only. Pleasant, cooperative.    Labs on Admission: I have  personally reviewed following labs and imaging studies  CBC: Recent Labs  Lab 02/02/19 1753 02/03/19 0429  WBC 30.6*  --   HGB 11.5* 12.0  HCT 35.9* 37.4  MCV 99.7  --   PLT 146*  --    Basic Metabolic Panel: Recent Labs  Lab 02/02/19 1753  NA 138  K 5.2*  CL 103  CO2 26  GLUCOSE 151*  BUN 29*  CREATININE 1.24*  CALCIUM 9.3   GFR: CrCl cannot be calculated (Unknown ideal weight.). Liver Function Tests: Recent Labs  Lab 02/02/19 1753  AST 18  ALT 12  ALKPHOS 61  BILITOT 0.9  PROT 6.1*  ALBUMIN 3.5   Recent Labs  Lab 02/02/19 1753  LIPASE 34   No results for input(s): AMMONIA in the last 168 hours. Coagulation Profile: Recent Labs  Lab 02/03/19 0429  INR 1.2   Cardiac Enzymes: No results for input(s): CKTOTAL, CKMB, CKMBINDEX, TROPONINI in the last 168 hours. BNP (last 3 results) No results for input(s): PROBNP in the last 8760 hours. HbA1C: No results for input(s): HGBA1C in the last 72 hours. CBG: No results for input(s): GLUCAP in the last 168 hours. Lipid Profile: No results for input(s): CHOL, HDL, LDLCALC, TRIG, CHOLHDL, LDLDIRECT in the last 72 hours. Thyroid Function Tests: No results for input(s): TSH, T4TOTAL, FREET4, T3FREE, THYROIDAB in the last 72 hours. Anemia Panel: No results for input(s): VITAMINB12, FOLATE, FERRITIN, TIBC, IRON, RETICCTPCT in the last 72 hours. Urine analysis:    Component Value Date/Time   COLORURINE AMBER (A) 02/02/2019 2124   APPEARANCEUR HAZY (A) 02/02/2019 2124   LABSPEC 1.017 02/02/2019 2124   PHURINE 5.0 02/02/2019 2124   GLUCOSEU NEGATIVE 02/02/2019 2124   HGBUR SMALL (A) 02/02/2019 2124   BILIRUBINUR NEGATIVE 02/02/2019 2124   KETONESUR 5 (A) 02/02/2019 2124   PROTEINUR 30 (A) 02/02/2019 2124   UROBILINOGEN 0.2 09/16/2011 1940   NITRITE NEGATIVE 02/02/2019 2124   LEUKOCYTESUR MODERATE (A) 02/02/2019 2124   Sepsis Labs: @LABRCNTIP (procalcitonin:4,lacticidven:4) )No results found for this or any  previous visit (from the past 240 hour(s)).   Radiological Exams on Admission: Ct Abdomen Pelvis Wo Contrast  Result Date:  02/03/2019 CLINICAL DATA:  Diffuse abdominal pain. Vomiting diarrhea with hematochezia. EXAM: CT ABDOMEN AND PELVIS WITHOUT CONTRAST TECHNIQUE: Multidetector CT imaging of the abdomen and pelvis was performed following the standard protocol without IV contrast. COMPARISON:  June 10, 2018 FINDINGS: Lower chest: The lung bases are clear. The heart is enlarged. Hepatobiliary: The liver is normal. Status post cholecystectomy.There is no biliary ductal dilation. Pancreas: Normal contours without ductal dilatation. No peripancreatic fluid collection. Spleen: No splenic laceration or hematoma. Adrenals/Urinary Tract: --Adrenal glands: No adrenal hemorrhage. --Right kidney/ureter: No hydronephrosis or perinephric hematoma. --Left kidney/ureter: No hydronephrosis or perinephric hematoma. --Urinary bladder: Unremarkable. Stomach/Bowel: --Stomach/Duodenum: No hiatal hernia or other gastric abnormality. Normal duodenal course and caliber. --Small bowel: No dilatation or inflammation. --Colon: There is some wall thickening of the sigmoid colon. --Appendix: Normal. Vascular/Lymphatic: Atherosclerotic calcification is present within the non-aneurysmal abdominal aorta, without hemodynamically significant stenosis. --No retroperitoneal lymphadenopathy. --No mesenteric lymphadenopathy. --No pelvic or inguinal lymphadenopathy. Reproductive: Status post hysterectomy. No adnexal mass. Other: No ascites or free air. The abdominal wall is normal. Musculoskeletal. No acute displaced fractures. IMPRESSION: There is some wall thickening of the sigmoid colon, which could indicate diverticulitis or infectious/inflammatory colitis. Aortic Atherosclerosis (ICD10-I70.0). Electronically Signed   By: Constance Holster M.D.   On: 02/03/2019 05:16    EKG: Independently reviewed. Sinus rhythm, chronic LBBB.    Assessment/Plan   1. Sigmoid diverticulitis with bleeding  - Presents with one day of lower abdominal pain, N/V, and bloody diarrhea  - CT findings raise concern for sigmoid diverticulitis  - Blood cultures were collected in ED, stool studies were ordered, 500 cc NS bolus was given, and the patient was started on empiric antibiotics with Rocephin and Flagyl  - Continue current antibiotics, continue bowel rest, type & screen, follow H&H, follow cultures and clinical course   2. Chronic systolic CHF  - EF was 0000000 on TTE from March 2020  - Appears compensated   - Hold diuretics while NPO, follow daily wt and I/O's    3. CKD stage III  - SCr is 1.24 on admission with apparent baseline of ~1.1  - Renally-dose medications, monitor     PPE: Mask, face shield  DVT prophylaxis: SCD's  Code Status: Full, confirmed with daughter  Family Communication: Daughter updated at bedside Consults called: None  Admission status: Inpatient. Patient has acute infection complicated by GI bleeding, is at increased risk for life threatening complications due to age of 36 years and CHF with EF only 20-25% and will require careful inpatient management.     Vianne Bulls, MD [facility] Pager [phone]  If 7PM-7AM, please contact night-coverage [email or web address] Password [email or web address]  02/03/2019, 6:04 AM

## 2019-02-03 NOTE — ED Notes (Signed)
ED TO INPATIENT HANDOFF REPORT  ED Nurse Name and Phone #:  [phone]  S Name/Age/Gender Mary Cline 85 y.o. female Room/Bed: 016C/016C  Code Status   Code Status: Full Code  Home/SNF/Other Home Patient oriented to person/plae   Triage Complete: Triage complete  Chief Complaint abd pain blood in stool/ diarrhea  Triage Note C/o nausea, vomiting, diarrhea, and lower abd pain since this morning.  Last diarrhea had bright red blood.   Allergies Allergies  Allergen Reactions  . Fish Allergy Anaphylaxis  . Iodine Anaphylaxis  . Actonel [Risedronate Sodium] Nausea And Vomiting  . Atorvastatin Other (See Comments)    Blisters in mouth.   Blisters in mouth.    . Ciprofloxacin Nausea And Vomiting  . Codeine Other (See Comments)    Headache  Headache  . Corticotropin Other (See Comments)    Confusion  Confusion  . Demeclocycline     Stomach pain  . Dihydrotachysterol     Dizzy confused  . Garamycin [Gentamicin Sulfate] Rash  . Lactose Intolerance (Gi) Other (See Comments)    Intolerance.    . Lactulose     Intolerance.    . Lovastatin Other (See Comments)    blisters blisters  . Macrobid [Nitrofurantoin Monohyd Macro] Nausea And Vomiting  . Meperidine Other (See Comments)    Hypotension  Hypotension  . Other Hives and Swelling    Melons and strawberries.    . Paxil [Paroxetine Hcl] Nausea Only  . Penicillins Other (See Comments)    Did it involve swelling of the face/tongue/throat, SOB, or low BP? No Did it involve sudden or severe rash/hives, skin peeling, or any reaction on the inside of your mouth or nose? Unknown Did you need to seek medical attention at a hospital or doctor's office? Unknown When did it last happen?Childhood If all above answers are "NO", may proceed with cephalosporin use. Pain.    . Rofecoxib Other (See Comments)    Not known Not known  . Sulfa Antibiotics Swelling  . Tetracyclines & Related Other (See Comments)     Stomach pain  . Vitamin D Analogs Other (See Comments)    Dizzy confused    Level of Care/Admitting Diagnosis ED Disposition    ED Disposition Condition [facility] Area: [place] [[id number]]  Level of Care: Telemetry Medical [104]  Covid Evaluation: Asymptomatic Screening Protocol (No Symptoms)  Diagnosis: Diverticulitis of colon with bleeding [id number]  Admitting Physician: Vianne Bulls [id number]  Attending Physician: Vianne Bulls [id number]  Estimated length of stay: past midnight tomorrow  Certification:: I certify this patient will need inpatient services for at least 2 midnights  PT Class (Do Not Modify): Inpatient [101]  PT Acc Code (Do Not Modify): Private [1]       B Medical/Surgery History Past Medical History:  Diagnosis Date  . Anemia   . Arthritis   . Chronic systolic heart failure ([place]) 04/03/2015  . Dementia ([place])   . Diabetes mellitus   . Essential hypertension 04/03/2015  . GERD (gastroesophageal reflux disease)   . Hernia   . Hyperlipidemia 04/03/2015  . Hypertension   . Macular degeneration disease    Past Surgical History:  Procedure Laterality Date  . ANGIOPLASTY    . APPENDECTOMY    . HYSTEROTOMY       A IV Location/Drains/Wounds Patient Lines/Drains/Airways Status   Active Line/Drains/Airways    Name:   Placement date:   Placement time:   Site:  Days:   Peripheral IV 02/03/19 Left Forearm   02/03/19    0431    Forearm   less than 1   Peripheral IV 02/03/19 Right Forearm   02/03/19    0509    Forearm   less than 1          Intake/Output Last 24 hours No intake or output data in the 24 hours ending 02/03/19 [id number]  Labs/Imaging Results for orders placed or performed during the hospital encounter of 02/02/19 (from the past 48 hour(s))  Type and screen [place]     Status: None   Collection Time: 02/02/19  5:50 PM  Result Value Ref Range   ABO/RH(D) B POS    Antibody Screen NEG     Sample Expiration      02/05/2019,2359 Performed at [facility], [place] [address] [place], [place] [address]   ABO/Rh     Status: None   Collection Time: 02/02/19  5:50 PM  Result Value Ref Range   ABO/RH(D)      B POS Performed at [facility] [address], [place], [place] [address]   Lipase, blood     Status: None   Collection Time: 02/02/19  5:53 PM  Result Value Ref Range   Lipase 34 11 - 51 U/L    Comment: Performed at [facility] [address] [place], [place] [address]  Comprehensive metabolic panel     Status: Abnormal   Collection Time: 02/02/19  5:53 PM  Result Value Ref Range   Sodium 138 135 - 145 mmol/L   Potassium 5.2 (H) 3.5 - 5.1 mmol/L   Chloride 103 98 - 111 mmol/L   CO2 26 22 - 32 mmol/L   Glucose, Bld 151 (H) 70 - 99 mg/dL   BUN 29 (H) 8 - 23 mg/dL   Creatinine, Ser 1.24 (H) 0.44 - 1.00 mg/dL   Calcium 9.3 8.9 - 10.3 mg/dL   Total Protein 6.1 (L) 6.5 - 8.1 g/dL   Albumin 3.5 3.5 - 5.0 g/dL   AST 18 15 - 41 U/L   ALT 12 0 - 44 U/L   Alkaline Phosphatase 61 38 - 126 U/L   Total Bilirubin 0.9 0.3 - 1.2 mg/dL   GFR calc non Af Amer 37 (L) >60 mL/min   GFR calc Af Amer 43 (L) >60 mL/min   Anion gap 9 5 - 15    Comment: Performed at [facility] [address] [place],  [address]  CBC     Status: Abnormal   Collection Time: 02/02/19  5:53 PM  Result Value Ref Range   WBC 30.6 (H) 4.0 - 10.5 K/uL   RBC 3.60 (L) 3.87 - 5.11 MIL/uL   Hemoglobin 11.5 (L) 12.0 - 15.0 g/dL   HCT 35.9 (L) 36.0 - 46.0 %   MCV 99.7 80.0 - 100.0 fL   MCH 31.9 26.0 - 34.0 pg   MCHC 32.0 30.0 - 36.0 g/dL   RDW 11.9 11.5 - 15.5 %   Platelets 146 (L) 150 - 400 K/uL   nRBC 0.0 0.0 - 0.2 %    Comment: Performed at [facility], [place] [address], [place],  [address]  Urinalysis, Routine w reflex microscopic     Status: Abnormal   Collection Time: 02/02/19  9:24 PM  Result Value Ref Range   Color, Urine AMBER (A) YELLOW    Comment:  BIOCHEMICALS MAY  BE AFFECTED BY COLOR   APPearance HAZY (A) CLEAR   Specific Gravity, Urine 1.017 1.005 - 1.030   pH 5.0 5.0 - 8.0   Glucose, UA NEGATIVE NEGATIVE mg/dL   Hgb urine dipstick SMALL (A) NEGATIVE   Bilirubin Urine NEGATIVE NEGATIVE   Ketones, ur 5 (A) NEGATIVE mg/dL   Protein, ur 30 (A) NEGATIVE mg/dL   Nitrite NEGATIVE NEGATIVE   Leukocytes,Ua MODERATE (A) NEGATIVE   RBC / HPF 6-10 0 - 5 RBC/hpf   WBC, UA >50 (H) 0 - 5 WBC/hpf   Bacteria, UA RARE (A) NONE SEEN   Mucus PRESENT    Hyaline Casts, UA PRESENT     Comment: Performed at [facility] [address], [place], [place] [address]  Lactic acid, plasma     Status: Abnormal   Collection Time: 02/03/19  4:26 AM  Result Value Ref Range   Lactic Acid, Venous 2.7 (HH) 0.5 - 1.9 mmol/L    Comment: CRITICAL RESULT CALLED TO, READ BACK BY AND VERIFIED WITH: Gevena Mart L,RN 02/03/19 0512 WAYK Performed at [facility], [place] [address], [place], [place] [address]   Hemoglobin and hematocrit, blood     Status: None   Collection Time: 02/03/19  4:29 AM  Result Value Ref Range   Hemoglobin 12.0 12.0 - 15.0 g/dL   HCT 37.4 36.0 - 46.0 %    Comment: Performed at [facility], [place] [address], [place], [place] [address]  PT/INR     Status: None   Collection Time: 02/03/19  4:29 AM  Result Value Ref Range   Prothrombin Time 14.7 11.4 - 15.2 seconds   INR 1.2 0.8 - 1.2    Comment: (NOTE) INR goal varies based on device and disease states. Performed at [facility], [place] [address] [place], [place] [address]   Troponin I (High Sensitivity)     Status: Abnormal   Collection Time: 02/03/19  4:29 AM  Result Value Ref Range   Troponin I (High Sensitivity) 26 (H) <18 ng/L    Comment: (NOTE) Elevated high sensitivity troponin I (hsTnI) values and significant  changes across serial measurements may suggest ACS but many other  chronic and acute conditions are known to elevate hsTnI results.  Refer to the  "Links" section for chest pain algorithms and additional  guidance. Performed at [facility], [place] [address], [place], [place] [address]   POC occult blood, ED     Status: Abnormal   Collection Time: 02/03/19  4:46 AM  Result Value Ref Range   Fecal Occult Bld POSITIVE (A) NEGATIVE   Ct Abdomen Pelvis Wo Contrast  Result Date: 02/03/2019 CLINICAL DATA:  Diffuse abdominal pain. Vomiting diarrhea with hematochezia. EXAM: CT ABDOMEN AND PELVIS WITHOUT CONTRAST TECHNIQUE: Multidetector CT imaging of the abdomen and pelvis was performed following the standard protocol without IV contrast. COMPARISON:  June 10, 2018 FINDINGS: Lower chest: The lung bases are clear. The heart is enlarged. Hepatobiliary: The liver is normal. Status post cholecystectomy.There is no biliary ductal dilation. Pancreas: Normal contours without ductal dilatation. No peripancreatic fluid collection. Spleen: No splenic laceration or hematoma. Adrenals/Urinary Tract: --Adrenal glands: No adrenal hemorrhage. --Right kidney/ureter: No hydronephrosis or perinephric hematoma. --Left kidney/ureter: No hydronephrosis or perinephric hematoma. --Urinary bladder: Unremarkable. Stomach/Bowel: --Stomach/Duodenum: No hiatal hernia or other gastric abnormality. Normal duodenal course and caliber. --Small bowel: No dilatation or inflammation. --Colon: There is some wall thickening of the sigmoid colon. --Appendix: Normal. Vascular/Lymphatic:  Atherosclerotic calcification is present within the non-aneurysmal abdominal aorta, without hemodynamically significant stenosis. --No retroperitoneal lymphadenopathy. --No mesenteric lymphadenopathy. --No pelvic or inguinal lymphadenopathy. Reproductive: Status post hysterectomy. No adnexal mass. Other: No ascites or free air. The abdominal wall is normal. Musculoskeletal. No acute displaced fractures. IMPRESSION: There is some wall thickening of the sigmoid colon, which could indicate diverticulitis or  infectious/inflammatory colitis. Aortic Atherosclerosis (ICD10-I70.0). Electronically Signed   By: Constance Holster M.D.   On: 02/03/2019 05:16    Pending Labs Unresulted Labs (From admission, onward)    Start     Ordered   02/03/19 1700  Hemoglobin  Once-Timed,   STAT     02/03/19 0629   02/03/19 1700  Hematocrit  Once-Timed,   STAT     02/03/19 0629   02/03/19 0530  C Difficile Quick Screen w PCR reflex  Once,   STAT     02/03/19 0530   02/03/19 0433  Gastrointestinal Panel by PCR , Stool  (Gastrointestinal Panel by PCR, Stool                                                                                                                                                     *Does Not include CLOSTRIDIUM DIFFICILE testing.**If CDIFF testing is needed, select the C Difficile Quick Screen w PCR reflex order below)  ONCE - STAT,   STAT     02/03/19 0432   02/03/19 0432  Blood culture (routine x 2)  BLOOD CULTURE X 2,   STAT     02/03/19 0431   02/03/19 0431  Urine culture  ONCE - STAT,   STAT     02/03/19 0430   02/03/19 0431  SARS CORONAVIRUS 2 (TAT 6-24 HRS) Nasopharyngeal Nasopharyngeal Swab  (Asymptomatic/Tier 2 Patients Labs)  ONCE - STAT,   STAT    Question Answer Comment  Is this test for diagnosis or screening Screening   Symptomatic for COVID-19 as defined by CDC No   Hospitalized for COVID-19 No   Admitted to ICU for COVID-19 No   Previously tested for COVID-19 No   Resident in a congregate (group) care setting No   Employed in healthcare setting No   Pregnant No      02/03/19 0430   02/03/19 0426  Lactic acid, plasma  Now then every 2 hours,   STAT     02/03/19 0426   Signed and Held  Comprehensive metabolic panel  Tomorrow morning,   R     Signed and Held   Signed and Held  CBC WITH DIFFERENTIAL  Tomorrow morning,   R     Signed and Held          Vitals/Pain Today's Vitals   02/03/19 0600 02/03/19 0621 02/03/19 0630 02/03/19 0631  BP: (!) 118/58  (!) 119/55  Pulse: 90     Resp: 16  15   Temp:      TempSrc:      SpO2: 97%     PainSc:  0-No pain  0-No pain    Isolation Precautions Enteric precautions (UV disinfection)  Medications Medications  sodium chloride flush (NS) 0.9 % injection 3 mL (3 mLs Intravenous Not Given 02/03/19 0446)  metroNIDAZOLE (FLAGYL) IVPB 500 mg (500 mg Intravenous New Bag/Given 02/03/19 0621)  0.9 %  sodium chloride infusion (has no administration in time range)  ondansetron (ZOFRAN) tablet 4 mg (has no administration in time range)    Or  ondansetron (ZOFRAN) injection 4 mg (has no administration in time range)  acetaminophen (TYLENOL) tablet 650 mg (has no administration in time range)    Or  acetaminophen (TYLENOL) suppository 650 mg (has no administration in time range)  HYDROcodone-acetaminophen (NORCO/VICODIN) 5-325 MG per tablet 1 tablet (has no administration in time range)  cefTRIAXone (ROCEPHIN) 1 g in sodium chloride 0.9 % 100 mL IVPB (has no administration in time range)  metroNIDAZOLE (FLAGYL) IVPB 500 mg (has no administration in time range)  fentaNYL (SUBLIMAZE) injection 50 mcg (has no administration in time range)  sodium chloride 0.9 % bolus 500 mL (0 mLs Intravenous Stopped 02/03/19 0611)  fentaNYL (SUBLIMAZE) injection 50 mcg (50 mcg Intravenous Given 02/03/19 0533)  ondansetron (ZOFRAN) injection 4 mg (4 mg Intravenous Given 02/03/19 0533)  cefTRIAXone (ROCEPHIN) 1 g in sodium chloride 0.9 % 100 mL IVPB (0 g Intravenous Stopped 02/03/19 0611)  sodium chloride 0.9 % bolus 1,000 mL (0 mLs Intravenous Stopped 02/03/19 0622)    Mobility walks with device Low fall risk   Focused Assessments Daughter at bedside.   R Recommendations: See Admitting Provider Note  Report given to:   Additional Notes:

## 2019-02-03 NOTE — ED Notes (Signed)
Lunch Tray Ordered @ 1031.

## 2019-02-03 NOTE — Progress Notes (Signed)
PROGRESS NOTE    Mary Cline  [id number] DOB: 04/27/1922 DOA: 02/02/2019 PCP: [name], MD   Brief Narrative: 85 year old female with chronic systolic CHF EF 20 to [id number], dementia, hypertension, CKD stage III presented to the ER 1 day history of lower abdominal pain, nausea, bloody diarrhea. In ER afebrile saturating well blood pressure stable lab with creatinine 1.2, WBC 30 K, CT abdomen abdomen and pelvis reveals sigmoid thickening concerning for diverticulitis.  Blood and urine cultures were collected, stool studies ordered,was started on Rocephin and Flagyl and was admitted for further management.  Subjective: Daughter at the bedside, patient appears very forgetful and more confused from baseline.Has mild baseline memory issues but this is worse. Uses walker and needs help in and out of bed. Having lots of pain since yesterday am. Blood in stool x2 days- bright. 4 bms yesterday- last 2 had blood Colonoscopy in lat couple of years per daughter by Dr Paulita Fujita and on special diet Pain better this am. Has had nausea/vomiting ND BM this am around 4.30  Assessment & Plan:  Sigmoid diverticulitis with bleeding: Last  by movement in the ER was slimy per the daughter.  Her hemoglobin is stable.  FOBT positive, followed by Dr. Paulita Fujita and her last colonoscopy "couple of years" ago per daughter.  Continue ceftriaxone, Flagyl serial H&H.  Follow-up on culture data.  Hold patient's aspirin. Known to Dr Paulita Fujita, we will d/w him and consult him.cont CLD.  Anemia OF chronic disease/enal disease with Drop 11.5-10, likely from GI bleed as above.  Monitor closely transfuse as needed. Recent Labs  Lab 02/02/19 1753 02/03/19 0429 02/03/19 0754  HGB 11.5* 12.0 10.0*  HCT 35.9* 37.4 30.9*    Sepsis with leukocytosis, lactic acidosis 1.  Lactic acidosis resolved, leukocytosis downtrending but is still very high.  Monitor CBC and continue antibiotics as above.  Chronic systolic heart  failure, EF 20 to 25% in echo March 2020, compensated continue to hold diuretics while n.p.o. monitor intake output and watch for fluid overload.  Home Coreg Aldactone, restarted on hold.  CKD, stage III a: Baseline creatinine 1- 1.2: Continue gentle flui. Monitor labs. Recent Labs  Lab 02/02/19 1753 02/03/19 0754  BUN 29* 35*  CREATININE 1.24* 1.40*   Essential hypertension: Blood pressure controlled hold irbesartan/Coreg.  There is no height or weight on file to calculate BMI.    DVT prophylaxis: SCD Code Status: FULL.  Discussed with patient's daughter she is going to talk with her sister and confirm her CODE STATUS,. Family Communication: plan of care discussed with patient's daughter at bedside. Disposition Plan: Remains inpatient pending clinical improvement.  Consultants: none  Procedures: none  Microbiology: [id number] negative. C Diff  Negative,  Antimicrobials: Anti-infectives (From admission, onward)   Start     Dose/Rate Route Frequency Ordered Stop   02/04/19 0445  cefTRIAXone (ROCEPHIN) 2 g in sodium chloride 0.9 % 100 mL IVPB     2 g 200 mL/hr over 30 Minutes Intravenous Every 24 hours 02/03/19 0629     02/03/19 1400  metroNIDAZOLE (FLAGYL) IVPB 500 mg     500 mg 100 mL/hr over 60 Minutes Intravenous Every 8 hours 02/03/19 0629     02/03/19 0545  metroNIDAZOLE (FLAGYL) IVPB 500 mg     500 mg 100 mL/hr over 60 Minutes Intravenous  Once 02/03/19 0541 02/03/19 0712   02/03/19 0445  cefTRIAXone (ROCEPHIN) 1 g in sodium chloride 0.9 % 100 mL IVPB     1 g 200  mL/hr over 30 Minutes Intravenous  Once 02/03/19 0438 02/03/19 0611       Objective: Vitals:   02/03/19 0530 02/03/19 0600 02/03/19 0630 02/03/19 0700  BP: (!) 104/52 (!) 118/58 (!) 119/55 105/60  Pulse: 79 90  80  Resp: 17 16 15 18   Temp:      TempSrc:      SpO2: 95% 97%  (!) 88%   No intake or output data in the 24 hours ending 02/03/19 0727 There were no vitals filed for this visit. Weight  change:   There is no height or weight on file to calculate BMI.  Intake/Output from previous day: No intake/output data recorded. Intake/Output this shift: No intake/output data recorded.  Examination:  General exam: AAO x2,NAD,frail, ill and Weak appearing. HEENT:Oral mucosa moist, Ear/Nose WNL grossly, dentition normal. Respiratory system: Diminished at the base,no wheezing or crackles,no use of accessory muscle Cardiovascular system: S1 & S2 +, No JVD,. Gastrointestinal system: Abdomen soft, Tender on lower abdomen,ND, BS+ Nervous System:Alert, awake, moving extremities and grossly nonfocal Extremities: No edema, distal peripheral pulses palpable.  Skin: No rashes,no icterus. MSK: Normal muscle bulk,tone, power  Medications:  Scheduled Meds: . sodium chloride flush  3 mL Intravenous Once   Continuous Infusions: . sodium chloride    . [START ON 02/04/2019] cefTRIAXone (ROCEPHIN)  IV    . metronidazole      Data Reviewed: I have personally reviewed following labs and imaging studies  CBC: Recent Labs  Lab 02/02/19 1753 02/03/19 0429  WBC 30.6*  --   HGB 11.5* 12.0  HCT 35.9* 37.4  MCV 99.7  --   PLT 146*  --    Basic Metabolic Panel: Recent Labs  Lab 02/02/19 1753  NA 138  K 5.2*  CL 103  CO2 26  GLUCOSE 151*  BUN 29*  CREATININE 1.24*  CALCIUM 9.3   GFR: CrCl cannot be calculated (Unknown ideal weight.). Liver Function Tests: Recent Labs  Lab 02/02/19 1753  AST 18  ALT 12  ALKPHOS 61  BILITOT 0.9  PROT 6.1*  ALBUMIN 3.5   Recent Labs  Lab 02/02/19 1753  LIPASE 34   No results for input(s): AMMONIA in the last 168 hours. Coagulation Profile: Recent Labs  Lab 02/03/19 0429  INR 1.2   Cardiac Enzymes: No results for input(s): CKTOTAL, CKMB, CKMBINDEX, TROPONINI in the last 168 hours. BNP (last 3 results) No results for input(s): PROBNP in the last 8760 hours. HbA1C: No results for input(s): HGBA1C in the last 72 hours. CBG: No  results for input(s): GLUCAP in the last 168 hours. Lipid Profile: No results for input(s): CHOL, HDL, LDLCALC, TRIG, CHOLHDL, LDLDIRECT in the last 72 hours. Thyroid Function Tests: No results for input(s): TSH, T4TOTAL, FREET4, T3FREE, THYROIDAB in the last 72 hours. Anemia Panel: No results for input(s): VITAMINB12, FOLATE, FERRITIN, TIBC, IRON, RETICCTPCT in the last 72 hours. Sepsis Labs: Recent Labs  Lab 02/03/19 0426 02/03/19 0628  LATICACIDVEN 2.7* 1.9    No results found for this or any previous visit (from the past 240 hour(s)).    Radiology Studies: Ct Abdomen Pelvis Wo Contrast  Result Date: 02/03/2019 CLINICAL DATA:  Diffuse abdominal pain. Vomiting diarrhea with hematochezia. EXAM: CT ABDOMEN AND PELVIS WITHOUT CONTRAST TECHNIQUE: Multidetector CT imaging of the abdomen and pelvis was performed following the standard protocol without IV contrast. COMPARISON:  June 10, 2018 FINDINGS: Lower chest: The lung bases are clear. The heart is enlarged. Hepatobiliary: The liver is normal. Status  post cholecystectomy.There is no biliary ductal dilation. Pancreas: Normal contours without ductal dilatation. No peripancreatic fluid collection. Spleen: No splenic laceration or hematoma. Adrenals/Urinary Tract: --Adrenal glands: No adrenal hemorrhage. --Right kidney/ureter: No hydronephrosis or perinephric hematoma. --Left kidney/ureter: No hydronephrosis or perinephric hematoma. --Urinary bladder: Unremarkable. Stomach/Bowel: --Stomach/Duodenum: No hiatal hernia or other gastric abnormality. Normal duodenal course and caliber. --Small bowel: No dilatation or inflammation. --Colon: There is some wall thickening of the sigmoid colon. --Appendix: Normal. Vascular/Lymphatic: Atherosclerotic calcification is present within the non-aneurysmal abdominal aorta, without hemodynamically significant stenosis. --No retroperitoneal lymphadenopathy. --No mesenteric lymphadenopathy. --No pelvic or inguinal  lymphadenopathy. Reproductive: Status post hysterectomy. No adnexal mass. Other: No ascites or free air. The abdominal wall is normal. Musculoskeletal. No acute displaced fractures. IMPRESSION: There is some wall thickening of the sigmoid colon, which could indicate diverticulitis or infectious/inflammatory colitis. Aortic Atherosclerosis (ICD10-I70.0). Electronically Signed   By: Constance Holster M.D.   On: 02/03/2019 05:16      LOS: 0 days   Time spent: More than 50% of that time was spent in counseling and/or coordination of care.  Antonieta Pert, MD [facility]  02/03/2019, 7:27 AM

## 2019-02-03 NOTE — ED Notes (Signed)
Dinner Tray Ordered @ 1803.  

## 2019-02-04 LAB — COMPREHENSIVE METABOLIC PANEL
ALT: 12 U/L (ref 0–44)
AST: 21 U/L (ref 15–41)
Albumin: 2.7 g/dL — ABNORMAL LOW (ref 3.5–5.0)
Alkaline Phosphatase: 40 U/L (ref 38–126)
Anion gap: 7 (ref 5–15)
BUN: 35 mg/dL — ABNORMAL HIGH (ref 8–23)
CO2: 24 mmol/L (ref 22–32)
Calcium: 8.1 mg/dL — ABNORMAL LOW (ref 8.9–10.3)
Chloride: 107 mmol/L (ref 98–111)
Creatinine, Ser: 1.3 mg/dL — ABNORMAL HIGH (ref 0.44–1.00)
GFR calc Af Amer: 41 mL/min — ABNORMAL LOW (ref >60)
GFR calc non Af Amer: 35 mL/min — ABNORMAL LOW (ref >60)
Glucose, Bld: 117 mg/dL — ABNORMAL HIGH (ref 70–99)
Potassium: 4.9 mmol/L (ref 3.5–5.1)
Sodium: 138 mmol/L (ref 135–145)
Total Bilirubin: 0.7 mg/dL (ref 0.3–1.2)
Total Protein: 5.1 g/dL — ABNORMAL LOW (ref 6.5–8.1)

## 2019-02-04 LAB — CBC WITH DIFFERENTIAL/PLATELET
Abs Immature Granulocytes: 0.05 10*3/uL (ref 0.00–0.07)
Basophils Absolute: 0 10*3/uL (ref 0.0–0.1)
Basophils Relative: 0 %
Eosinophils Absolute: 0.1 10*3/uL (ref 0.0–0.5)
Eosinophils Relative: 0 %
HCT: 29.4 % — ABNORMAL LOW (ref 36.0–46.0)
Hemoglobin: 9.4 g/dL — ABNORMAL LOW (ref 12.0–15.0)
Immature Granulocytes: 0 %
Lymphocytes Relative: 75 %
Lymphs Abs: 17.2 10*3/uL — ABNORMAL HIGH (ref 0.7–4.0)
MCH: 32.1 pg (ref 26.0–34.0)
MCHC: 32 g/dL (ref 30.0–36.0)
MCV: 100.3 fL — ABNORMAL HIGH (ref 80.0–100.0)
Monocytes Absolute: 0.7 10*3/uL (ref 0.1–1.0)
Monocytes Relative: 3 %
Neutro Abs: 5.1 10*3/uL (ref 1.7–7.7)
Neutrophils Relative %: 22 %
Platelets: 116 10*3/uL — ABNORMAL LOW (ref 150–400)
RBC: 2.93 MIL/uL — ABNORMAL LOW (ref 3.87–5.11)
RDW: 12.3 % (ref 11.5–15.5)
WBC: 23.2 10*3/uL — ABNORMAL HIGH (ref 4.0–10.5)
nRBC: 0 % (ref 0.0–0.2)

## 2019-02-04 NOTE — Progress Notes (Signed)
Subjective: More awake:  Complains ongoing (but not worsening) abdominal pain. Unclear if she's had bloody stools overnight.  Objective: Vital signs in last 24 hours: Temp:  [98.5 F (36.9 C)-98.7 F (37.1 C)] 98.5 F (36.9 C) (11/06 0619) Pulse Rate:  [68-100] 83 (11/06 0619) Resp:  [16-20] 18 (11/05 2014) BP: (97-126)/(42-84) 115/58 (11/06 0619) SpO2:  [91 %-100 %] 98 % (11/06 0619) Weight:  [48.7 kg] 48.7 kg (11/06 0659) Weight change:     PE: GEN:  Thin, cachectic, frail-appearing SKIN:  Pale-appearing (chronic) ABD:  Soft, mild generalized tenderness, no peritonitis  Lab Results: CBC    Component Value Date/Time   WBC 23.2 (H) 02/04/2019 0201   RBC 2.93 (L) 02/04/2019 0201   HGB 9.4 (L) 02/04/2019 0201   HGB 11.7 04/14/2017 1414   HGB 12.4 10/14/2016 1404   HCT 29.4 (L) 02/04/2019 0201   HCT 37.5 10/14/2016 1404   PLT 116 (L) 02/04/2019 0201   PLT 130 (L) 04/14/2017 1414   PLT 142 (L) 10/14/2016 1404   MCV 100.3 (H) 02/04/2019 0201   MCV 94.6 10/14/2016 1404   MCH 32.1 02/04/2019 0201   MCHC 32.0 02/04/2019 0201   RDW 12.3 02/04/2019 0201   RDW 13.5 10/14/2016 1404   LYMPHSABS 17.2 (H) 02/04/2019 0201   LYMPHSABS 6.8 (H) 10/14/2016 1404   MONOABS 0.7 02/04/2019 0201   MONOABS 0.3 10/14/2016 1404   EOSABS 0.1 02/04/2019 0201   EOSABS 0.1 10/14/2016 1404   BASOSABS 0.0 02/04/2019 0201   BASOSABS 0.0 10/14/2016 1404   CMP     Component Value Date/Time   NA 138 02/04/2019 0201   NA 142 08/11/2017 1028   NA 142 10/14/2016 1404   K 4.9 02/04/2019 0201   K 4.6 10/14/2016 1404   CL 107 02/04/2019 0201   CO2 24 02/04/2019 0201   CO2 28 10/14/2016 1404   GLUCOSE 117 (H) 02/04/2019 0201   GLUCOSE 98 10/14/2016 1404   BUN 35 (H) 02/04/2019 0201   BUN 27 08/11/2017 1028   BUN 21.4 10/14/2016 1404   CREATININE 1.30 (H) 02/04/2019 0201   CREATININE 0.81 04/12/2018 1335   CREATININE 1.0 10/14/2016 1404   CALCIUM 8.1 (L) 02/04/2019 0201   CALCIUM 9.2  10/14/2016 1404   PROT 5.1 (L) 02/04/2019 0201   PROT 6.0 08/11/2017 1028   PROT 6.3 (L) 10/14/2016 1404   ALBUMIN 2.7 (L) 02/04/2019 0201   ALBUMIN 3.8 08/11/2017 1028   ALBUMIN 3.6 10/14/2016 1404   AST 21 02/04/2019 0201   AST 14 (L) 04/12/2018 1335   AST 15 10/14/2016 1404   ALT 12 02/04/2019 0201   ALT 10 04/12/2018 1335   ALT 10 10/14/2016 1404   ALKPHOS 40 02/04/2019 0201   ALKPHOS 61 10/14/2016 1404   BILITOT 0.7 02/04/2019 0201   BILITOT 0.5 04/12/2018 1335   BILITOT 0.46 10/14/2016 1404   GFRNONAA 35 (L) 02/04/2019 0201   GFRNONAA >60 04/12/2018 1335   GFRAA 41 (L) 02/04/2019 0201   GFRAA >60 04/12/2018 1335   Assessment:  1.  Abdominal pain, likely diverticulitis.  Ischemic colitis or segmental colitis associated with diverticulosis (SCAD) are also possible, but seem less likely. 2.  Abnormal CT scan, sigmoid diverticulosis with suspected diverticulitis. 3.  Hematochezia, new onset, seemingly improving at present. 4.  Leukocytosis, improving. 5.  Anemia, suspected acute blood loss with some possible dilutional component. 6.  Chronic large hiatal hernia, maintained on essentially gastroparesis-type diet as outpatient. 7.  Altered mental status:  Could be from analgesics or infection or both.  Significantly improved from yesterday.  Plan:  1.  Continue current care:  IV fluids, antibiotics, clear liquids. 2.  Consider OOBTC or PT eval within the next day or two. 3.  When/if recurrent large hematochezia, would pursue tagged RBC study for further evaluation. 4.  [facility] will follow.   Landry Dyke 02/04/2019, 11:25 AM   Cell [phone] If no answer or after 5 PM call [phone]

## 2019-02-04 NOTE — Progress Notes (Signed)
Pt arrived to the unit with daughter, Vaughan Basta. Alert/orientedx 3-4. No s/s distress noted. Denies pain/nausea at this time. Vital signs checked, telemetry monitor in place. Bath done. Admission documentation completed with daughter's help. Skin check completed with Clarise Cruz, RN. Bed low with wheels locked. Call bell is within reach.

## 2019-02-04 NOTE — Progress Notes (Signed)
PHARMACY - PHYSICIAN COMMUNICATION CRITICAL VALUE ALERT - BLOOD CULTURE IDENTIFICATION (BCID)  Mary Cline is an 85 y.o. female who presented to [facility] on 02/02/2019 with a chief complaint of abdominal pain, nausea  Assessment:  Patient admitted for suspected diverticulitis.  One blood culture bottle positive for gram positive rods - suspect contamination.  Name of physician (or Provider) Contacted: Dr. Antonieta Pert  Current antibiotics: Ceftriaxone + metronidazole  Changes to prescribed antibiotics recommended: None at this time   No results found for this or any previous visit.  [name] 02/04/2019  9:31 AM

## 2019-02-04 NOTE — Progress Notes (Signed)
PROGRESS NOTE    Mary Cline  [id number] DOB: [birth year] DOA: 02/02/2019 PCP: [name], MD   Brief Narrative: 85 year old female with chronic systolic CHF EF 20 to [id number], dementia, hypertension, CKD stage III presented to the ER 1 day history of lower abdominal pain, nausea, bloody diarrhea. In ER afebrile saturating well blood pressure stable lab with creatinine 1.2, WBC 30 K, CT abdomen abdomen and pelvis reveals sigmoid thickening concerning for diverticulitis.  Blood and urine cultures were collected, stool studies ordered,was started on Rocephin and Flagyl and was admitted for further management.  Subjective:  Seen and examined. Just got hearing aid place is alert awake. No bowel movement per nursing staff.  Patient reports she has mild abdominal discomfort in lower abdomen but feels significantly improved. Overnight afebrile and WBC trending down.  Assessment & Plan:  Abdomen pain/rectal bleeding with CT showing sigmoid diverticulitis: Differential includes ischemic colitis or segmental colitis.  Overall improving.  Hemoglobin down trended to 9.4 g.FOBT positive, followed by Dr. Paulita Fujita and appreciate his input.  Continue empiric antibiotics, clear liquid diet supportive care.  If patient has recurrent bleeding can consider tagged RBC study.  Ceftriaxone and Flagyl.  Anemia of chronic disease/enal disease, likely from GI bleed as above. Hb around 10 g at baseline. Some drop but no bowel movement per nursing staff.   Recent Labs  Lab 02/02/19 1753 02/03/19 0429 02/03/19 0754 02/03/19 2103 02/04/19 0201  HGB 11.5* 12.0 10.0* 10.4* 9.4*  HCT 35.9* 37.4 30.9* 32.1* 29.4*    Sepsis with leukocytosis, lactic acidosis 1.  Lactic acidosis resolved, leukocytosis downtrending nicely on iv antibiotics but still high. Recent Labs  Lab 02/02/19 1753 02/03/19 0754 02/04/19 0201  WBC 30.6* 29.0* 23.2*   1 blood culture positive with gram-positive rods suspect  contamination.  Continue current antibiotics appreciate pharmacy input  Chronic systolic heart failure, EF 20 to 25% in echo March 2020, compensated continue to hold diuretics tolerating clear liquid diet will stop IV fluids in few hours today. Discussed with nursing staff.  Patient has mild leg edema.  Cont to hold home Coreg Aldactone.  CKD, stage III a: Baseline creatinine 1- 1.2: Continue liquid diet.  Recent Labs  Lab 02/02/19 1753 02/03/19 0754 02/04/19 0201  BUN 29* 35* 35*  CREATININE 1.24* 1.40* 1.30*   Essential hypertension: Blood pressure controlled hold irbesartan/Coreg.  ?  Episode of apnea in the ER.  She is on supplemental oxygen weaned off slowly.  Patient has celebrating and had similar episodes at home has chronically elevated left hemidiaphragm.  Chest x-ray otherwise no acute finding.  Continue telemetry and prn supplemental o2.  May have underlying sleep apnea.  Body mass index is 23.23 kg/m.   DVT prophylaxis: SCD Code Status: limited- no intubation- DNI okay for CPR.  I had discussed with patient's daughters yesterday Family Communication: plan of care discussed with patient's daughter 11/5 Disposition Plan: Remains inpatient pending clinical improvement sepsis, diverticulitis.  She continues to needs iv antibiotics.  Consultants: GI  Procedures: none  Microbiology: [id number] negative. C Diff  Negative,  Antimicrobials: Anti-infectives (From admission, onward)   Start     Dose/Rate Route Frequency Ordered Stop   02/04/19 0445  cefTRIAXone (ROCEPHIN) 2 g in sodium chloride 0.9 % 100 mL IVPB     2 g 200 mL/hr over 30 Minutes Intravenous Every 24 hours 02/03/19 0629     02/03/19 1400  metroNIDAZOLE (FLAGYL) IVPB 500 mg     500 mg 100 mL/hr over  60 Minutes Intravenous Every 8 hours 02/03/19 0629     02/03/19 0545  metroNIDAZOLE (FLAGYL) IVPB 500 mg     500 mg 100 mL/hr over 60 Minutes Intravenous  Once 02/03/19 0541 02/03/19 0712   02/03/19 0445   cefTRIAXone (ROCEPHIN) 1 g in sodium chloride 0.9 % 100 mL IVPB     1 g 200 mL/hr over 30 Minutes Intravenous  Once 02/03/19 0438 02/03/19 0611       Objective: Vitals:   02/03/19 2045 02/03/19 2135 02/04/19 0619 02/04/19 0659  BP: 126/77  (!) 115/58   Pulse: 99  83   Resp:      Temp: 98.7 F (37.1 C)  98.5 F (36.9 C)   TempSrc: Oral  Oral   SpO2: 100%  98%   Weight:    48.7 kg  Height:  4\' 9"  (1.448 m)      Intake/Output Summary (Last 24 hours) at 02/04/2019 0953 Last data filed at 02/04/2019 0641 Gross per 24 hour  Intake 805.56 ml  Output --  Net 805.56 ml   Filed Weights   02/04/19 0659  Weight: 48.7 kg   Weight change:   Body mass index is 23.23 kg/m.  Intake/Output from previous day: 11/05 0701 - 11/06 0700 In: 805.6 [I.V.:505.6; IV Piggyback:300] Out: -  Intake/Output this shift: No intake/output data recorded.  Examination:  General exam: AAO x2 elderly thin and frail.  But alert awake not in distress. HEENT:Oral mucosa moist, Ear/Nose WNL grossly, dentition normal. Respiratory system: Diminished at the base,no wheezing or crackles,no use of accessory muscle Cardiovascular system: S1 & S2 +, No JVD,. Gastrointestinal system: Abdomen soft, mildly tender on lower abdomen,ND, BS+ Nervous System:Alert, awake, moving extremities and grossly nonfocal Extremities: mild leg edema, distal peripheral pulses palpable.  Skin: No rashes,no icterus. MSK: Normal muscle bulk,tone, power  Medications:  Scheduled Meds:  sodium chloride flush  3 mL Intravenous Once   sodium chloride flush  3 mL Intravenous Q12H   sodium chloride flush  3 mL Intravenous Q12H   Continuous Infusions:  sodium chloride     sodium chloride 30 mL/hr at 02/03/19 1551   cefTRIAXone (ROCEPHIN)  IV 2 g (02/04/19 0436)   metronidazole 500 mg (02/04/19 0606)    Data Reviewed: I have personally reviewed following labs and imaging studies  CBC: Recent Labs  Lab 02/02/19 1753  02/03/19 0429 02/03/19 0754 02/03/19 2103 02/04/19 0201  WBC 30.6*  --  29.0*  --  23.2*  NEUTROABS  --   --   --   --  5.1  HGB 11.5* 12.0 10.0* 10.4* 9.4*  HCT 35.9* 37.4 30.9* 32.1* 29.4*  MCV 99.7  --  99.7  --  100.3*  PLT 146*  --  124*  --  99991111*   Basic Metabolic Panel: Recent Labs  Lab 02/02/19 1753 02/03/19 0754 02/04/19 0201  NA 138 139 138  K 5.2* 4.6 4.9  CL 103 105 107  CO2 26 24 24   GLUCOSE 151* 162* 117*  BUN 29* 35* 35*  CREATININE 1.24* 1.40* 1.30*  CALCIUM 9.3 8.6* 8.1*   GFR: Estimated Creatinine Clearance: 17.8 mL/min (A) (by C-G formula based on SCr of 1.3 mg/dL (H)). Liver Function Tests: Recent Labs  Lab 02/02/19 1753 02/03/19 0754 02/04/19 0201  AST 18 16 21   ALT 12 12 12   ALKPHOS 61 48 40  BILITOT 0.9 0.7 0.7  PROT 6.1* 5.5* 5.1*  ALBUMIN 3.5 3.0* 2.7*   Recent Labs  Lab  02/02/19 1753  LIPASE 34   No results for input(s): AMMONIA in the last 168 hours. Coagulation Profile: Recent Labs  Lab 02/03/19 0429  INR 1.2   Cardiac Enzymes: No results for input(s): CKTOTAL, CKMB, CKMBINDEX, TROPONINI in the last 168 hours. BNP (last 3 results) No results for input(s): PROBNP in the last 8760 hours. HbA1C: No results for input(s): HGBA1C in the last 72 hours. CBG: No results for input(s): GLUCAP in the last 168 hours. Lipid Profile: No results for input(s): CHOL, HDL, LDLCALC, TRIG, CHOLHDL, LDLDIRECT in the last 72 hours. Thyroid Function Tests: No results for input(s): TSH, T4TOTAL, FREET4, T3FREE, THYROIDAB in the last 72 hours. Anemia Panel: No results for input(s): VITAMINB12, FOLATE, FERRITIN, TIBC, IRON, RETICCTPCT in the last 72 hours. Sepsis Labs: Recent Labs  Lab 02/03/19 0426 02/03/19 0628  LATICACIDVEN 2.7* 1.9    Recent Results (from the past 240 hour(s))  Urine culture     Status: Abnormal (Preliminary result)   Collection Time: 02/02/19  9:29 PM   Specimen: Urine, Random  Result Value Ref Range Status    Specimen Description URINE, RANDOM  Final   Special Requests   Final    NONE Performed at [facility], [place] [address] [place], [place] [address]    Culture 60,000 COLONIES/mL STAPHYLOCOCCUS AUREUS (A)  Final   Report Status PENDING  Incomplete  Blood culture (routine x 2)     Status: None (Preliminary result)   Collection Time: 02/03/19  4:25 AM   Specimen: BLOOD LEFT FOREARM  Result Value Ref Range Status   Specimen Description BLOOD LEFT FOREARM  Final   Special Requests   Final    BOTTLES DRAWN AEROBIC AND ANAEROBIC Blood Culture adequate volume   Culture  Setup Time   Final    GRAM POSITIVE RODS AEROBIC BOTTLE ONLY CRITICAL RESULT CALLED TO, READ BACK BY AND VERIFIED WITH: Susy Manor  [id number] [place]  Performed at [facility], [place] [address], [place], [place] [address]    Culture PENDING  Incomplete   Report Status PENDING  Incomplete  SARS CORONAVIRUS 2 (TAT 6-24 HRS) Nasopharyngeal Nasopharyngeal Swab     Status: None   Collection Time: 02/03/19  4:35 AM   Specimen: Nasopharyngeal Swab  Result Value Ref Range Status   SARS Coronavirus 2 NEGATIVE NEGATIVE Final    Comment: (NOTE) SARS-CoV-2 target nucleic acids are NOT DETECTED. The SARS-CoV-2 RNA is generally detectable in upper and lower respiratory specimens during the acute phase of infection. Negative results do not preclude SARS-CoV-2 infection, do not rule out co-infections with other pathogens, and should not be used as the sole basis for treatment or other patient management decisions. Negative results must be combined with clinical observations, patient history, and epidemiological information. The expected result is Negative. Fact Sheet for Patients: SugarRoll.be Fact Sheet for Healthcare Providers: https://www.woods-mathews.com/ This test is not yet approved or cleared by the [place] FDA and  has been authorized for detection and/or  diagnosis of SARS-CoV-2 by FDA under an Emergency Use Authorization (EUA). This EUA will remain  in effect (meaning this test can be used) for the duration of the COVID-19 declaration under Section 56 4(b)(1) of the Act, 21 U.S.C. section 360bbb-3(b)(1), unless the authorization is terminated or revoked sooner. Performed at [facility], [place] [address], [place], [place] [address]   Gastrointestinal Panel by PCR , Stool     Status: None   Collection Time: 02/03/19  5:00 AM   Specimen: Stool  Result Value Ref Range Status   Campylobacter species NOT DETECTED NOT DETECTED Final   Plesimonas shigelloides NOT DETECTED NOT DETECTED Final   Salmonella species NOT DETECTED NOT DETECTED Final   Yersinia enterocolitica NOT DETECTED NOT DETECTED Final   Vibrio species NOT DETECTED NOT DETECTED Final   Vibrio cholerae NOT DETECTED NOT DETECTED Final   Enteroaggregative E coli (EAEC) NOT DETECTED NOT DETECTED Final   Enteropathogenic E coli (EPEC) NOT DETECTED NOT DETECTED Final   Enterotoxigenic E coli (ETEC) NOT DETECTED NOT DETECTED Final   Shiga like toxin producing E coli (STEC) NOT DETECTED NOT DETECTED Final   Shigella/Enteroinvasive E coli (EIEC) NOT DETECTED NOT DETECTED Final   Cryptosporidium NOT DETECTED NOT DETECTED Final   Cyclospora cayetanensis NOT DETECTED NOT DETECTED Final   Entamoeba histolytica NOT DETECTED NOT DETECTED Final   Giardia lamblia NOT DETECTED NOT DETECTED Final   Adenovirus F40/41 NOT DETECTED NOT DETECTED Final   Astrovirus NOT DETECTED NOT DETECTED Final   Norovirus GI/GII NOT DETECTED NOT DETECTED Final   Rotavirus A NOT DETECTED NOT DETECTED Final   Sapovirus (I, II, IV, and V) NOT DETECTED NOT DETECTED Final    Comment: Performed at [facility], [place] [place], [place] [address]  C Difficile Quick Screen w PCR reflex     Status: None   Collection Time: 02/03/19  5:00 AM  Result Value Ref Range Status   C Diff antigen NEGATIVE  NEGATIVE Final   C Diff toxin NEGATIVE NEGATIVE Final   C Diff interpretation No C. difficile detected.  Final    Comment: Performed at [facility], [place] [address], [place], [place] [address]      Radiology Studies: Ct Abdomen Pelvis Wo Contrast  Result Date: 02/03/2019 CLINICAL DATA:  Diffuse abdominal pain. Vomiting diarrhea with hematochezia. EXAM: CT ABDOMEN AND PELVIS WITHOUT CONTRAST TECHNIQUE: Multidetector CT imaging of the abdomen and pelvis was performed following the standard protocol without IV contrast. COMPARISON:  June 10, 2018 FINDINGS: Lower chest: The lung bases are clear. The heart is enlarged. Hepatobiliary: The liver is normal. Status post cholecystectomy.There is no biliary ductal dilation. Pancreas: Normal contours without ductal dilatation. No peripancreatic fluid collection. Spleen: No splenic laceration or hematoma. Adrenals/Urinary Tract: --Adrenal glands: No adrenal hemorrhage. --Right kidney/ureter: No hydronephrosis or perinephric hematoma. --Left kidney/ureter: No hydronephrosis or perinephric hematoma. --Urinary bladder: Unremarkable. Stomach/Bowel: --Stomach/Duodenum: No hiatal hernia or other gastric abnormality. Normal duodenal course and caliber. --Small bowel: No dilatation or inflammation. --Colon: There is some wall thickening of the sigmoid colon. --Appendix: Normal. Vascular/Lymphatic: Atherosclerotic calcification is present within the non-aneurysmal abdominal aorta, without hemodynamically significant stenosis. --No retroperitoneal lymphadenopathy. --No mesenteric lymphadenopathy. --No pelvic or inguinal lymphadenopathy. Reproductive: Status post hysterectomy. No adnexal mass. Other: No ascites or free air. The abdominal wall is normal. Musculoskeletal. No acute displaced fractures. IMPRESSION: There is some wall thickening of the sigmoid colon, which could indicate diverticulitis or infectious/inflammatory colitis. Aortic Atherosclerosis (ICD10-I70.0).  Electronically Signed   By: Constance Holster M.D.   On: 02/03/2019 05:16   Dg Chest Port 1 View  Result Date: 02/03/2019 CLINICAL DATA:  Mechanical fall EXAM: PORTABLE CHEST 1 VIEW COMPARISON:  06/10/2018 FINDINGS: Chronic elevation of the left diaphragm with air distended bowel. Atelectasis at the left base. Right lung is clear. Enlarged cardiomediastinal silhouette with aortic atherosclerosis. No pneumothorax. IMPRESSION: Chronic elevation left diaphragm with basilar atelectasis. Cardiomegaly. Electronically Signed   By: Maudie Mercury  Francoise Ceo M.D.   On: 02/03/2019 16:36      LOS: 1 day   Time spent: More than 50% of that time was spent in counseling and/or coordination of care.  Antonieta Pert, MD [facility]  02/04/2019, 9:53 AM

## 2019-02-05 LAB — CBC
HCT: 27.9 % — ABNORMAL LOW (ref 36.0–46.0)
Hemoglobin: 9.1 g/dL — ABNORMAL LOW (ref 12.0–15.0)
MCH: 32.3 pg (ref 26.0–34.0)
MCHC: 32.6 g/dL (ref 30.0–36.0)
MCV: 98.9 fL (ref 80.0–100.0)
Platelets: 121 10*3/uL — ABNORMAL LOW (ref 150–400)
RBC: 2.82 MIL/uL — ABNORMAL LOW (ref 3.87–5.11)
RDW: 12 % (ref 11.5–15.5)
WBC: 21.7 10*3/uL — ABNORMAL HIGH (ref 4.0–10.5)
nRBC: 0 % (ref 0.0–0.2)

## 2019-02-05 LAB — BASIC METABOLIC PANEL
Anion gap: 10 (ref 5–15)
BUN: 28 mg/dL — ABNORMAL HIGH (ref 8–23)
CO2: 20 mmol/L — ABNORMAL LOW (ref 22–32)
Calcium: 8.1 mg/dL — ABNORMAL LOW (ref 8.9–10.3)
Chloride: 112 mmol/L — ABNORMAL HIGH (ref 98–111)
Creatinine, Ser: 1.17 mg/dL — ABNORMAL HIGH (ref 0.44–1.00)
GFR calc Af Amer: 46 mL/min — ABNORMAL LOW (ref >60)
GFR calc non Af Amer: 40 mL/min — ABNORMAL LOW (ref >60)
Glucose, Bld: 110 mg/dL — ABNORMAL HIGH (ref 70–99)
Potassium: 4.2 mmol/L (ref 3.5–5.1)
Sodium: 142 mmol/L (ref 135–145)

## 2019-02-05 LAB — URINE CULTURE: Culture: 60000 — AB

## 2019-02-05 LAB — PATHOLOGIST SMEAR REVIEW

## 2019-02-05 MED ORDER — CARVEDILOL 6.25 MG PO TABS
6.2500 mg | ORAL_TABLET | Freq: Two times a day (BID) | ORAL | Status: DC
  Administered 2019-02-05 – 2019-02-09 (×10): 6.25 mg via ORAL
  Filled 2019-02-05 (×10): qty 1

## 2019-02-05 NOTE — Progress Notes (Signed)
Subjective: Patient reports that her abdominal pain has improved. She denies hematochezia.  Objective: Vital signs in last 24 hours: Temp:  [98.4 F (36.9 C)-98.9 F (37.2 C)] 98.4 F (36.9 C) (11/07 0536) Pulse Rate:  [81-87] 87 (11/07 1116) Resp:  [14-19] 14 (11/07 0536) BP: (109-123)/(61-73) 118/66 (11/07 1116) SpO2:  [96 %-100 %] 96 % (11/07 0536) Weight:  [51.1 kg] 51.1 kg (11/07 0405) Weight change: 2.4 kg    PE: GEN:   Thin, cachectic, pale GEN:  Soft, mild generalized tenderness (improved), no peritonitis  Lab Results: CBC    Component Value Date/Time   WBC 21.7 (H) 02/05/2019 0206   RBC 2.82 (L) 02/05/2019 0206   HGB 9.1 (L) 02/05/2019 0206   HGB 11.7 04/14/2017 1414   HGB 12.4 10/14/2016 1404   HCT 27.9 (L) 02/05/2019 0206   HCT 37.5 10/14/2016 1404   PLT 121 (L) 02/05/2019 0206   PLT 130 (L) 04/14/2017 1414   PLT 142 (L) 10/14/2016 1404   MCV 98.9 02/05/2019 0206   MCV 94.6 10/14/2016 1404   MCH 32.3 02/05/2019 0206   MCHC 32.6 02/05/2019 0206   RDW 12.0 02/05/2019 0206   RDW 13.5 10/14/2016 1404   LYMPHSABS 17.2 (H) 02/04/2019 0201   LYMPHSABS 6.8 (H) 10/14/2016 1404   MONOABS 0.7 02/04/2019 0201   MONOABS 0.3 10/14/2016 1404   EOSABS 0.1 02/04/2019 0201   EOSABS 0.1 10/14/2016 1404   BASOSABS 0.0 02/04/2019 0201   BASOSABS 0.0 10/14/2016 1404   CMP     Component Value Date/Time   NA 142 02/05/2019 0206   NA 142 08/11/2017 1028   NA 142 10/14/2016 1404   K 4.2 02/05/2019 0206   K 4.6 10/14/2016 1404   CL 112 (H) 02/05/2019 0206   CO2 20 (L) 02/05/2019 0206   CO2 28 10/14/2016 1404   GLUCOSE 110 (H) 02/05/2019 0206   GLUCOSE 98 10/14/2016 1404   BUN 28 (H) 02/05/2019 0206   BUN 27 08/11/2017 1028   BUN 21.4 10/14/2016 1404   CREATININE 1.17 (H) 02/05/2019 0206   CREATININE 0.81 04/12/2018 1335   CREATININE 1.0 10/14/2016 1404   CALCIUM 8.1 (L) 02/05/2019 0206   CALCIUM 9.2 10/14/2016 1404   PROT 5.1 (L) 02/04/2019 0201   PROT 6.0  08/11/2017 1028   PROT 6.3 (L) 10/14/2016 1404   ALBUMIN 2.7 (L) 02/04/2019 0201   ALBUMIN 3.8 08/11/2017 1028   ALBUMIN 3.6 10/14/2016 1404   AST 21 02/04/2019 0201   AST 14 (L) 04/12/2018 1335   AST 15 10/14/2016 1404   ALT 12 02/04/2019 0201   ALT 10 04/12/2018 1335   ALT 10 10/14/2016 1404   ALKPHOS 40 02/04/2019 0201   ALKPHOS 61 10/14/2016 1404   BILITOT 0.7 02/04/2019 0201   BILITOT 0.5 04/12/2018 1335   BILITOT 0.46 10/14/2016 1404   GFRNONAA 40 (L) 02/05/2019 0206   GFRNONAA >60 04/12/2018 1335   GFRAA 46 (L) 02/05/2019 0206   GFRAA >60 04/12/2018 1335   Assessment:  1. Abdominal pain, likely diverticulitis. Ischemic colitis or segmental colitis associated with diverticulosis (SCAD) are also possible, but seem less likely. 2. Abnormal CT scan, sigmoid diverticulosis with suspected diverticulitis. 3. Hematochezia, new onset, seemingly resolved. 4. Leukocytosis, improving. 5. Anemia, suspected acute blood loss with some possible dilutional component. 6. Chronic large hiatal hernia, maintained on essentially gastroparesis-type diet as outpatient. 7. Altered mental status, resolved.  Plan:  1.  Advance diet to full liquids. 2.  Continue IV antibiotics for  the time-being. 3.  Follow CBC and clinical status. 4.  If continues to improve, might consider further advancing of diet tomorrow and possible transition to oral antibiotics. 5.  Consider OOBTC and PT eval. 6.  [facility] will follow.   Landry Dyke 02/05/2019, 11:34 AM   Cell [phone] If no answer or after 5 PM call [phone]

## 2019-02-05 NOTE — Progress Notes (Signed)
Patient ID: AISSATA DELICH, female   DOB: 1922/08/08, 85 y.o.   MRN: [id number]  PROGRESS NOTE    MAKIYLA MARCELLA  [id number] DOB: December 07, 1922 DOA: 02/02/2019 PCP: [name], MD   Brief Narrative:  85 year old female with history of chronic systolic CHF 20 to [id number], dementia, hypertension, chronic kidney disease stage III presented with abdominal pain, nausea and bloody diarrhea.  She was found to have leukocytosis and CT abdomen and pelvis showed sigmoid thickening concerning for diverticulitis.  She was started on Rocephin and Flagyl.  GI was consulted.  Assessment & Plan:   Probable acute sigmoid diverticulitis presenting with hematochezia and abdominal pain -GI following.  Currently on Rocephin and Flagyl. -Patient still does not feel well and complains of abdominal pain with diarrhea. -Monitor hemoglobin.  Hemoglobin 9.1 today. -Diet advancement as per GI recommendations.  Currently on clear liquid diet.  Sepsis: Present on admission Leukocytosis -Currently hemodynamically stable.  Cultures negative so far except for one blood culture positive for gram-positive rods, possibly contaminant -WBCs improving.  Anemia of chronic disease -Probably from chronic kidney disease.  Hemoglobin around 10 at baseline.  Monitor.  Chronic systolic heart failure  - EF of 20 to 25% in echo of March 2020. -Currently compensated.  Diuretics on hold for now.  Probably restart diuretics tomorrow if needed.  Restart Coreg.  Thrombocytopenia  -Questionable cause.  Monitor.  Chronic kidney disease stage IIIa -Baseline creatinine 1-1.2.  Stable.  Monitor  Essential hypertension -Antihypertensives on hold.  Will restart Coreg.   ?  Episode of apnea in the ER -Required supplemental oxygen initially but currently on room air.  Patient has had similar episodes at home apparently and has chronically elevated left diaphragm.  Chest x-ray otherwise no acute finding.  May have underlying  sleep apnea   DVT prophylaxis: SCDs Code Status: Full Family Communication: Spoke to [name]/daughter on phone Disposition Plan: Home in 1 to 3 days once clinically improved and cleared by GI.  Consultants: GI  Procedures: None  Antimicrobials:  Anti-infectives (From admission, onward)   Start     Dose/Rate Route Frequency Ordered Stop   02/04/19 0445  cefTRIAXone (ROCEPHIN) 2 g in sodium chloride 0.9 % 100 mL IVPB     2 g 200 mL/hr over 30 Minutes Intravenous Every 24 hours 02/03/19 0629     02/03/19 1400  metroNIDAZOLE (FLAGYL) IVPB 500 mg     500 mg 100 mL/hr over 60 Minutes Intravenous Every 8 hours 02/03/19 0629     02/03/19 0545  metroNIDAZOLE (FLAGYL) IVPB 500 mg     500 mg 100 mL/hr over 60 Minutes Intravenous  Once 02/03/19 0541 02/03/19 0712   02/03/19 0445  cefTRIAXone (ROCEPHIN) 1 g in sodium chloride 0.9 % 100 mL IVPB     1 g 200 mL/hr over 30 Minutes Intravenous  Once 02/03/19 0438 02/03/19 [id number]        Subjective: Patient seen and examined at bedside.  She does not feel well and complains of abdominal pain.  No overnight fever, nausea or vomiting.  Still having diarrhea.  Objective: Vitals:   02/04/19 1654 02/04/19 2130 02/05/19 0405 02/05/19 0536  BP: 123/61 120/73  109/63  Pulse: 81 85  86  Resp: 19 16  14   Temp: 98.4 F (36.9 C) 98.9 F (37.2 C)  98.4 F (36.9 C)  TempSrc: Oral Oral  Oral  SpO2: 100% 100%  96%  Weight:   51.1 kg   Height:  Intake/Output Summary (Last 24 hours) at 02/05/2019 1041 Last data filed at 02/05/2019 0553 Gross per 24 hour  Intake 1019.73 ml  Output 948 ml  Net 71.73 ml   Filed Weights   02/04/19 0659 02/05/19 0405  Weight: 48.7 kg 51.1 kg    Examination:  General exam: Appears calm and comfortable.  Looks chronically ill and very thinly built. Respiratory system: Bilateral decreased breath sounds at bases Cardiovascular system: S1 & S2 heard, Rate controlled Gastrointestinal system: Abdomen is  nondistended, soft and mildly tender in the lower abdomen. Normal bowel sounds heard. Extremities: No cyanosis, clubbing, edema    Data Reviewed: I have personally reviewed following labs and imaging studies  CBC: Recent Labs  Lab 02/02/19 1753 02/03/19 0429 02/03/19 0754 02/03/19 2103 02/04/19 0201 02/05/19 0206  WBC 30.6*  --  29.0*  --  23.2* 21.7*  NEUTROABS  --   --   --   --  5.1  --   HGB 11.5* 12.0 10.0* 10.4* 9.4* 9.1*  HCT 35.9* 37.4 30.9* 32.1* 29.4* 27.9*  MCV 99.7  --  99.7  --  100.3* 98.9  PLT 146*  --  124*  --  116* 123XX123*   Basic Metabolic Panel: Recent Labs  Lab 02/02/19 1753 02/03/19 0754 02/04/19 0201 02/05/19 0206  NA 138 139 138 142  K 5.2* 4.6 4.9 4.2  CL 103 105 107 112*  CO2 26 24 24  20*  GLUCOSE 151* 162* 117* 110*  BUN 29* 35* 35* 28*  CREATININE 1.24* 1.40* 1.30* 1.17*  CALCIUM 9.3 8.6* 8.1* 8.1*   GFR: Estimated Creatinine Clearance: 20.2 mL/min (A) (by C-G formula based on SCr of 1.17 mg/dL (H)). Liver Function Tests: Recent Labs  Lab 02/02/19 1753 02/03/19 0754 02/04/19 0201  AST 18 16 21   ALT 12 12 12   ALKPHOS 61 48 40  BILITOT 0.9 0.7 0.7  PROT 6.1* 5.5* 5.1*  ALBUMIN 3.5 3.0* 2.7*   Recent Labs  Lab 02/02/19 1753  LIPASE 34   No results for input(s): AMMONIA in the last 168 hours. Coagulation Profile: Recent Labs  Lab 02/03/19 0429  INR 1.2   Cardiac Enzymes: No results for input(s): CKTOTAL, CKMB, CKMBINDEX, TROPONINI in the last 168 hours. BNP (last 3 results) No results for input(s): PROBNP in the last 8760 hours. HbA1C: No results for input(s): HGBA1C in the last 72 hours. CBG: No results for input(s): GLUCAP in the last 168 hours. Lipid Profile: No results for input(s): CHOL, HDL, LDLCALC, TRIG, CHOLHDL, LDLDIRECT in the last 72 hours. Thyroid Function Tests: No results for input(s): TSH, T4TOTAL, FREET4, T3FREE, THYROIDAB in the last 72 hours. Anemia Panel: No results for input(s): VITAMINB12, FOLATE,  FERRITIN, TIBC, IRON, RETICCTPCT in the last 72 hours. Sepsis Labs: Recent Labs  Lab 02/03/19 0426 02/03/19 0628  LATICACIDVEN 2.7* 1.9    Recent Results (from the past 240 hour(s))  Urine culture     Status: Abnormal   Collection Time: 02/02/19  9:29 PM   Specimen: Urine, Random  Result Value Ref Range Status   Specimen Description URINE, RANDOM  Final   Special Requests   Final    NONE Performed at [facility], [address], [place], [place] [address]    Culture (A)  Final    60,000 COLONIES/mL METHICILLIN RESISTANT STAPHYLOCOCCUS AUREUS   Report Status 02/05/2019 FINAL  Final   Organism ID, Bacteria METHICILLIN RESISTANT STAPHYLOCOCCUS AUREUS (A)  Final      Susceptibility  Methicillin resistant staphylococcus aureus - MIC*    CIPROFLOXACIN >=8 RESISTANT Resistant     GENTAMICIN <=0.5 SENSITIVE Sensitive     NITROFURANTOIN 32 SENSITIVE Sensitive     OXACILLIN >=4 RESISTANT Resistant     TETRACYCLINE <=1 SENSITIVE Sensitive     VANCOMYCIN 1 SENSITIVE Sensitive     TRIMETH/SULFA <=10 SENSITIVE Sensitive     CLINDAMYCIN <=0.25 SENSITIVE Sensitive     RIFAMPIN <=0.5 SENSITIVE Sensitive     Inducible Clindamycin NEGATIVE Sensitive     * 60,000 COLONIES/mL METHICILLIN RESISTANT STAPHYLOCOCCUS AUREUS  Blood culture (routine x 2)     Status: None (Preliminary result)   Collection Time: 02/03/19  4:25 AM   Specimen: BLOOD LEFT FOREARM  Result Value Ref Range Status   Specimen Description BLOOD LEFT FOREARM  Final   Special Requests   Final    BOTTLES DRAWN AEROBIC AND ANAEROBIC Blood Culture adequate volume   Culture  Setup Time   Final    GRAM POSITIVE RODS AEROBIC BOTTLE ONLY CRITICAL RESULT CALLED TO, READ BACK BY AND VERIFIED WITH: PHARMD JEREMY FRENS  [id number] FCP     Culture   Final    GRAM POSITIVE RODS IDENTIFICATION TO FOLLOW Performed at [facility], [place] [address] [place], [place] [address]    Report Status PENDING  Incomplete  SARS  CORONAVIRUS 2 (TAT 6-24 HRS) Nasopharyngeal Nasopharyngeal Swab     Status: None   Collection Time: 02/03/19  4:35 AM   Specimen: Nasopharyngeal Swab  Result Value Ref Range Status   SARS Coronavirus 2 NEGATIVE NEGATIVE Final    Comment: (NOTE) SARS-CoV-2 target nucleic acids are NOT DETECTED. The SARS-CoV-2 RNA is generally detectable in upper and lower respiratory specimens during the acute phase of infection. Negative results do not preclude SARS-CoV-2 infection, do not rule out co-infections with other pathogens, and should not be used as the sole basis for treatment or other patient management decisions. Negative results must be combined with clinical observations, patient history, and epidemiological information. The expected result is Negative. Fact Sheet for Patients: SugarRoll.be Fact Sheet for Healthcare Providers: https://www.woods-mathews.com/ This test is not yet approved or cleared by the [place] FDA and  has been authorized for detection and/or diagnosis of SARS-CoV-2 by FDA under an Emergency Use Authorization (EUA). This EUA will remain  in effect (meaning this test can be used) for the duration of the COVID-19 declaration under Section 56 4(b)(1) of the Act, 21 U.S.C. section 360bbb-3(b)(1), unless the authorization is terminated or revoked sooner. Performed at [facility], [place] [address], [place], [place] [address]   Gastrointestinal Panel by PCR , Stool     Status: None   Collection Time: 02/03/19  5:00 AM   Specimen: Stool  Result Value Ref Range Status   Campylobacter species NOT DETECTED NOT DETECTED Final   Plesimonas shigelloides NOT DETECTED NOT DETECTED Final   Salmonella species NOT DETECTED NOT DETECTED Final   Yersinia enterocolitica NOT DETECTED NOT DETECTED Final   Vibrio species NOT DETECTED NOT DETECTED Final   Vibrio cholerae NOT DETECTED NOT DETECTED Final   Enteroaggregative E coli (EAEC)  NOT DETECTED NOT DETECTED Final   Enteropathogenic E coli (EPEC) NOT DETECTED NOT DETECTED Final   Enterotoxigenic E coli (ETEC) NOT DETECTED NOT DETECTED Final   Shiga like toxin producing E coli (STEC) NOT DETECTED NOT DETECTED Final   Shigella/Enteroinvasive E coli (EIEC) NOT DETECTED NOT DETECTED Final   Cryptosporidium NOT DETECTED NOT  DETECTED Final   Cyclospora cayetanensis NOT DETECTED NOT DETECTED Final   Entamoeba histolytica NOT DETECTED NOT DETECTED Final   Giardia lamblia NOT DETECTED NOT DETECTED Final   Adenovirus F40/41 NOT DETECTED NOT DETECTED Final   Astrovirus NOT DETECTED NOT DETECTED Final   Norovirus GI/GII NOT DETECTED NOT DETECTED Final   Rotavirus A NOT DETECTED NOT DETECTED Final   Sapovirus (I, II, IV, and V) NOT DETECTED NOT DETECTED Final    Comment: Performed at [facility], [place], [place] [address]  C Difficile Quick Screen w PCR reflex     Status: None   Collection Time: 02/03/19  5:00 AM  Result Value Ref Range Status   C Diff antigen NEGATIVE NEGATIVE Final   C Diff toxin NEGATIVE NEGATIVE Final   C Diff interpretation No C. difficile detected.  Final    Comment: Performed at [facility], [place] [address], [place], [place] [address]  Blood culture (routine x 2)     Status: None (Preliminary result)   Collection Time: 02/03/19  5:15 AM   Specimen: BLOOD RIGHT FOREARM  Result Value Ref Range Status   Specimen Description BLOOD RIGHT FOREARM  Final   Special Requests   Final    BOTTLES DRAWN AEROBIC AND ANAEROBIC Blood Culture adequate volume   Culture   Final    NO GROWTH 2 DAYS Performed at [facility], [place] [address], [place],  [address]    Report Status PENDING  Incomplete         Radiology Studies: Dg Chest Port 1 View  Result Date: 02/03/2019 CLINICAL DATA:  Mechanical fall EXAM: PORTABLE CHEST 1 VIEW COMPARISON:  06/10/2018 FINDINGS: Chronic elevation of the left diaphragm with air  distended bowel. Atelectasis at the left base. Right lung is clear. Enlarged cardiomediastinal silhouette with aortic atherosclerosis. No pneumothorax. IMPRESSION: Chronic elevation left diaphragm with basilar atelectasis. Cardiomegaly. Electronically Signed   By: Donavan Foil M.D.   On: 02/03/2019 16:36        Scheduled Meds: . sodium chloride flush  3 mL Intravenous Once  . sodium chloride flush  3 mL Intravenous Q12H  . sodium chloride flush  3 mL Intravenous Q12H   Continuous Infusions: . sodium chloride    . cefTRIAXone (ROCEPHIN)  IV 2 g (02/05/19 0433)  . metronidazole 500 mg (02/05/19 0551)          Aline August, MD [facility] 02/05/2019, 10:41 AM

## 2019-02-05 NOTE — Evaluation (Signed)
Physical Therapy Evaluation Patient Details Name: Mary Cline MRN: [id number] DOB: [birth year] Today's Date: 02/05/2019   History of Present Illness  85 y.o. female with medical history significant for chronic systolic CHF with EF 20 to 25%, dementia, hypertension, and chronic kidney disease stage III. She presented to the emergency department with 1 day of lower abdominal pain, nausea, and bloody diarrhea. Pt admitted for diverticulitis.    Clinical Impression  Pt admitted with above diagnosis. On eval, pt required min assist bed mobility, min assist transfers and min guard assist ambulation 30' with RW. Daughter present during session and reports pt appears to be close to baseline. She requires 24-hour assist at home. PT to follow acutely to maximize mobility for safe discharge home. No follow up services indicated.    Follow Up Recommendations No PT follow up;Supervision/Assistance - 24 hour    Equipment Recommendations  None recommended by PT    Recommendations for Other Services       Precautions / Restrictions Precautions Precautions: Fall Precaution Comments: Pt fell in May 2020 sustaining L wrist fx.      Mobility  Bed Mobility Overal bed mobility: Needs Assistance Bed Mobility: Supine to Sit;Sit to Supine     Supine to sit: Min assist;HOB elevated Sit to supine: Min guard;HOB elevated   General bed mobility comments: +rail  Transfers Overall transfer level: Needs assistance Equipment used: Rolling walker (2 wheeled) Transfers: Sit to/from Stand Sit to Stand: Min assist         General transfer comment: increased time, min assist to power up  Ambulation/Gait Ambulation/Gait assistance: Min guard Gait Distance (Feet): 30 Feet Assistive device: Rolling walker (2 wheeled) Gait Pattern/deviations: Step-through pattern;Decreased stride length Gait velocity: decreased Gait velocity interpretation: <1.31 ft/sec, indicative of household ambulator General  Gait Details: distance limited due to pt not wanting to ambulate in hallway  Stairs            Wheelchair Mobility    Modified Rankin (Stroke Patients Only)       Balance Overall balance assessment: Needs assistance Sitting-balance support: No upper extremity supported;Feet supported Sitting balance-Leahy Scale: Fair     Standing balance support: Bilateral upper extremity supported;During functional activity Standing balance-Leahy Scale: Poor Standing balance comment: reliant on RW                             Pertinent Vitals/Pain Pain Assessment: No/denies pain    Home Living Family/patient expects to be discharged to:: Private residence Living Arrangements: Children Available Help at Discharge: Family;Available 24 hours/day Type of Home: House Home Access: Ramped entrance     Home Layout: Able to live on main level with bedroom/bathroom Home Equipment: Walker - 2 wheels;Wheelchair - Brewing technologist;Toilet riser;Walker - 4 wheels      Prior Function Level of Independence: Needs assistance   Gait / Transfers Assistance Needed: ambulates supervision/min guard with rollator  ADL's / Homemaking Assistance Needed: assist for all ADL's/IADL's   Comments: retired Glass blower/designer Dominance   Dominant Hand: Right    Extremity/Trunk Assessment   Upper Extremity Assessment Upper Extremity Assessment: Defer to OT evaluation    Lower Extremity Assessment Lower Extremity Assessment: Generalized weakness    Cervical / Trunk Assessment Cervical / Trunk Assessment: Kyphotic  Communication   Communication: HOH  Cognition Arousal/Alertness: Awake/alert Behavior During Therapy: WFL for tasks assessed/performed Overall Cognitive Status: History of cognitive impairments - at baseline  General Comments: h/o dementia      General Comments General comments (skin integrity, edema, etc.): Pt on 1L O2 at rest  in bed. Ambulated on RA with SpO2 94%.    Exercises     Assessment/Plan    PT Assessment Patient needs continued PT services  PT Problem List Decreased strength;Decreased mobility;Decreased knowledge of precautions;Decreased activity tolerance;Decreased balance;Decreased safety awareness       PT Treatment Interventions Therapeutic activities;Gait training;Therapeutic exercise;Patient/family education;Balance training;Functional mobility training    PT Goals (Current goals can be found in the Care Plan section)  Acute Rehab PT Goals Patient Stated Goal: home PT Goal Formulation: With patient/family Time For Goal Achievement: 02/19/19 Potential to Achieve Goals: Fair    Frequency Min 3X/week   Barriers to discharge        Co-evaluation               AM-PAC PT "6 Clicks" Mobility  Outcome Measure Help needed turning from your back to your side while in a flat bed without using bedrails?: A Little Help needed moving from lying on your back to sitting on the side of a flat bed without using bedrails?: A Little Help needed moving to and from a bed to a chair (including a wheelchair)?: A Little Help needed standing up from a chair using your arms (e.g., wheelchair or bedside chair)?: A Little Help needed to walk in hospital room?: A Little Help needed climbing 3-5 steps with a railing? : A Lot 6 Click Score: 17    End of Session Equipment Utilized During Treatment: Gait belt;Oxygen Activity Tolerance: Patient tolerated treatment well Patient left: in bed;with family/visitor present;with call bell/phone within reach Nurse Communication: Mobility status PT Visit Diagnosis: Muscle weakness (generalized) (M62.81)    Time: [id number] PT Time Calculation (min) (ACUTE ONLY): 19 min   Charges:   PT Evaluation $PT Eval Low Complexity: 1 Low          Mary Cline, PT  Office # [phone] Pager [phone]   Mary Cline 02/05/2019, 3:20 PM

## 2019-02-06 ENCOUNTER — Inpatient Hospital Stay (HOSPITAL_COMMUNITY): Payer: Medicare PPO

## 2019-02-06 LAB — CULTURE, BLOOD (ROUTINE X 2): Special Requests: ADEQUATE

## 2019-02-06 LAB — BASIC METABOLIC PANEL
Anion gap: 11 (ref 5–15)
BUN: 25 mg/dL — ABNORMAL HIGH (ref 8–23)
CO2: 20 mmol/L — ABNORMAL LOW (ref 22–32)
Calcium: 8.3 mg/dL — ABNORMAL LOW (ref 8.9–10.3)
Chloride: 108 mmol/L (ref 98–111)
Creatinine, Ser: 1.25 mg/dL — ABNORMAL HIGH (ref 0.44–1.00)
GFR calc Af Amer: 43 mL/min — ABNORMAL LOW (ref >60)
GFR calc non Af Amer: 37 mL/min — ABNORMAL LOW (ref >60)
Glucose, Bld: 136 mg/dL — ABNORMAL HIGH (ref 70–99)
Potassium: 3.9 mmol/L (ref 3.5–5.1)
Sodium: 139 mmol/L (ref 135–145)

## 2019-02-06 LAB — CBC
HCT: 30.2 % — ABNORMAL LOW (ref 36.0–46.0)
Hemoglobin: 9.7 g/dL — ABNORMAL LOW (ref 12.0–15.0)
MCH: 32.3 pg (ref 26.0–34.0)
MCHC: 32.1 g/dL (ref 30.0–36.0)
MCV: 100.7 fL — ABNORMAL HIGH (ref 80.0–100.0)
Platelets: 139 10*3/uL — ABNORMAL LOW (ref 150–400)
RBC: 3 MIL/uL — ABNORMAL LOW (ref 3.87–5.11)
RDW: 12.1 % (ref 11.5–15.5)
WBC: 21.6 10*3/uL — ABNORMAL HIGH (ref 4.0–10.5)
nRBC: 0 % (ref 0.0–0.2)

## 2019-02-06 LAB — MAGNESIUM: Magnesium: 1.9 mg/dL (ref 1.7–2.4)

## 2019-02-06 MED ORDER — FUROSEMIDE 10 MG/ML IJ SOLN
20.0000 mg | Freq: Once | INTRAMUSCULAR | Status: AC
  Administered 2019-02-06: 20 mg via INTRAVENOUS
  Filled 2019-02-06: qty 2

## 2019-02-06 MED ORDER — CEFDINIR 250 MG/5ML PO SUSR
300.0000 mg | Freq: Every day | ORAL | Status: DC
  Administered 2019-02-06 – 2019-02-09 (×4): 300 mg via ORAL
  Filled 2019-02-06 (×4): qty 6

## 2019-02-06 MED ORDER — DIPHENHYDRAMINE HCL 25 MG PO CAPS
25.0000 mg | ORAL_CAPSULE | Freq: Every evening | ORAL | Status: DC | PRN
  Administered 2019-02-06 – 2019-02-08 (×3): 25 mg via ORAL
  Filled 2019-02-06 (×3): qty 1

## 2019-02-06 NOTE — Progress Notes (Signed)
Patient ID: Mary Cline, female   DOB: Jul 17, 1922, 85 y.o.   MRN: [id number]  PROGRESS NOTE    Mary Cline  [id number] DOB: October 10, 1922 DOA: 02/02/2019 PCP: [name], MD   Brief Narrative:  85 year old female with history of chronic systolic CHF 20 to [id number], dementia, hypertension, chronic kidney disease stage III presented with abdominal pain, nausea and bloody diarrhea.  She was found to have leukocytosis and CT abdomen and pelvis showed sigmoid thickening concerning for diverticulitis.  She was started on Rocephin and Flagyl.  GI was consulted.  Assessment & Plan:   Probable acute sigmoid diverticulitis presenting with hematochezia and abdominal pain -GI following.  Currently on Rocephin and Flagyl. -Monitor hemoglobin.  Hemoglobin 9.7 today. -We will advance diet to soft diet today.  Patient still complains of intermittent abdominal pain although her diarrhea is improving.  Sepsis: Present on admission Leukocytosis -Currently hemodynamically stable.  Cultures negative so far except for one blood culture positive for gram-positive rods, possibly contaminant -WBCs still elevated but slightly improving.  Anemia of chronic disease -Probably from chronic kidney disease.  Hemoglobin around 10 at baseline.  Monitor.  Chronic systolic heart failure  - EF of 20 to 25% in echo of March 2020. -Currently compensated.  Continue Coreg. -Patient complains of more shortness of breath.  Will get a chest x-ray portable.  Will give 1 dose of intravenous Lasix 20 mg now.  Thrombocytopenia  -Questionable cause.  Monitor.  Chronic kidney disease stage IIIa -Baseline creatinine 1-1.2.  Stable.  Monitor  Essential hypertension -Continue Coreg.  Blood pressure stable.  ?  Episode of apnea in the ER -Required supplemental oxygen initially but currently on room air.  Patient has had similar episodes at home apparently and has chronically elevated left diaphragm.  Chest x-ray  otherwise no acute finding.  May have underlying sleep apnea   DVT prophylaxis: SCDs Code Status: Full Family Communication: Spoke to Mary Cline/daughter on phone on 02/05/2019 Disposition Plan: Home in 1 to 3 days once clinically improved and cleared by GI.  Consultants: GI  Procedures: None  Antimicrobials:  Anti-infectives (From admission, onward)   Start     Dose/Rate Route Frequency Ordered Stop   02/04/19 0445  cefTRIAXone (ROCEPHIN) 2 g in sodium chloride 0.9 % 100 mL IVPB     2 g 200 mL/hr over 30 Minutes Intravenous Every 24 hours 02/03/19 0629     02/03/19 1400  metroNIDAZOLE (FLAGYL) IVPB 500 mg     500 mg 100 mL/hr over 60 Minutes Intravenous Every 8 hours 02/03/19 0629     02/03/19 0545  metroNIDAZOLE (FLAGYL) IVPB 500 mg     500 mg 100 mL/hr over 60 Minutes Intravenous  Once 02/03/19 0541 02/03/19 0712   02/03/19 0445  cefTRIAXone (ROCEPHIN) 1 g in sodium chloride 0.9 % 100 mL IVPB     1 g 200 mL/hr over 30 Minutes Intravenous  Once 02/03/19 0438 02/03/19 [id number]        Subjective: Patient seen and examined at bedside.  Still having some intermittent nausea and vomiting.  Still has some on and off abdominal pain.  Does not feel well enough to go home today.  Seems okay to try soft diet today.  No overnight fever. Objective: Vitals:   02/05/19 2050 02/05/19 2136 02/06/19 0351 02/06/19 0645  BP: 129/78 119/73 112/69   Pulse: 90 90 90   Resp: (!) 24 (!) 22    Temp: 98.1 F (36.7 C)  TempSrc:      SpO2: 97% 98% 95%   Weight:    48.1 kg  Height:        Intake/Output Summary (Last 24 hours) at 02/06/2019 0758 Last data filed at 02/06/2019 0600 Gross per 24 hour  Intake 1233 ml  Output 2 ml  Net 1231 ml   Filed Weights   02/04/19 0659 02/05/19 0405 02/06/19 0645  Weight: 48.7 kg 51.1 kg 48.1 kg    Examination:  General exam: No distress.  Looks chronically ill and very thinly built. Respiratory system: Bilateral decreased breath sounds at bases with some  scattered crackles Cardiovascular system: Rate controlled, S1-S2 heard Gastrointestinal system: Abdomen is nondistended, soft and mildly tender in the lower abdomen. Normal bowel sounds heard. Extremities: No cyanosis; trace edema  Data Reviewed: I have personally reviewed following labs and imaging studies  CBC: Recent Labs  Lab 02/02/19 1753  02/03/19 0754 02/03/19 2103 02/04/19 0201 02/05/19 0206 02/06/19 0314  WBC 30.6*  --  29.0*  --  23.2* 21.7* 21.6*  NEUTROABS  --   --   --   --  5.1  --   --   HGB 11.5*   < > 10.0* 10.4* 9.4* 9.1* 9.7*  HCT 35.9*   < > 30.9* 32.1* 29.4* 27.9* 30.2*  MCV 99.7  --  99.7  --  100.3* 98.9 100.7*  PLT 146*  --  124*  --  116* 121* 139*   < > = values in this interval not displayed.   Basic Metabolic Panel: Recent Labs  Lab 02/02/19 1753 02/03/19 0754 02/04/19 0201 02/05/19 0206 02/06/19 0314  NA 138 139 138 142 139  K 5.2* 4.6 4.9 4.2 3.9  CL 103 105 107 112* 108  CO2 26 24 24  20* 20*  GLUCOSE 151* 162* 117* 110* 136*  BUN 29* 35* 35* 28* 25*  CREATININE 1.24* 1.40* 1.30* 1.17* 1.25*  CALCIUM 9.3 8.6* 8.1* 8.1* 8.3*  MG  --   --   --   --  1.9   GFR: Estimated Creatinine Clearance: 18.4 mL/min (A) (by C-G formula based on SCr of 1.25 mg/dL (H)). Liver Function Tests: Recent Labs  Lab 02/02/19 1753 02/03/19 0754 02/04/19 0201  AST 18 16 21   ALT 12 12 12   ALKPHOS 61 48 40  BILITOT 0.9 0.7 0.7  PROT 6.1* 5.5* 5.1*  ALBUMIN 3.5 3.0* 2.7*   Recent Labs  Lab 02/02/19 1753  LIPASE 34   No results for input(s): AMMONIA in the last 168 hours. Coagulation Profile: Recent Labs  Lab 02/03/19 0429  INR 1.2   Cardiac Enzymes: No results for input(s): CKTOTAL, CKMB, CKMBINDEX, TROPONINI in the last 168 hours. BNP (last 3 results) No results for input(s): PROBNP in the last 8760 hours. HbA1C: No results for input(s): HGBA1C in the last 72 hours. CBG: No results for input(s): GLUCAP in the last 168 hours. Lipid Profile:  No results for input(s): CHOL, HDL, LDLCALC, TRIG, CHOLHDL, LDLDIRECT in the last 72 hours. Thyroid Function Tests: No results for input(s): TSH, T4TOTAL, FREET4, T3FREE, THYROIDAB in the last 72 hours. Anemia Panel: No results for input(s): VITAMINB12, FOLATE, FERRITIN, TIBC, IRON, RETICCTPCT in the last 72 hours. Sepsis Labs: Recent Labs  Lab 02/03/19 0426 02/03/19 0628  LATICACIDVEN 2.7* 1.9    Recent Results (from the past 240 hour(s))  Urine culture     Status: Abnormal   Collection Time: 02/02/19  9:29 PM   Specimen: Urine, Random  Result  Value Ref Range Status   Specimen Description URINE, RANDOM  Final   Special Requests   Final    NONE Performed at [facility], [place] [address] [place], [place] [address]    Culture (A)  Final    60,000 COLONIES/mL METHICILLIN RESISTANT STAPHYLOCOCCUS AUREUS   Report Status 02/05/2019 FINAL  Final   Organism ID, Bacteria METHICILLIN RESISTANT STAPHYLOCOCCUS AUREUS (A)  Final      Susceptibility   Methicillin resistant staphylococcus aureus - MIC*    CIPROFLOXACIN >=8 RESISTANT Resistant     GENTAMICIN <=0.5 SENSITIVE Sensitive     NITROFURANTOIN 32 SENSITIVE Sensitive     OXACILLIN >=4 RESISTANT Resistant     TETRACYCLINE <=1 SENSITIVE Sensitive     VANCOMYCIN 1 SENSITIVE Sensitive     TRIMETH/SULFA <=10 SENSITIVE Sensitive     CLINDAMYCIN <=0.25 SENSITIVE Sensitive     RIFAMPIN <=0.5 SENSITIVE Sensitive     Inducible Clindamycin NEGATIVE Sensitive     * 60,000 COLONIES/mL METHICILLIN RESISTANT STAPHYLOCOCCUS AUREUS  Blood culture (routine x 2)     Status: Abnormal   Collection Time: 02/03/19  4:25 AM   Specimen: BLOOD LEFT FOREARM  Result Value Ref Range Status   Specimen Description BLOOD LEFT FOREARM  Final   Special Requests   Final    BOTTLES DRAWN AEROBIC AND ANAEROBIC Blood Culture adequate volume   Culture  Setup Time   Final    GRAM POSITIVE RODS AEROBIC BOTTLE ONLY CRITICAL RESULT CALLED TO, READ BACK BY AND  VERIFIED WITH: PHARMD JEREMY FRENS  [id number] FCP     Culture (A)  Final    DIPHTHEROIDS(CORYNEBACTERIUM SPECIES) Standardized susceptibility testing for this organism is not available. Performed at [facility], [place] [address], [place], [place] [address]    Report Status 02/06/2019 FINAL  Final  SARS CORONAVIRUS 2 (TAT 6-24 HRS) Nasopharyngeal Nasopharyngeal Swab     Status: None   Collection Time: 02/03/19  4:35 AM   Specimen: Nasopharyngeal Swab  Result Value Ref Range Status   SARS Coronavirus 2 NEGATIVE NEGATIVE Final    Comment: (NOTE) SARS-CoV-2 target nucleic acids are NOT DETECTED. The SARS-CoV-2 RNA is generally detectable in upper and lower respiratory specimens during the acute phase of infection. Negative results do not preclude SARS-CoV-2 infection, do not rule out co-infections with other pathogens, and should not be used as the sole basis for treatment or other patient management decisions. Negative results must be combined with clinical observations, patient history, and epidemiological information. The expected result is Negative. Fact Sheet for Patients: SugarRoll.be Fact Sheet for Healthcare Providers: https://www.woods-mathews.com/ This test is not yet approved or cleared by the [place] FDA and  has been authorized for detection and/or diagnosis of SARS-CoV-2 by FDA under an Emergency Use Authorization (EUA). This EUA will remain  in effect (meaning this test can be used) for the duration of the COVID-19 declaration under Section 56 4(b)(1) of the Act, 21 U.S.C. section 360bbb-3(b)(1), unless the authorization is terminated or revoked sooner. Performed at [facility], [place] [address] [place], [place] [address]   Gastrointestinal Panel by PCR , Stool     Status: None   Collection Time: 02/03/19  5:00 AM   Specimen: Stool  Result Value Ref Range Status   Campylobacter species NOT DETECTED NOT  DETECTED Final   Plesimonas shigelloides NOT DETECTED NOT DETECTED Final   Salmonella species NOT DETECTED NOT DETECTED Final   Yersinia enterocolitica NOT DETECTED NOT  DETECTED Final   Vibrio species NOT DETECTED NOT DETECTED Final   Vibrio cholerae NOT DETECTED NOT DETECTED Final   Enteroaggregative E coli (EAEC) NOT DETECTED NOT DETECTED Final   Enteropathogenic E coli (EPEC) NOT DETECTED NOT DETECTED Final   Enterotoxigenic E coli (ETEC) NOT DETECTED NOT DETECTED Final   Shiga like toxin producing E coli (STEC) NOT DETECTED NOT DETECTED Final   Shigella/Enteroinvasive E coli (EIEC) NOT DETECTED NOT DETECTED Final   Cryptosporidium NOT DETECTED NOT DETECTED Final   Cyclospora cayetanensis NOT DETECTED NOT DETECTED Final   Entamoeba histolytica NOT DETECTED NOT DETECTED Final   Giardia lamblia NOT DETECTED NOT DETECTED Final   Adenovirus F40/41 NOT DETECTED NOT DETECTED Final   Astrovirus NOT DETECTED NOT DETECTED Final   Norovirus GI/GII NOT DETECTED NOT DETECTED Final   Rotavirus A NOT DETECTED NOT DETECTED Final   Sapovirus (I, II, IV, and V) NOT DETECTED NOT DETECTED Final    Comment: Performed at [facility], [place] [address] [place], [place] [address]  C Difficile Quick Screen w PCR reflex     Status: None   Collection Time: 02/03/19  5:00 AM  Result Value Ref Range Status   C Diff antigen NEGATIVE NEGATIVE Final   C Diff toxin NEGATIVE NEGATIVE Final   C Diff interpretation No C. difficile detected.  Final    Comment: Performed at [facility], [place] [address], [place], [place] [address]  Blood culture (routine x 2)     Status: None (Preliminary result)   Collection Time: 02/03/19  5:15 AM   Specimen: BLOOD RIGHT FOREARM  Result Value Ref Range Status   Specimen Description BLOOD RIGHT FOREARM  Final   Special Requests   Final    BOTTLES DRAWN AEROBIC AND ANAEROBIC Blood Culture adequate volume   Culture   Final    NO GROWTH 3 DAYS Performed at [facility], [place] [address], [place], [place] [address]    Report Status PENDING  Incomplete         Radiology Studies: No results found.      Scheduled Meds: . carvedilol  6.25 mg Oral BID  . sodium chloride flush  3 mL Intravenous Once  . sodium chloride flush  3 mL Intravenous Q12H  . sodium chloride flush  3 mL Intravenous Q12H   Continuous Infusions: . sodium chloride 250 mL (02/05/19 2215)  . cefTRIAXone (ROCEPHIN)  IV 2 g (02/06/19 0445)  . metronidazole 500 mg (02/06/19 0558)          Aline August, MD [facility] 02/06/2019, 7:58 AM

## 2019-02-06 NOTE — Progress Notes (Signed)
Subjective: No further blood in stool. Abdominal pain improving. Tolerating soft diet.  Objective: Vital signs in last 24 hours: Temp:  [98 F (36.7 C)-98.1 F (36.7 C)] 98 F (36.7 C) (11/08 1438) Pulse Rate:  [90-101] 101 (11/08 1438) Resp:  [18-24] 18 (11/08 1438) BP: (112-129)/(69-78) 119/76 (11/08 1438) SpO2:  [95 %-98 %] 96 % (11/08 1438) Weight:  [48.1 kg] 48.1 kg (11/08 0645) Weight change: -3 kg Last BM Date: 02/06/19  PE: GEN:  Thin, cachectic, pale-appearing ABD:  Soft, minimal lower abdominal tenderness without peritonitis  Lab Results: CBC    Component Value Date/Time   WBC 21.6 (H) 02/06/2019 0314   RBC 3.00 (L) 02/06/2019 0314   HGB 9.7 (L) 02/06/2019 0314   HGB 11.7 04/14/2017 1414   HGB 12.4 10/14/2016 1404   HCT 30.2 (L) 02/06/2019 0314   HCT 37.5 10/14/2016 1404   PLT 139 (L) 02/06/2019 0314   PLT 130 (L) 04/14/2017 1414   PLT 142 (L) 10/14/2016 1404   MCV 100.7 (H) 02/06/2019 0314   MCV 94.6 10/14/2016 1404   MCH 32.3 02/06/2019 0314   MCHC 32.1 02/06/2019 0314   RDW 12.1 02/06/2019 0314   RDW 13.5 10/14/2016 1404   LYMPHSABS 17.2 (H) 02/04/2019 0201   LYMPHSABS 6.8 (H) 10/14/2016 1404   MONOABS 0.7 02/04/2019 0201   MONOABS 0.3 10/14/2016 1404   EOSABS 0.1 02/04/2019 0201   EOSABS 0.1 10/14/2016 1404   BASOSABS 0.0 02/04/2019 0201   BASOSABS 0.0 10/14/2016 1404   CMP     Component Value Date/Time   NA 139 02/06/2019 0314   NA 142 08/11/2017 1028   NA 142 10/14/2016 1404   K 3.9 02/06/2019 0314   K 4.6 10/14/2016 1404   CL 108 02/06/2019 0314   CO2 20 (L) 02/06/2019 0314   CO2 28 10/14/2016 1404   GLUCOSE 136 (H) 02/06/2019 0314   GLUCOSE 98 10/14/2016 1404   BUN 25 (H) 02/06/2019 0314   BUN 27 08/11/2017 1028   BUN 21.4 10/14/2016 1404   CREATININE 1.25 (H) 02/06/2019 0314   CREATININE 0.81 04/12/2018 1335   CREATININE 1.0 10/14/2016 1404   CALCIUM 8.3 (L) 02/06/2019 0314   CALCIUM 9.2 10/14/2016 1404   PROT 5.1 (L)  02/04/2019 0201   PROT 6.0 08/11/2017 1028   PROT 6.3 (L) 10/14/2016 1404   ALBUMIN 2.7 (L) 02/04/2019 0201   ALBUMIN 3.8 08/11/2017 1028   ALBUMIN 3.6 10/14/2016 1404   AST 21 02/04/2019 0201   AST 14 (L) 04/12/2018 1335   AST 15 10/14/2016 1404   ALT 12 02/04/2019 0201   ALT 10 04/12/2018 1335   ALT 10 10/14/2016 1404   ALKPHOS 40 02/04/2019 0201   ALKPHOS 61 10/14/2016 1404   BILITOT 0.7 02/04/2019 0201   BILITOT 0.5 04/12/2018 1335   BILITOT 0.46 10/14/2016 1404   GFRNONAA 37 (L) 02/06/2019 0314   GFRNONAA >60 04/12/2018 1335   GFRAA 43 (L) 02/06/2019 0314   GFRAA >60 04/12/2018 1335    Assessment:  1. Abdominal pain, likely diverticulitis. Ischemic colitis or segmental colitis associated with diverticulosis (SCAD) are also possible, but seem less likely. 2. Abnormal CT scan, sigmoid diverticulosis with suspected diverticulitis. 3. Hematochezia, resolved. 4. Leukocytosis, improving, likely from #1/#2 above. 5. Anemia, suspected acute blood losswith some possible dilutional component.  Stable over past 3 days. 6. Chronic large hiatal hernia, maintained on essentially gastroparesis-type diet as outpatient. 7. Altered mental status, resolved.  Plan:  1.  Stop IV antibiotics,  and transition to oral antibiotics:  Patient has numerous allergies/intolerances, including to PCN, tetracycline, sulfa, cipro.  Will try ceftin 250 mg po bid.  2.  Advance to regular diet. 3.  OOBTC, ambulate as tolerated.  PT eval needed? 4.  If does ok tomorrow with above measures, could likely discharge home tomorrow to complete 10-day total course of antibiotics. 5.  I will arrange outpatient follow-up visit with patient in 4-6 weeks. 6.  [facility] will sign-off; please call with questions; thank you for the consultation.   Landry Dyke 02/06/2019, 3:16 PM   Cell [phone] If no answer or after 5 PM call [phone]

## 2019-02-06 NOTE — Progress Notes (Signed)
Patient threw up a minimal amount of clear fluid after drinking some water. No blood or dark colored material. Likely triggered gag reflex while swallowing. No adverse reactions. Patient trying to go back to sleep now.

## 2019-02-07 ENCOUNTER — Inpatient Hospital Stay (HOSPITAL_COMMUNITY): Payer: Medicare PPO

## 2019-02-07 DIAGNOSIS — I5023 Acute on chronic systolic (congestive) heart failure: Secondary | ICD-10-CM

## 2019-02-07 LAB — URINALYSIS, ROUTINE W REFLEX MICROSCOPIC
Bilirubin Urine: NEGATIVE
Glucose, UA: NEGATIVE mg/dL
Ketones, ur: NEGATIVE mg/dL
Leukocytes,Ua: NEGATIVE
Nitrite: NEGATIVE
Protein, ur: NEGATIVE mg/dL
Specific Gravity, Urine: 1.005 (ref 1.005–1.030)
pH: 5 (ref 5.0–8.0)

## 2019-02-07 LAB — BASIC METABOLIC PANEL
Anion gap: 11 (ref 5–15)
BUN: 30 mg/dL — ABNORMAL HIGH (ref 8–23)
CO2: 20 mmol/L — ABNORMAL LOW (ref 22–32)
Calcium: 8.2 mg/dL — ABNORMAL LOW (ref 8.9–10.3)
Chloride: 108 mmol/L (ref 98–111)
Creatinine, Ser: 1.27 mg/dL — ABNORMAL HIGH (ref 0.44–1.00)
GFR calc Af Amer: 42 mL/min — ABNORMAL LOW (ref >60)
GFR calc non Af Amer: 36 mL/min — ABNORMAL LOW (ref >60)
Glucose, Bld: 158 mg/dL — ABNORMAL HIGH (ref 70–99)
Potassium: 4.2 mmol/L (ref 3.5–5.1)
Sodium: 139 mmol/L (ref 135–145)

## 2019-02-07 LAB — HEPATIC FUNCTION PANEL
ALT: 15 U/L (ref 0–44)
AST: 39 U/L (ref 15–41)
Albumin: 3 g/dL — ABNORMAL LOW (ref 3.5–5.0)
Alkaline Phosphatase: 39 U/L (ref 38–126)
Bilirubin, Direct: 0.2 mg/dL (ref 0.0–0.2)
Indirect Bilirubin: 0.6 mg/dL (ref 0.3–0.9)
Total Bilirubin: 0.8 mg/dL (ref 0.3–1.2)
Total Protein: 5.3 g/dL — ABNORMAL LOW (ref 6.5–8.1)

## 2019-02-07 LAB — SURGICAL PCR SCREEN
MRSA, PCR: POSITIVE — AB
Staphylococcus aureus: POSITIVE — AB

## 2019-02-07 LAB — CBC
HCT: 29.5 % — ABNORMAL LOW (ref 36.0–46.0)
Hemoglobin: 9.4 g/dL — ABNORMAL LOW (ref 12.0–15.0)
MCH: 32.3 pg (ref 26.0–34.0)
MCHC: 31.9 g/dL (ref 30.0–36.0)
MCV: 101.4 fL — ABNORMAL HIGH (ref 80.0–100.0)
Platelets: 138 10*3/uL — ABNORMAL LOW (ref 150–400)
RBC: 2.91 MIL/uL — ABNORMAL LOW (ref 3.87–5.11)
RDW: 12.3 % (ref 11.5–15.5)
WBC: 26.8 10*3/uL — ABNORMAL HIGH (ref 4.0–10.5)
nRBC: 0.1 % (ref 0.0–0.2)

## 2019-02-07 LAB — MAGNESIUM: Magnesium: 1.8 mg/dL (ref 1.7–2.4)

## 2019-02-07 MED ORDER — CHLORHEXIDINE GLUCONATE CLOTH 2 % EX PADS
6.0000 | MEDICATED_PAD | Freq: Every day | CUTANEOUS | Status: DC
  Administered 2019-02-07 – 2019-02-09 (×3): 6 via TOPICAL

## 2019-02-07 MED ORDER — FUROSEMIDE 10 MG/ML IJ SOLN
40.0000 mg | Freq: Once | INTRAMUSCULAR | Status: AC
  Administered 2019-02-07: 40 mg via INTRAVENOUS
  Filled 2019-02-07: qty 4

## 2019-02-07 MED ORDER — MUPIROCIN 2 % EX OINT
1.0000 "application " | TOPICAL_OINTMENT | Freq: Two times a day (BID) | CUTANEOUS | Status: DC
  Administered 2019-02-07 – 2019-02-09 (×6): 1 via NASAL
  Filled 2019-02-07 (×2): qty 22

## 2019-02-07 MED ORDER — METRONIDAZOLE 50 MG/ML ORAL SUSPENSION
500.0000 mg | Freq: Three times a day (TID) | ORAL | Status: DC
  Administered 2019-02-07 – 2019-02-09 (×9): 500 mg via ORAL
  Filled 2019-02-07 (×10): qty 10

## 2019-02-07 NOTE — Progress Notes (Signed)
Physical Therapy Treatment Patient Details Name: Mary Cline MRN: [id number] DOB: [birth year] Today's Date: 02/07/2019    History of Present Illness 85 y.o. female with medical history significant for chronic systolic CHF with EF 20 to 25%, dementia, hypertension, and chronic kidney disease stage III. She presented to the emergency department with 1 day of lower abdominal pain, nausea, and bloody diarrhea. Pt admitted for diverticulitis.    PT Comments    Pt performed gt training and functional mobility during session.  She required min assistance and presents with LOB initially.  Plan remains appropriate for no PT follow up at baseline and to return home with her daughter. Will continue to follow acutely to maximize functional gains to decrease caregiver burden.      Follow Up Recommendations  No PT follow up;Supervision/Assistance - 24 hour     Equipment Recommendations  None recommended by PT    Recommendations for Other Services       Precautions / Restrictions Precautions Precautions: Fall Precaution Comments: Pt fell in May 2020 sustaining L wrist fx. Restrictions Weight Bearing Restrictions: No    Mobility  Bed Mobility Overal bed mobility: Needs Assistance Bed Mobility: Supine to Sit;Sit to Supine     Supine to sit: Min assist;Mod assist(mod to scoot to edge of bed.) Sit to supine: Min guard   General bed mobility comments: Pt required min to mod to come to edge of bed.  Transfers Overall transfer level: Needs assistance Equipment used: Rolling walker (2 wheeled) Transfers: Sit to/from Stand Sit to Stand: Min assist         General transfer comment: min assistance to boost into standing.  Ambulation/Gait Ambulation/Gait assistance: Min assist Gait Distance (Feet): 60 Feet Assistive device: Rolling walker (2 wheeled) Gait Pattern/deviations: Step-through pattern;Decreased stride length Gait velocity: decreased   General Gait Details: Pt flexed  forward with  poor RW management.   Stairs             Wheelchair Mobility    Modified Rankin (Stroke Patients Only)       Balance Overall balance assessment: Needs assistance   Sitting balance-Leahy Scale: Fair       Standing balance-Leahy Scale: Poor Standing balance comment: reliant on RW                            Cognition Arousal/Alertness: Awake/alert Behavior During Therapy: WFL for tasks assessed/performed Overall Cognitive Status: History of cognitive impairments - at baseline                                 General Comments: h/o dementia      Exercises      General Comments        Pertinent Vitals/Pain Pain Assessment: No/denies pain    Home Living                      Prior Function            PT Goals (current goals can now be found in the care plan section) Acute Rehab PT Goals Patient Stated Goal: home Potential to Achieve Goals: Good Progress towards PT goals: Progressing toward goals    Frequency    Min 3X/week      PT Plan Current plan remains appropriate    Co-evaluation  AM-PAC PT "6 Clicks" Mobility   Outcome Measure  Help needed turning from your back to your side while in a flat bed without using bedrails?: A Little Help needed moving from lying on your back to sitting on the side of a flat bed without using bedrails?: A Little Help needed moving to and from a bed to a chair (including a wheelchair)?: A Little Help needed standing up from a chair using your arms (e.g., wheelchair or bedside chair)?: A Little Help needed to walk in hospital room?: A Little Help needed climbing 3-5 steps with a railing? : A Lot 6 Click Score: 17    End of Session Equipment Utilized During Treatment: Gait belt Activity Tolerance: Patient tolerated treatment well Patient left: in bed;with family/visitor present;with call bell/phone within reach;with bed alarm set Nurse  Communication: Mobility status PT Visit Diagnosis: Muscle weakness (generalized) (M62.81)     Time: [id number] PT Time Calculation (min) (ACUTE ONLY): 19 min  Charges:  $Gait Training: 8-22 mins                     [name], PTA Acute Rehabilitation Services Pager [phone] Office [phone]     [name] 02/07/2019, 5:00 PM

## 2019-02-07 NOTE — Progress Notes (Signed)
Patient ID: Mary Cline, female   DOB: [birth year], 85 y.o.   MRN: [id number]  PROGRESS NOTE    Mary Cline  [id number] DOB: April 19, 1922 DOA: 02/02/2019 PCP: [name], MD   Brief Narrative:  85 year old female with history of chronic systolic CHF 20 to [id number], dementia, hypertension, chronic kidney disease stage III presented with abdominal pain, nausea and bloody diarrhea.  She was found to have leukocytosis and CT abdomen and pelvis showed sigmoid thickening concerning for diverticulitis.  She was started on Rocephin and Flagyl.  GI was consulted.  Assessment & Plan:   Probable acute sigmoid diverticulitis presenting with hematochezia and abdominal pain -Treated with Rocephin and Flagyl and switched to oral cefdinir and Flagyl by GI on 02/06/2019. -Monitor hemoglobin.  Hemoglobin 9.4 today.  No more hematochezia.  Diarrhea is improving. -Tolerating diet but complaining of right upper quadrant pain.  Will get right upper quadrant ultrasound.  Check LFTs for today.  Sepsis: Present on admission Leukocytosis -Currently hemodynamically stable.  Cultures negative so far except for one blood culture positive for gram-positive rods, possibly contaminant -WBCs worsening, today 26.8.  Anemia of chronic disease -Probably from chronic kidney disease.  Hemoglobin around 10 at baseline.  Monitor.  Acute on chronic systolic heart failure  - EF of 20 to 25% in echo of March 2020. -Continue Coreg. -Chest x-ray from 02/06/2019 had shown interstitial opacities representing edema.  Will repeat chest x-ray with abdominal x-ray this morning.  Patient received a dose of intravenous Lasix.  Will probably give 1 more dose of Lasix today.  Thrombocytopenia  -Questionable cause.  Monitor.  Chronic kidney disease stage IIIa -Baseline creatinine 1-1.2.  Stable.  Monitor  Essential hypertension -Continue Coreg.  Blood pressure stable.  ?  Episode of apnea in the ER -Required  supplemental oxygen initially but currently on room air.  Patient has had similar episodes at home apparently and has chronically elevated left diaphragm.   May have underlying sleep apnea   DVT prophylaxis: SCDs Code Status: Full Family Communication: Spoke to [name]/daughter on phone on 02/07/2019 Disposition Plan: Home in 1 to 2 days once clinically improved  Consultants: GI  Procedures: None  Antimicrobials:  Anti-infectives (From admission, onward)   Start     Dose/Rate Route Frequency Ordered Stop   02/07/19 0915  metroNIDAZOLE (FLAGYL) 50 mg/ml oral suspension 500 mg     500 mg Oral Every 8 hours 02/07/19 0900     02/06/19 1530  cefdinir (OMNICEF) 250 MG/5ML suspension 300 mg    Note to Pharmacy: Has done ok without side effects with ceftriaxone.   300 mg Oral Daily 02/06/19 1527     02/04/19 0445  cefTRIAXone (ROCEPHIN) 2 g in sodium chloride 0.9 % 100 mL IVPB  Status:  Discontinued     2 g 200 mL/hr over 30 Minutes Intravenous Every 24 hours 02/03/19 0629 02/06/19 1526   02/03/19 1400  metroNIDAZOLE (FLAGYL) IVPB 500 mg  Status:  Discontinued     500 mg 100 mL/hr over 60 Minutes Intravenous Every 8 hours 02/03/19 0629 02/07/19 0900   02/03/19 0545  metroNIDAZOLE (FLAGYL) IVPB 500 mg     500 mg 100 mL/hr over 60 Minutes Intravenous  Once 02/03/19 0541 02/03/19 0712   02/03/19 0445  cefTRIAXone (ROCEPHIN) 1 g in sodium chloride 0.9 % 100 mL IVPB     1 g 200 mL/hr over 30 Minutes Intravenous  Once 02/03/19 0438 02/03/19 0611        Subjective:  Patient seen and examined at bedside.  States that her diarrhea is improving but now complains of right upper quadrant pain.  Does not feel well enough to go home today.  No overnight fever or vomiting.   Objective: Vitals:   02/06/19 1438 02/07/19 0015 02/07/19 0700 02/07/19 1013  BP: 119/76 126/76  113/74  Pulse: (!) 101 90  (!) 101  Resp: 18 18  18   Temp: 98 F (36.7 C) 99.2 F (37.3 C)  98.6 F (37 C)  TempSrc:  Oral   Oral  SpO2: 96% 94%  98%  Weight:   48.1 kg   Height:        Intake/Output Summary (Last 24 hours) at 02/07/2019 1033 Last data filed at 02/07/2019 0000 Gross per 24 hour  Intake 375.94 ml  Output 100 ml  Net 275.94 ml   Filed Weights   02/05/19 0405 02/06/19 0645 02/07/19 0700  Weight: 51.1 kg 48.1 kg 48.1 kg    Examination:  General exam: No acute distress.  Looks chronically ill and very thinly built. Respiratory system: Bilateral decreased breath sounds at bases with scattered crackles.  No wheezing  cardiovascular system: S1-S2 heard, rate controlled Gastrointestinal system: Abdomen is nondistended, soft and mildly tender in the right upper quadrant.  Normal bowel sounds heard. Extremities: No cyanosis; trace edema  Data Reviewed: I have personally reviewed following labs and imaging studies  CBC: Recent Labs  Lab 02/03/19 0754 02/03/19 2103 02/04/19 0201 02/05/19 0206 02/06/19 0314 02/07/19 0225  WBC 29.0*  --  23.2* 21.7* 21.6* 26.8*  NEUTROABS  --   --  5.1  --   --   --   HGB 10.0* 10.4* 9.4* 9.1* 9.7* 9.4*  HCT 30.9* 32.1* 29.4* 27.9* 30.2* 29.5*  MCV 99.7  --  100.3* 98.9 100.7* 101.4*  PLT 124*  --  116* 121* 139* 0000000*   Basic Metabolic Panel: Recent Labs  Lab 02/03/19 0754 02/04/19 0201 02/05/19 0206 02/06/19 0314 02/07/19 0225  NA 139 138 142 139 139  K 4.6 4.9 4.2 3.9 4.2  CL 105 107 112* 108 108  CO2 24 24 20* 20* 20*  GLUCOSE 162* 117* 110* 136* 158*  BUN 35* 35* 28* 25* 30*  CREATININE 1.40* 1.30* 1.17* 1.25* 1.27*  CALCIUM 8.6* 8.1* 8.1* 8.3* 8.2*  MG  --   --   --  1.9 1.8   GFR: Estimated Creatinine Clearance: 18.1 mL/min (A) (by C-G formula based on SCr of 1.27 mg/dL (H)). Liver Function Tests: Recent Labs  Lab 02/02/19 1753 02/03/19 0754 02/04/19 0201 02/07/19 0225  AST 18 16 21  39  ALT 12 12 12 15   ALKPHOS 61 48 40 39  BILITOT 0.9 0.7 0.7 0.8  PROT 6.1* 5.5* 5.1* 5.3*  ALBUMIN 3.5 3.0* 2.7* 3.0*   Recent Labs  Lab  02/02/19 1753  LIPASE 34   No results for input(s): AMMONIA in the last 168 hours. Coagulation Profile: Recent Labs  Lab 02/03/19 0429  INR 1.2   Cardiac Enzymes: No results for input(s): CKTOTAL, CKMB, CKMBINDEX, TROPONINI in the last 168 hours. BNP (last 3 results) No results for input(s): PROBNP in the last 8760 hours. HbA1C: No results for input(s): HGBA1C in the last 72 hours. CBG: No results for input(s): GLUCAP in the last 168 hours. Lipid Profile: No results for input(s): CHOL, HDL, LDLCALC, TRIG, CHOLHDL, LDLDIRECT in the last 72 hours. Thyroid Function Tests: No results for input(s): TSH, T4TOTAL, FREET4, T3FREE, THYROIDAB in the last  72 hours. Anemia Panel: No results for input(s): VITAMINB12, FOLATE, FERRITIN, TIBC, IRON, RETICCTPCT in the last 72 hours. Sepsis Labs: Recent Labs  Lab 02/03/19 0426 02/03/19 0628  LATICACIDVEN 2.7* 1.9    Recent Results (from the past 240 hour(s))  Urine culture     Status: Abnormal   Collection Time: 02/02/19  9:29 PM   Specimen: Urine, Random  Result Value Ref Range Status   Specimen Description URINE, RANDOM  Final   Special Requests   Final    NONE Performed at [facility], [address] [place], [place] [address]    Culture (A)  Final    60,000 COLONIES/mL METHICILLIN RESISTANT STAPHYLOCOCCUS AUREUS   Report Status 02/05/2019 FINAL  Final   Organism ID, Bacteria METHICILLIN RESISTANT STAPHYLOCOCCUS AUREUS (A)  Final      Susceptibility   Methicillin resistant staphylococcus aureus - MIC*    CIPROFLOXACIN >=8 RESISTANT Resistant     GENTAMICIN <=0.5 SENSITIVE Sensitive     NITROFURANTOIN 32 SENSITIVE Sensitive     OXACILLIN >=4 RESISTANT Resistant     TETRACYCLINE <=1 SENSITIVE Sensitive     VANCOMYCIN 1 SENSITIVE Sensitive     TRIMETH/SULFA <=10 SENSITIVE Sensitive     CLINDAMYCIN <=0.25 SENSITIVE Sensitive     RIFAMPIN <=0.5 SENSITIVE Sensitive     Inducible Clindamycin NEGATIVE Sensitive     *  60,000 COLONIES/mL METHICILLIN RESISTANT STAPHYLOCOCCUS AUREUS  Blood culture (routine x 2)     Status: Abnormal   Collection Time: 02/03/19  4:25 AM   Specimen: BLOOD LEFT FOREARM  Result Value Ref Range Status   Specimen Description BLOOD LEFT FOREARM  Final   Special Requests   Final    BOTTLES DRAWN AEROBIC AND ANAEROBIC Blood Culture adequate volume   Culture  Setup Time   Final    GRAM POSITIVE RODS AEROBIC BOTTLE ONLY CRITICAL RESULT CALLED TO, READ BACK BY AND VERIFIED WITH: PHARMD JEREMY FRENS  [id number] FCP     Culture (A)  Final    DIPHTHEROIDS(CORYNEBACTERIUM SPECIES) Standardized susceptibility testing for this organism is not available. Performed at [facility], [place] [address] [place], [place] [address]    Report Status 02/06/2019 FINAL  Final  SARS CORONAVIRUS 2 (TAT 6-24 HRS) Nasopharyngeal Nasopharyngeal Swab     Status: None   Collection Time: 02/03/19  4:35 AM   Specimen: Nasopharyngeal Swab  Result Value Ref Range Status   SARS Coronavirus 2 NEGATIVE NEGATIVE Final    Comment: (NOTE) SARS-CoV-2 target nucleic acids are NOT DETECTED. The SARS-CoV-2 RNA is generally detectable in upper and lower respiratory specimens during the acute phase of infection. Negative results do not preclude SARS-CoV-2 infection, do not rule out co-infections with other pathogens, and should not be used as the sole basis for treatment or other patient management decisions. Negative results must be combined with clinical observations, patient history, and epidemiological information. The expected result is Negative. Fact Sheet for Patients: SugarRoll.be Fact Sheet for Healthcare Providers: https://www.woods-mathews.com/ This test is not yet approved or cleared by the [place] FDA and  has been authorized for detection and/or diagnosis of SARS-CoV-2 by FDA under an Emergency Use Authorization (EUA). This EUA will remain  in  effect (meaning this test can be used) for the duration of the COVID-19 declaration under Section 56 4(b)(1) of the Act, 21 U.S.C. section 360bbb-3(b)(1), unless the authorization is terminated or revoked sooner. Performed at [facility], [place] [address], [place], [place]  [address]   Gastrointestinal Panel by PCR , Stool     Status: None   Collection Time: 02/03/19  5:00 AM   Specimen: Stool  Result Value Ref Range Status   Campylobacter species NOT DETECTED NOT DETECTED Final   Plesimonas shigelloides NOT DETECTED NOT DETECTED Final   Salmonella species NOT DETECTED NOT DETECTED Final   Yersinia enterocolitica NOT DETECTED NOT DETECTED Final   Vibrio species NOT DETECTED NOT DETECTED Final   Vibrio cholerae NOT DETECTED NOT DETECTED Final   Enteroaggregative E coli (EAEC) NOT DETECTED NOT DETECTED Final   Enteropathogenic E coli (EPEC) NOT DETECTED NOT DETECTED Final   Enterotoxigenic E coli (ETEC) NOT DETECTED NOT DETECTED Final   Shiga like toxin producing E coli (STEC) NOT DETECTED NOT DETECTED Final   Shigella/Enteroinvasive E coli (EIEC) NOT DETECTED NOT DETECTED Final   Cryptosporidium NOT DETECTED NOT DETECTED Final   Cyclospora cayetanensis NOT DETECTED NOT DETECTED Final   Entamoeba histolytica NOT DETECTED NOT DETECTED Final   Giardia lamblia NOT DETECTED NOT DETECTED Final   Adenovirus F40/41 NOT DETECTED NOT DETECTED Final   Astrovirus NOT DETECTED NOT DETECTED Final   Norovirus GI/GII NOT DETECTED NOT DETECTED Final   Rotavirus A NOT DETECTED NOT DETECTED Final   Sapovirus (I, II, IV, and V) NOT DETECTED NOT DETECTED Final    Comment: Performed at [facility], [address] [place], [place] [address]  C Difficile Quick Screen w PCR reflex     Status: None   Collection Time: 02/03/19  5:00 AM  Result Value Ref Range Status   C Diff antigen NEGATIVE NEGATIVE Final   C Diff toxin NEGATIVE NEGATIVE Final   C Diff interpretation No C. difficile  detected.  Final    Comment: Performed at [facility], [place] [address], [place], [place] [address]  Blood culture (routine x 2)     Status: None (Preliminary result)   Collection Time: 02/03/19  5:15 AM   Specimen: BLOOD RIGHT FOREARM  Result Value Ref Range Status   Specimen Description BLOOD RIGHT FOREARM  Final   Special Requests   Final    BOTTLES DRAWN AEROBIC AND ANAEROBIC Blood Culture adequate volume   Culture   Final    NO GROWTH 4 DAYS Performed at [facility], [place] [address] [place], [place] [address]    Report Status PENDING  Incomplete         Radiology Studies: Dg Chest Port 1 View  Result Date: 02/06/2019 CLINICAL DATA:  Shortness of breath. EXAM: PORTABLE CHEST 1 VIEW COMPARISON:  Chest radiograph 02/03/2019. FINDINGS: Stable cardiomegaly. Persistent elevation left hemidiaphragm. Interval increased bilateral interstitial pulmonary opacities. Persistent heterogeneous opacities left lung base. Possible trace bilateral pleural effusions. Cardiomegaly with increasing IMPRESSION: Interstitial opacities may represent edema. Persistent marked elevation left hemidiaphragm. Left basilar atelectasis. Electronically Signed   By: Lovey Newcomer M.D.   On: 02/06/2019 10:02   Dg Abd Acute 2+v W 1v Chest  Result Date: 02/07/2019 CLINICAL DATA:  Generalized abdominal pain and vomiting for 2 days. EXAM: DG ABDOMEN ACUTE W/ 1V CHEST COMPARISON:  September 04, 2015. FINDINGS: There is no evidence of dilated bowel loops or free intraperitoneal air. No radiopaque calculi or other significant radiographic abnormality is seen. Cardiomegaly is noted. Elevated left hemidiaphragm is noted. Mild bibasilar subsegmental atelectasis is noted. Central pulmonary vascular congestion is noted with possible bilateral pulmonary edema. Status post cholecystectomy. IMPRESSION: No evidence of bowel obstruction or ileus. Cardiomegaly is  noted with central pulmonary vascular congestion and probable bilateral  pulmonary edema. Elevated left hemidiaphragm is noted with bibasilar subsegmental atelectasis. Aortic Atherosclerosis (ICD10-I70.0). Electronically Signed   By: Marijo Conception M.D.   On: 02/07/2019 09:49        Scheduled Meds: . carvedilol  6.25 mg Oral BID  . cefdinir  300 mg Oral Daily  . metroNIDAZOLE  500 mg Oral Q8H  . sodium chloride flush  3 mL Intravenous Once  . sodium chloride flush  3 mL Intravenous Q12H  . sodium chloride flush  3 mL Intravenous Q12H   Continuous Infusions: . sodium chloride 5 mL/hr at 02/07/19 0000          Aline August, MD [facility] 02/07/2019, 10:33 AM

## 2019-02-07 NOTE — Progress Notes (Signed)
Patient had some complaints of right upper quadrante abdominal pain. MD. Starla Link, notify; pt has been NPO for ultrasound. Patient also had some burning sensation and discomfort when voiding MD. was notify and U/A and cultures were collected. Will continue to monitor.

## 2019-02-07 NOTE — Care Management Important Message (Signed)
Important Message  Patient Details  Name: Mary Cline MRN: [id number] Date of Birth: 06/03/1922   Medicare Important Message Given:  Yes     Tymeir Weathington 02/07/2019, 3:45 PM

## 2019-02-08 ENCOUNTER — Inpatient Hospital Stay (HOSPITAL_COMMUNITY): Payer: Medicare PPO

## 2019-02-08 DIAGNOSIS — I340 Nonrheumatic mitral (valve) insufficiency: Secondary | ICD-10-CM

## 2019-02-08 LAB — CBC WITH DIFFERENTIAL/PLATELET
Abs Immature Granulocytes: 0.05 10*3/uL (ref 0.00–0.07)
Basophils Absolute: 0 10*3/uL (ref 0.0–0.1)
Basophils Relative: 0 %
Eosinophils Absolute: 0 10*3/uL (ref 0.0–0.5)
Eosinophils Relative: 0 %
HCT: 26.7 % — ABNORMAL LOW (ref 36.0–46.0)
Hemoglobin: 8.8 g/dL — ABNORMAL LOW (ref 12.0–15.0)
Immature Granulocytes: 0 %
Lymphocytes Relative: 82 %
Lymphs Abs: 19.6 10*3/uL — ABNORMAL HIGH (ref 0.7–4.0)
MCH: 32.2 pg (ref 26.0–34.0)
MCHC: 33 g/dL (ref 30.0–36.0)
MCV: 97.8 fL (ref 80.0–100.0)
Monocytes Absolute: 0.7 10*3/uL (ref 0.1–1.0)
Monocytes Relative: 3 %
Neutro Abs: 3.5 10*3/uL (ref 1.7–7.7)
Neutrophils Relative %: 15 %
Platelets: 124 10*3/uL — ABNORMAL LOW (ref 150–400)
RBC: 2.73 MIL/uL — ABNORMAL LOW (ref 3.87–5.11)
RDW: 12.4 % (ref 11.5–15.5)
WBC Morphology: ABNORMAL
WBC: 24 10*3/uL — ABNORMAL HIGH (ref 4.0–10.5)
nRBC: 0.1 % (ref 0.0–0.2)

## 2019-02-08 LAB — MAGNESIUM: Magnesium: 1.6 mg/dL — ABNORMAL LOW (ref 1.7–2.4)

## 2019-02-08 LAB — BASIC METABOLIC PANEL
Anion gap: 9 (ref 5–15)
BUN: 29 mg/dL — ABNORMAL HIGH (ref 8–23)
CO2: 23 mmol/L (ref 22–32)
Calcium: 8.2 mg/dL — ABNORMAL LOW (ref 8.9–10.3)
Chloride: 108 mmol/L (ref 98–111)
Creatinine, Ser: 1.32 mg/dL — ABNORMAL HIGH (ref 0.44–1.00)
GFR calc Af Amer: 40 mL/min — ABNORMAL LOW (ref >60)
GFR calc non Af Amer: 34 mL/min — ABNORMAL LOW (ref >60)
Glucose, Bld: 151 mg/dL — ABNORMAL HIGH (ref 70–99)
Potassium: 3.3 mmol/L — ABNORMAL LOW (ref 3.5–5.1)
Sodium: 140 mmol/L (ref 135–145)

## 2019-02-08 LAB — CULTURE, BLOOD (ROUTINE X 2)
Culture: NO GROWTH
Special Requests: ADEQUATE

## 2019-02-08 LAB — ECHOCARDIOGRAM COMPLETE
Height: 57 in
Weight: 1788.37 oz

## 2019-02-08 MED ORDER — MAGNESIUM SULFATE 2 GM/50ML IV SOLN
2.0000 g | Freq: Once | INTRAVENOUS | Status: AC
  Administered 2019-02-08: 2 g via INTRAVENOUS
  Filled 2019-02-08: qty 50

## 2019-02-08 MED ORDER — FUROSEMIDE 10 MG/ML IJ SOLN
40.0000 mg | Freq: Once | INTRAMUSCULAR | Status: AC
  Administered 2019-02-08: 40 mg via INTRAVENOUS
  Filled 2019-02-08: qty 4

## 2019-02-08 MED ORDER — POTASSIUM CHLORIDE CRYS ER 20 MEQ PO TBCR
40.0000 meq | EXTENDED_RELEASE_TABLET | Freq: Once | ORAL | Status: AC
  Administered 2019-02-08: 40 meq via ORAL
  Filled 2019-02-08: qty 2

## 2019-02-08 NOTE — Progress Notes (Signed)
Patient ID: Mary Cline, female   DOB: [birth year], 86 y.o.   MRN: [id number] I was notified by the nurse that the daughter at bedside Vaughan Basta has spoken to her sister and they have decided that patient's CODE STATUS should be changed to DNR.  Will change the CODE STATUS to DNR.

## 2019-02-08 NOTE — Progress Notes (Signed)
Physical Therapy Treatment Patient Details Name: Mary Cline MRN: [id number] DOB: [birth year] Today's Date: 02/08/2019    History of Present Illness 85 y.o. female with medical history significant for chronic systolic CHF with EF 20 to 25%, dementia, hypertension, and chronic kidney disease stage III. She presented to the emergency department with 1 day of lower abdominal pain, nausea, and bloody diarrhea. Pt admitted for diverticulitis.    PT Comments    Pt reports she just got back to bed after using [employer] with nursing assist. Declining OOB at this time. Agreeable to exercises in bed. Pt reports feeling weaker and needing extra help transferring out of bed. PT to continue per POC.    Follow Up Recommendations  No PT follow up;Supervision/Assistance - 24 hour     Equipment Recommendations  None recommended by PT    Recommendations for Other Services       Precautions / Restrictions Precautions Precautions: Fall Precaution Comments: Pt fell in May 2020 sustaining L wrist fx. Restrictions Weight Bearing Restrictions: No    Mobility  Bed Mobility                  Transfers                    Ambulation/Gait                 Stairs             Wheelchair Mobility    Modified Rankin (Stroke Patients Only)       Balance                                            Cognition Arousal/Alertness: Awake/alert Behavior During Therapy: WFL for tasks assessed/performed Overall Cognitive Status: History of cognitive impairments - at baseline                                 General Comments: h/o dementia      Exercises General Exercises - Lower Extremity Ankle Circles/Pumps: AROM;Both;20 reps;Supine Gluteal Sets: AROM;Both;10 reps;Supine Heel Slides: AROM;Right;Left;10 reps;Supine Hip ABduction/ADduction: AROM;Both;10 reps;Supine Straight Leg Raises: AAROM;Right;Left;10 reps;Supine    General Comments         Pertinent Vitals/Pain Pain Assessment: No/denies pain    Home Living                      Prior Function            PT Goals (current goals can now be found in the care plan section) Acute Rehab PT Goals Patient Stated Goal: home Progress towards PT goals: Progressing toward goals    Frequency    Min 3X/week      PT Plan Current plan remains appropriate    Co-evaluation              AM-PAC PT "6 Clicks" Mobility   Outcome Measure  Help needed turning from your back to your side while in a flat bed without using bedrails?: A Little Help needed moving from lying on your back to sitting on the side of a flat bed without using bedrails?: A Little Help needed moving to and from a bed to a chair (including a wheelchair)?: A Little Help needed standing up from a chair using your arms (e.g.,  wheelchair or bedside chair)?: A Little Help needed to walk in hospital room?: A Little Help needed climbing 3-5 steps with a railing? : A Lot 6 Click Score: 17    End of Session Equipment Utilized During Treatment: Gait belt;Oxygen Activity Tolerance: Patient tolerated treatment well Patient left: in bed;with family/visitor present;with call bell/phone within reach Nurse Communication: Mobility status PT Visit Diagnosis: Muscle weakness (generalized) (M62.81)     Time: [id number] PT Time Calculation (min) (ACUTE ONLY): 14 min  Charges:  $Therapeutic Exercise: 8-22 mins                     Lorrin Goodell, PT  Office # [phone] Pager [phone]    [name] 02/08/2019, 12:43 PM

## 2019-02-08 NOTE — Progress Notes (Signed)
  Echocardiogram 2D Echocardiogram has been performed.  Magon Croson G Stephannie Broner 02/08/2019, 1:42 PM

## 2019-02-08 NOTE — Progress Notes (Signed)
Patient ID: Mary Cline, female   DOB: [birth year], 85 y.o.   MRN: [id number]  PROGRESS NOTE    ALEXIEA ROSSETTI  [id number] DOB: [birth year] DOA: 02/02/2019 PCP: [name], MD   Brief Narrative:  85 year old female with history of chronic systolic CHF 20 to [id number], dementia, hypertension, chronic kidney disease stage III presented with abdominal pain, nausea and bloody diarrhea.  She was found to have leukocytosis and CT abdomen and pelvis showed sigmoid thickening concerning for diverticulitis.  She was started on Rocephin and Flagyl.  GI was consulted.  Assessment & Plan:   Acute on chronic systolic heart failure  - EF of 20 to 25% in echo of March 2020. -Continue Coreg. -Patient has received intravenous Lasix 1 dose each for the last 2 days.  Complained of shortness of breath last night.  Does not feel well.  Chest x-ray yesterday had shown congestion.  Will repeat chest x-ray and probably give 1 more days of intravenous Lasix 40 mg.  Will repeat 2D echo.  Might need cardiology evaluation.  Probable acute sigmoid diverticulitis presenting with hematochezia and abdominal pain -Treated with Rocephin and Flagyl and switched to oral cefdinir and Flagyl by GI on 02/06/2019.  GI has signed off and recommended total of 10 days of antibiotics. -Monitor hemoglobin.  Hemoglobin 8.8 today.  No more hematochezia.  Diarrhea is improving. -She was complaining of right upper quadrant abdominal pain on 02/07/2019.  LFTs were normal.  Right upper quadrant ultrasound was negative for CBD stones.  Sepsis: Present on admission Leukocytosis -Currently hemodynamically stable.  Cultures negative so far except for one blood culture positive for diphtheroids, possibly contaminant -WBCs improving, today at 24.  Monitor.  Anemia of chronic disease -Probably from chronic kidney disease.  Hemoglobin around 10 at baseline.  Monitor.  Thrombocytopenia  -Questionable cause.  Monitor.  Chronic kidney  disease stage IIIa -Baseline creatinine 1-1.2.  Stable.  Monitor  Essential hypertension -Continue Coreg.  Blood pressure stable.  ?  Episode of apnea in the ER -Required supplemental oxygen initially but currently on room air.  Patient has had similar episodes at home apparently and has chronically elevated left diaphragm.   May have underlying sleep apnea  Generalized deconditioning -With her advanced age and systolic heart failure, her overall prognosis is guarded to poor.  I have spoken to patient's daughter few times regarding her CODE STATUS and patient remains a partial code.  Will request palliative care evaluation for goals of care discussion.   DVT prophylaxis: SCDs Code Status: Full Family Communication: Spoke to [name]/daughter on phone on 02/08/2019 Disposition Plan: Home in 1 to 2 days once clinically improved  Consultants: GI  Procedures: None  Antimicrobials:  Anti-infectives (From admission, onward)   Start     Dose/Rate Route Frequency Ordered Stop   02/07/19 0915  metroNIDAZOLE (FLAGYL) 50 mg/ml oral suspension 500 mg     500 mg Oral Every 8 hours 02/07/19 0900     02/06/19 1530  cefdinir (OMNICEF) 250 MG/5ML suspension 300 mg    Note to Pharmacy: Has done ok without side effects with ceftriaxone.   300 mg Oral Daily 02/06/19 1527     02/04/19 0445  cefTRIAXone (ROCEPHIN) 2 g in sodium chloride 0.9 % 100 mL IVPB  Status:  Discontinued     2 g 200 mL/hr over 30 Minutes Intravenous Every 24 hours 02/03/19 0629 02/06/19 1526   02/03/19 1400  metroNIDAZOLE (FLAGYL) IVPB 500 mg  Status:  Discontinued  500 mg 100 mL/hr over 60 Minutes Intravenous Every 8 hours 02/03/19 0629 02/07/19 0900   02/03/19 0545  metroNIDAZOLE (FLAGYL) IVPB 500 mg     500 mg 100 mL/hr over 60 Minutes Intravenous  Once 02/03/19 0541 02/03/19 0712   02/03/19 0445  cefTRIAXone (ROCEPHIN) 1 g in sodium chloride 0.9 % 100 mL IVPB     1 g 200 mL/hr over 30 Minutes Intravenous  Once 02/03/19  0438 02/03/19 [id number]        Subjective: Patient seen and examined at bedside.  Does not feel well this morning.  Did not sleep well last night and was more short of breath.  Tolerating diet and diarrhea improving.  No overnight fever or vomiting. Objective: Vitals:   02/07/19 2132 02/08/19 0349 02/08/19 0517 02/08/19 0903  BP: 110/66  110/68 115/73  Pulse: (!) 105  100 94  Resp: 16  16 16   Temp: 98.6 F (37 C)  98.7 F (37.1 C)   TempSrc: Oral  Oral   SpO2: (!) 86%  90% 99%  Weight:  50.7 kg    Height:        Intake/Output Summary (Last 24 hours) at 02/08/2019 1130 Last data filed at 02/07/2019 1533 Gross per 24 hour  Intake 0 ml  Output 200 ml  Net -200 ml   Filed Weights   02/06/19 0645 02/07/19 0700 02/08/19 0349  Weight: 48.1 kg 48.1 kg 50.7 kg    Examination:  General exam: No distress.  Looks chronically ill and very thinly built. Respiratory system: Bilateral decreased breath sounds at bases with basilar crackles.   Cardiovascular system: S1-S2 heard, intermittently tachycardic Gastrointestinal system: Abdomen is nondistended, soft and nontender.  Normal bowel sounds heard. Extremities: No cyanosis; trace edema  Data Reviewed: I have personally reviewed following labs and imaging studies  CBC: Recent Labs  Lab 02/04/19 0201 02/05/19 0206 02/06/19 0314 02/07/19 0225 02/08/19 0228  WBC 23.2* 21.7* 21.6* 26.8* 24.0*  NEUTROABS 5.1  --   --   --  3.5  HGB 9.4* 9.1* 9.7* 9.4* 8.8*  HCT 29.4* 27.9* 30.2* 29.5* 26.7*  MCV 100.3* 98.9 100.7* 101.4* 97.8  PLT 116* 121* 139* 138* [id number]*   Basic Metabolic Panel: Recent Labs  Lab 02/04/19 0201 02/05/19 0206 02/06/19 0314 02/07/19 0225 02/08/19 0228  NA 138 142 139 139 140  K 4.9 4.2 3.9 4.2 3.3*  CL 107 112* 108 108 108  CO2 24 20* 20* 20* 23  GLUCOSE 117* 110* 136* 158* 151*  BUN 35* 28* 25* 30* 29*  CREATININE 1.30* 1.17* 1.25* 1.27* 1.32*  CALCIUM 8.1* 8.1* 8.3* 8.2* 8.2*  MG  --   --  1.9 1.8 1.6*    GFR: Estimated Creatinine Clearance: 17.9 mL/min (A) (by C-G formula based on SCr of 1.32 mg/dL (H)). Liver Function Tests: Recent Labs  Lab 02/02/19 1753 02/03/19 0754 02/04/19 0201 02/07/19 0225  AST 18 16 21  39  ALT 12 12 12 15   ALKPHOS 61 48 40 39  BILITOT 0.9 0.7 0.7 0.8  PROT 6.1* 5.5* 5.1* 5.3*  ALBUMIN 3.5 3.0* 2.7* 3.0*   Recent Labs  Lab 02/02/19 1753  LIPASE 34   No results for input(s): AMMONIA in the last 168 hours. Coagulation Profile: Recent Labs  Lab 02/03/19 0429  INR 1.2   Cardiac Enzymes: No results for input(s): CKTOTAL, CKMB, CKMBINDEX, TROPONINI in the last 168 hours. BNP (last 3 results) No results for input(s): PROBNP in the last 8760 hours. HbA1C:  No results for input(s): HGBA1C in the last 72 hours. CBG: No results for input(s): GLUCAP in the last 168 hours. Lipid Profile: No results for input(s): CHOL, HDL, LDLCALC, TRIG, CHOLHDL, LDLDIRECT in the last 72 hours. Thyroid Function Tests: No results for input(s): TSH, T4TOTAL, FREET4, T3FREE, THYROIDAB in the last 72 hours. Anemia Panel: No results for input(s): VITAMINB12, FOLATE, FERRITIN, TIBC, IRON, RETICCTPCT in the last 72 hours. Sepsis Labs: Recent Labs  Lab 02/03/19 0426 02/03/19 0628  LATICACIDVEN 2.7* 1.9    Recent Results (from the past 240 hour(s))  Urine culture     Status: Abnormal   Collection Time: 02/02/19  9:29 PM   Specimen: Urine, Random  Result Value Ref Range Status   Specimen Description URINE, RANDOM  Final   Special Requests   Final    NONE Performed at [facility], [address], [place], [place] [address]    Culture (A)  Final    60,000 COLONIES/mL METHICILLIN RESISTANT STAPHYLOCOCCUS AUREUS   Report Status 02/05/2019 FINAL  Final   Organism ID, Bacteria METHICILLIN RESISTANT STAPHYLOCOCCUS AUREUS (A)  Final      Susceptibility   Methicillin resistant staphylococcus aureus - MIC*    CIPROFLOXACIN >=8 RESISTANT Resistant     GENTAMICIN  <=0.5 SENSITIVE Sensitive     NITROFURANTOIN 32 SENSITIVE Sensitive     OXACILLIN >=4 RESISTANT Resistant     TETRACYCLINE <=1 SENSITIVE Sensitive     VANCOMYCIN 1 SENSITIVE Sensitive     TRIMETH/SULFA <=10 SENSITIVE Sensitive     CLINDAMYCIN <=0.25 SENSITIVE Sensitive     RIFAMPIN <=0.5 SENSITIVE Sensitive     Inducible Clindamycin NEGATIVE Sensitive     * 60,000 COLONIES/mL METHICILLIN RESISTANT STAPHYLOCOCCUS AUREUS  Blood culture (routine x 2)     Status: Abnormal   Collection Time: 02/03/19  4:25 AM   Specimen: BLOOD LEFT FOREARM  Result Value Ref Range Status   Specimen Description BLOOD LEFT FOREARM  Final   Special Requests   Final    BOTTLES DRAWN AEROBIC AND ANAEROBIC Blood Culture adequate volume   Culture  Setup Time   Final    GRAM POSITIVE RODS AEROBIC BOTTLE ONLY CRITICAL RESULT CALLED TO, READ BACK BY AND VERIFIED WITH: PHARMD JEREMY FRENS  [id number] FCP     Culture (A)  Final    DIPHTHEROIDS(CORYNEBACTERIUM SPECIES) Standardized susceptibility testing for this organism is not available. Performed at [facility], [place] [address] [place], [place] [address]    Report Status 02/06/2019 FINAL  Final  SARS CORONAVIRUS 2 (TAT 6-24 HRS) Nasopharyngeal Nasopharyngeal Swab     Status: None   Collection Time: 02/03/19  4:35 AM   Specimen: Nasopharyngeal Swab  Result Value Ref Range Status   SARS Coronavirus 2 NEGATIVE NEGATIVE Final    Comment: (NOTE) SARS-CoV-2 target nucleic acids are NOT DETECTED. The SARS-CoV-2 RNA is generally detectable in upper and lower respiratory specimens during the acute phase of infection. Negative results do not preclude SARS-CoV-2 infection, do not rule out co-infections with other pathogens, and should not be used as the sole basis for treatment or other patient management decisions. Negative results must be combined with clinical observations, patient history, and epidemiological information. The expected result is Negative.  Fact Sheet for Patients: SugarRoll.be Fact Sheet for Healthcare Providers: https://www.woods-mathews.com/ This test is not yet approved or cleared by the [place] FDA and  has been authorized for detection and/or diagnosis of SARS-CoV-2 by FDA under an Emergency Use  Authorization (EUA). This EUA will remain  in effect (meaning this test can be used) for the duration of the COVID-19 declaration under Section 56 4(b)(1) of the Act, 21 U.S.C. section 360bbb-3(b)(1), unless the authorization is terminated or revoked sooner. Performed at [facility], [place] [address], [place], [place] [address]   Gastrointestinal Panel by PCR , Stool     Status: None   Collection Time: 02/03/19  5:00 AM   Specimen: Stool  Result Value Ref Range Status   Campylobacter species NOT DETECTED NOT DETECTED Final   Plesimonas shigelloides NOT DETECTED NOT DETECTED Final   Salmonella species NOT DETECTED NOT DETECTED Final   Yersinia enterocolitica NOT DETECTED NOT DETECTED Final   Vibrio species NOT DETECTED NOT DETECTED Final   Vibrio cholerae NOT DETECTED NOT DETECTED Final   Enteroaggregative E coli (EAEC) NOT DETECTED NOT DETECTED Final   Enteropathogenic E coli (EPEC) NOT DETECTED NOT DETECTED Final   Enterotoxigenic E coli (ETEC) NOT DETECTED NOT DETECTED Final   Shiga like toxin producing E coli (STEC) NOT DETECTED NOT DETECTED Final   Shigella/Enteroinvasive E coli (EIEC) NOT DETECTED NOT DETECTED Final   Cryptosporidium NOT DETECTED NOT DETECTED Final   Cyclospora cayetanensis NOT DETECTED NOT DETECTED Final   Entamoeba histolytica NOT DETECTED NOT DETECTED Final   Giardia lamblia NOT DETECTED NOT DETECTED Final   Adenovirus F40/41 NOT DETECTED NOT DETECTED Final   Astrovirus NOT DETECTED NOT DETECTED Final   Norovirus GI/GII NOT DETECTED NOT DETECTED Final   Rotavirus A NOT DETECTED NOT DETECTED Final   Sapovirus (I, II, IV, and V) NOT DETECTED  NOT DETECTED Final    Comment: Performed at [facility], [address] [place], [place] [address]  C Difficile Quick Screen w PCR reflex     Status: None   Collection Time: 02/03/19  5:00 AM  Result Value Ref Range Status   C Diff antigen NEGATIVE NEGATIVE Final   C Diff toxin NEGATIVE NEGATIVE Final   C Diff interpretation No C. difficile detected.  Final    Comment: Performed at [facility], [place] [address], [place], [place] [address]  Blood culture (routine x 2)     Status: None   Collection Time: 02/03/19  5:15 AM   Specimen: BLOOD RIGHT FOREARM  Result Value Ref Range Status   Specimen Description BLOOD RIGHT FOREARM  Final   Special Requests   Final    BOTTLES DRAWN AEROBIC AND ANAEROBIC Blood Culture adequate volume   Culture   Final    NO GROWTH 5 DAYS Performed at [facility], [place] [address] [place], [place] [address]    Report Status 02/08/2019 FINAL  Final  Surgical pcr screen     Status: Abnormal   Collection Time: 02/07/19  8:40 AM   Specimen: Nasal Mucosa; Nasal Swab  Result Value Ref Range Status   MRSA, PCR POSITIVE (A) NEGATIVE Final    Comment: RESULT CALLED TO, READ BACK BY AND VERIFIED WITH: Arther Dames RN 10:50 02/07/19 ([email or web address])    Staphylococcus aureus POSITIVE (A) NEGATIVE Final    Comment: (NOTE) The Xpert SA Assay (FDA approved for NASAL specimens in patients 22 years of age and older), is one component of a comprehensive surveillance program. It is not intended to diagnose infection nor to guide or monitor treatment. Performed at [facility], [place] [address], [place], [place] [address]          Radiology Studies: Dg  Chest Port 1 View  Result Date: 02/08/2019 CLINICAL DATA:  Shortness of breath EXAM: PORTABLE CHEST 1 VIEW COMPARISON:  02/06/2019 FINDINGS: Pulmonary vascular congestion. Left lower lobe and right lower lobe atelectasis. Small right pleural effusion. Cardiomegaly.  Thoracic aortic atherosclerosis.  Large hiatal hernia at the left lung base. IMPRESSION: Pulmonary vascular congestion.  Small right pleural effusion. Bilateral lower lobe atelectasis. Large hiatal hernia. Electronically Signed   By: Julian Hy M.D.   On: 02/08/2019 10:44   Dg Abd Acute 2+v W 1v Chest  Result Date: 02/07/2019 CLINICAL DATA:  Generalized abdominal pain and vomiting for 2 days. EXAM: DG ABDOMEN ACUTE W/ 1V CHEST COMPARISON:  September 04, 2015. FINDINGS: There is no evidence of dilated bowel loops or free intraperitoneal air. No radiopaque calculi or other significant radiographic abnormality is seen. Cardiomegaly is noted. Elevated left hemidiaphragm is noted. Mild bibasilar subsegmental atelectasis is noted. Central pulmonary vascular congestion is noted with possible bilateral pulmonary edema. Status post cholecystectomy. IMPRESSION: No evidence of bowel obstruction or ileus. Cardiomegaly is noted with central pulmonary vascular congestion and probable bilateral pulmonary edema. Elevated left hemidiaphragm is noted with bibasilar subsegmental atelectasis. Aortic Atherosclerosis (ICD10-I70.0). Electronically Signed   By: Marijo Conception M.D.   On: 02/07/2019 09:49   US Abdomen Limited Ruq  Result Date: 02/07/2019 CLINICAL DATA:  Abdominal pain EXAM: ULTRASOUND ABDOMEN LIMITED RIGHT UPPER QUADRANT COMPARISON:  None. FINDINGS: Gallbladder: Postcholecystectomy Common bile duct: Diameter: Normal at 5 mm Liver: No focal lesion identified. Within normal limits in parenchymal echogenicity. Portal vein is patent on color Doppler imaging with normal direction of blood flow towards the liver. Other: None. IMPRESSION: Normal RIGHT upper quadrant ultrasound following cholecystectomy. Electronically Signed   By: Suzy Bouchard M.D.   On: 02/07/2019 15:38        Scheduled Meds: . carvedilol  6.25 mg Oral BID  . cefdinir  300 mg Oral Daily  . Chlorhexidine Gluconate Cloth  6 each Topical Q0600  . metroNIDAZOLE  500 mg Oral  Q8H  . mupirocin ointment  1 application Nasal BID  . sodium chloride flush  3 mL Intravenous Q12H  . sodium chloride flush  3 mL Intravenous Q12H   Continuous Infusions: . sodium chloride 5 mL/hr at 02/07/19 0000          Aline August, MD [facility] 02/08/2019, 11:30 AM

## 2019-02-09 DIAGNOSIS — R531 Weakness: Secondary | ICD-10-CM

## 2019-02-09 DIAGNOSIS — Z66 Do not resuscitate: Secondary | ICD-10-CM

## 2019-02-09 DIAGNOSIS — K5733 Diverticulitis of large intestine without perforation or abscess with bleeding: Secondary | ICD-10-CM

## 2019-02-09 DIAGNOSIS — D649 Anemia, unspecified: Secondary | ICD-10-CM

## 2019-02-09 DIAGNOSIS — A419 Sepsis, unspecified organism: Principal | ICD-10-CM

## 2019-02-09 DIAGNOSIS — Z515 Encounter for palliative care: Secondary | ICD-10-CM

## 2019-02-09 DIAGNOSIS — N1831 Chronic kidney disease, stage 3a: Secondary | ICD-10-CM

## 2019-02-09 LAB — CBC WITH DIFFERENTIAL/PLATELET
Abs Immature Granulocytes: 0.04 10*3/uL (ref 0.00–0.07)
Basophils Absolute: 0 10*3/uL (ref 0.0–0.1)
Basophils Relative: 0 %
Eosinophils Absolute: 0.1 10*3/uL (ref 0.0–0.5)
Eosinophils Relative: 0 %
HCT: 28 % — ABNORMAL LOW (ref 36.0–46.0)
Hemoglobin: 9.1 g/dL — ABNORMAL LOW (ref 12.0–15.0)
Immature Granulocytes: 0 %
Lymphocytes Relative: 84 %
Lymphs Abs: 20.6 10*3/uL — ABNORMAL HIGH (ref 0.7–4.0)
MCH: 32.3 pg (ref 26.0–34.0)
MCHC: 32.5 g/dL (ref 30.0–36.0)
MCV: 99.3 fL (ref 80.0–100.0)
Monocytes Absolute: 0.7 10*3/uL (ref 0.1–1.0)
Monocytes Relative: 3 %
Neutro Abs: 3.3 10*3/uL (ref 1.7–7.7)
Neutrophils Relative %: 13 %
Platelets: 136 10*3/uL — ABNORMAL LOW (ref 150–400)
RBC: 2.82 MIL/uL — ABNORMAL LOW (ref 3.87–5.11)
RDW: 12.5 % (ref 11.5–15.5)
WBC: 24.8 10*3/uL — ABNORMAL HIGH (ref 4.0–10.5)
nRBC: 0.2 % (ref 0.0–0.2)

## 2019-02-09 LAB — BASIC METABOLIC PANEL
Anion gap: 7 (ref 5–15)
BUN: 31 mg/dL — ABNORMAL HIGH (ref 8–23)
CO2: 24 mmol/L (ref 22–32)
Calcium: 8.4 mg/dL — ABNORMAL LOW (ref 8.9–10.3)
Chloride: 107 mmol/L (ref 98–111)
Creatinine, Ser: 1.35 mg/dL — ABNORMAL HIGH (ref 0.44–1.00)
GFR calc Af Amer: 39 mL/min — ABNORMAL LOW (ref >60)
GFR calc non Af Amer: 34 mL/min — ABNORMAL LOW (ref >60)
Glucose, Bld: 163 mg/dL — ABNORMAL HIGH (ref 70–99)
Potassium: 3.9 mmol/L (ref 3.5–5.1)
Sodium: 138 mmol/L (ref 135–145)

## 2019-02-09 LAB — MAGNESIUM: Magnesium: 2 mg/dL (ref 1.7–2.4)

## 2019-02-09 MED ORDER — CEFDINIR 250 MG/5ML PO SUSR: 300.0000 mg | Freq: Every day | ORAL | 0 refills | Status: AC

## 2019-02-09 MED ORDER — METRONIDAZOLE 50 MG/ML ORAL SUSPENSION: 500.0000 mg | Freq: Three times a day (TID) | ORAL | 0 refills | Status: AC

## 2019-02-09 MED ORDER — PHENOL 1.4 % MT LIQD: 1.0000 | OROMUCOSAL | Status: DC | PRN

## 2019-02-09 NOTE — Progress Notes (Signed)
Physical Therapy Treatment Patient Details Name: Mary Cline MRN: [id number] DOB: [birth year] Today's Date: 02/09/2019    History of Present Illness 85 y.o. female with medical history significant for chronic systolic CHF with EF 20 to 25%, dementia, hypertension, and chronic kidney disease stage III. She presented to the emergency department with 1 day of lower abdominal pain, nausea, and bloody diarrhea. Pt admitted for diverticulitis.    PT Comments    Pt continues to progress with functional mobility and gait training. Pt performed bed mobility with min assist for trunk elevation and LE management. Pt ambulated 55 ft this session with RW and min assist, cues for upright posture and increased step length. Pt educated on HEP including [place] and seated marches. Pt will continue to benefit from acute PT services in order to address impairments in mobility and endurance, and maximize functional independence with mobility.     Follow Up Recommendations  No PT follow up;Supervision/Assistance - 24 hour     Equipment Recommendations  None recommended by PT    Recommendations for Other Services       Precautions / Restrictions Precautions Precautions: Fall Restrictions Weight Bearing Restrictions: No    Mobility  Bed Mobility Overal bed mobility: Needs Assistance Bed Mobility: Supine to Sit;Sit to Supine     Supine to sit: Min assist Sit to supine: Min assist   General bed mobility comments: Pt requires min assist for trunk elevation with supine>sit and then min assist for LE management with sit>supine  Transfers Overall transfer level: Needs assistance Equipment used: Rolling walker (2 wheeled) Transfers: Sit to/from Stand Sit to Stand: Min assist         General transfer comment: min assistance to boost into standing.  Ambulation/Gait Ambulation/Gait assistance: Min assist Gait Distance (Feet): 55 Feet Assistive device: Rolling walker (2 wheeled) Gait  Pattern/deviations: Step-through pattern;Decreased stride length;Trunk flexed Gait velocity: decreased   General Gait Details: Pt flexed forward with  poor RW management, cues for upright posture and device management   Stairs             Wheelchair Mobility    Modified Rankin (Stroke Patients Only)       Balance Overall balance assessment: Needs assistance Sitting-balance support: No upper extremity supported;Feet supported Sitting balance-Leahy Scale: Fair Sitting balance - Comments: posterior lean in sitting, cues to correct Postural control: Posterior lean Standing balance support: Bilateral upper extremity supported;During functional activity Standing balance-Leahy Scale: Poor Standing balance comment: reliant on RW for UE support with standing balance                            Cognition Arousal/Alertness: Awake/alert Behavior During Therapy: WFL for tasks assessed/performed Overall Cognitive Status: History of cognitive impairments - at baseline                                 General Comments: h/o dementia      Exercises General Exercises - Lower Extremity Long Arc Quad: AROM;Both;15 reps Hip Flexion/Marching: AROM;Both;15 reps    General Comments General comments (skin integrity, edema, etc.): Pt on 2L O2 at rest in bed, ambulated on RA with SpO2 93%      Pertinent Vitals/Pain Pain Assessment: No/denies pain    Home Living                      Prior Function  PT Goals (current goals can now be found in the care plan section) Progress towards PT goals: Progressing toward goals    Frequency    Min 3X/week      PT Plan Current plan remains appropriate    Co-evaluation              AM-PAC PT "6 Clicks" Mobility   Outcome Measure  Help needed turning from your back to your side while in a flat bed without using bedrails?: A Little Help needed moving from lying on your back to sitting on  the side of a flat bed without using bedrails?: A Little Help needed moving to and from a bed to a chair (including a wheelchair)?: A Little Help needed standing up from a chair using your arms (e.g., wheelchair or bedside chair)?: A Little Help needed to walk in hospital room?: A Little Help needed climbing 3-5 steps with a railing? : A Lot 6 Click Score: 17    End of Session Equipment Utilized During Treatment: Gait belt Activity Tolerance: Patient tolerated treatment well Patient left: in bed;with call bell/phone within reach;with bed alarm set Nurse Communication: Mobility status PT Visit Diagnosis: Muscle weakness (generalized) (M62.81)     Time: [id number] PT Time Calculation (min) (ACUTE ONLY): 15 min  Charges:  $Gait Training: 8-22 mins                     Netta Corrigan, PT, DPT, CSRS Acute Rehab Office [place] 02/09/2019, 10:56 AM

## 2019-02-09 NOTE — Discharge Summary (Signed)
Physician Discharge Summary  Mary Cline [id number] DOB: 07/02/1922 DOA: 02/02/2019  PCP: [name], MD  Admit date: 02/02/2019 Discharge date: 02/09/2019  Time spent: 45 minutes  Recommendations for Outpatient Follow-up:  Patient will be discharged to home with hospice.  Patient will need to follow up with primary care provider within one week of discharge.  Patient should continue medications as prescribed.  Patient should follow a heart healthy diet.   Discharge Diagnoses:  Acute on chronic systolic heart failure Probable acute sigmoid diverticulitis presenting with hematochezia and abdominal pain Sepsis Anemia of chronic disease Thrombocytopenia Chronic kidney disease, stage IIIa Essential hypertension Questionable episode of apnea in the emergency department Generalized deconditioning CLL  Discharge Condition: stable  Diet recommendation: heart healthy   Filed Weights   02/07/19 0700 02/08/19 0349 02/09/19 0456  Weight: 48.1 kg 50.7 kg 55.1 kg    History of present illness:  On 02/03/2019 by Dr. Christia Reading Opyd Mary Cline is a 85 y.o. female with medical history significant for chronic systolic CHF with EF 20 to 25%, dementia, hypertension, and chronic kidney disease stage III, now presenting to the emergency department with 1 day of lower abdominal pain, nausea, and bloody diarrhea.  Patient is accompanied by her daughter who assists with the history.  She reportedly woke in her usual state of health but shortly after breakfast developed severe nausea.  She went on to have diarrhea and was complaining of pain across her lower abdomen.  The third episode of diarrhea contained bright red blood per the daughter's report and the fourth episode was mainly bright red blood.  Patient has not been coughing, has not appeared dyspneic, and has not been complaining of any chest pain.  Hospital Course:  Acute on chronic systolic heart failure -Echocardiogram  March 2020 showed an EF of 2025% -Continue Coreg -Patient did receive Lasix as she complained of shortness of breath and did not feel well -Chest x-ray showed some congestion -Echocardiogram 02/08/2019 showed an EF of 20%.  LV has severely decreased function.  LV demonstrates global hypokinesis, septal lateral dyssynchrony consistent with bundle branch block.  Probable acute sigmoid diverticulitis presenting with hematochezia and abdominal pain -Patient treated with Rocephin and Flagyl, switch to oral cefdinir and Flagyl by gastroenterology 02/06/2019 -GI signed off recommending a total of 10 days of antibiotics -Hemoglobin currently 9.1 -RUQ [place]: Negative for CBD stones  Sepsis -Present on admission -Patient currently stable -Blood cultures: 1 aerobic bottle showed VG PR, diphtheria voids.  Suspect this is contamination.  Otherwise blood culture showed no growth to date -Urine culture 60,000 colonies MRSA -Continue antibiotics as above  Anemia of chronic disease -Baseline hemoglobin around 10 -Currently hemoglobin 9.1  Thrombocytopenia -Unclear etiology, however platelets 136 and stable  Chronic kidney disease, stage IIIa -baseline creatinine 1-1.2, currently 1.35  Essential hypertension -Continue Coreg  Questionable episode of apnea in the emergency department -Required supplemental oxygen initially but now on room air -Per previous documentation, patient had episodes at home apparently it was   Generalized deconditioning -Given her advanced age and systolic heart failure, prognosis is guarded to poor. -Status DNR -Palliative care consulted and appreciated, patient will go home today with hospice  CLL -patient was following with oncology, Dr. Irene Limbo- last visit 04/12/2018 -possible rise in WBC secondary to this?   Procedures: Echocardiogram  Consultations: Gastroenterology Palliative care  CODE STATUS: DNR  Discharge Exam: Vitals:   02/08/19 2241 02/09/19 0527   BP: 105/66 114/72  Pulse: 88 91  Resp: 16 16  Temp: 98.6 F (37 C) 98.9 F (37.2 C)  SpO2: 98% 97%     General: Well developed, thin, chronically ill-appearing, NAD  HEENT: NCAT, mucous membranes moist.  Cardiovascular: S1 S2 auscultated, RRR  Respiratory: Clear to auscultation bilaterally  Abdomen: Soft, nontender, nondistended, + bowel sounds  Extremities: warm dry without cyanosis clubbing or edema  Neuro: AAOx3, nonfocal  Psych: Appropriate  Discharge Instructions  Allergies as of 02/09/2019      Reactions   Fish Allergy Anaphylaxis   Iodine Anaphylaxis   Actonel [risedronate Sodium] Nausea And Vomiting   Atorvastatin Other (See Comments)   Blisters in mouth.   Blisters in mouth.     Ciprofloxacin Nausea And Vomiting   Codeine Other (See Comments)   Headache Headache   Corticotropin Other (See Comments)   Confusion Confusion   Demeclocycline    Stomach pain   Dihydrotachysterol    Dizzy confused   Garamycin [gentamicin Sulfate] Rash   Lactose Intolerance (gi) Other (See Comments)   Intolerance.     Lactulose    Intolerance.     Lovastatin Other (See Comments)   blisters blisters   Macrobid [nitrofurantoin Monohyd Macro] Nausea And Vomiting   Meperidine Other (See Comments)   Hypotension Hypotension   Other Hives, Swelling   Melons and strawberries.     Paxil [paroxetine Hcl] Nausea Only   Penicillins Other (See Comments)   Did it involve swelling of the face/tongue/throat, SOB, or low BP? No Did it involve sudden or severe rash/hives, skin peeling, or any reaction on the inside of your mouth or nose? Unknown Did you need to seek medical attention at a hospital or doctor's office? Unknown When did it last happen?Childhood If all above answers are "NO", may proceed with cephalosporin use. Pain.     Rofecoxib Other (See Comments)   Not known Not known   Sulfa Antibiotics Swelling   Tetracyclines & Related Other (See Comments)   Stomach  pain   Vitamin D Analogs Other (See Comments)   Dizzy confused      Medication List    STOP taking these medications   irbesartan 150 MG tablet Commonly known as: AVAPRO   mirtazapine 15 MG tablet Commonly known as: REMERON   spironolactone 25 MG tablet Commonly known as: ALDACTONE     TAKE these medications   aspirin 81 MG tablet Take 81 mg by mouth daily.   carvedilol 6.25 MG tablet Commonly known as: COREG TAKE 1 TABLET TWICE DAILY   cefdinir 250 MG/5ML suspension Commonly known as: OMNICEF Take 6 mLs (300 mg total) by mouth daily for 4 days.   cholecalciferol 25 MCG (1000 UT) tablet Commonly known as: VITAMIN D3 Take 1,000 Units by mouth daily.   ezetimibe 10 MG tablet Commonly known as: ZETIA Take 10 mg by mouth daily.   furosemide 20 MG tablet Commonly known as: LASIX Take 1 tablet (20 mg total) by mouth as needed. For weight gain more that 2 pounds in a day or 5 pounds in a week.   glycerin adult 2 g Supp Place 1 suppository rectally once as needed for moderate constipation.   metroNIDAZOLE 50 mg/ml oral suspension Commonly known as: FLAGYL Take 10 mLs (500 mg total) by mouth every 8 (eight) hours for 4 days.   multivitamin tablet Take 1 tablet by mouth daily.   omeprazole 40 MG capsule Commonly known as: PRILOSEC Take 40 mg by mouth daily.   ondansetron 4 MG tablet Commonly  known as: ZOFRAN Take 1 tablet (4 mg total) by mouth every 8 (eight) hours as needed for nausea or vomiting.   traMADol 50 MG tablet Commonly known as: ULTRAM Take 1 tablet (50 mg total) by mouth 3 (three) times daily as needed for severe pain.   Vitamin B 12 500 MCG Tabs Take 500 mcg by mouth every Monday, Wednesday, and Friday.      Allergies  Allergen Reactions  . Fish Allergy Anaphylaxis  . Iodine Anaphylaxis  . Actonel [Risedronate Sodium] Nausea And Vomiting  . Atorvastatin Other (See Comments)    Blisters in mouth.   Blisters in mouth.    . Ciprofloxacin  Nausea And Vomiting  . Codeine Other (See Comments)    Headache  Headache  . Corticotropin Other (See Comments)    Confusion  Confusion  . Demeclocycline     Stomach pain  . Dihydrotachysterol     Dizzy confused  . Garamycin [Gentamicin Sulfate] Rash  . Lactose Intolerance (Gi) Other (See Comments)    Intolerance.    . Lactulose     Intolerance.    . Lovastatin Other (See Comments)    blisters blisters  . Macrobid [Nitrofurantoin Monohyd Macro] Nausea And Vomiting  . Meperidine Other (See Comments)    Hypotension  Hypotension  . Other Hives and Swelling    Melons and strawberries.    . Paxil [Paroxetine Hcl] Nausea Only  . Penicillins Other (See Comments)    Did it involve swelling of the face/tongue/throat, SOB, or low BP? No Did it involve sudden or severe rash/hives, skin peeling, or any reaction on the inside of your mouth or nose? Unknown Did you need to seek medical attention at a hospital or doctor's office? Unknown When did it last happen?Childhood If all above answers are "NO", may proceed with cephalosporin use. Pain.    . Rofecoxib Other (See Comments)    Not known Not known  . Sulfa Antibiotics Swelling  . Tetracyclines & Related Other (See Comments)    Stomach pain  . Vitamin D Analogs Other (See Comments)    Dizzy confused   Follow-up Information    Rankins, Bill Salinas, MD Follow up.   Specialty: Family Medicine Why: as needed Contact information: [place] [address] [phone]        Skeet Latch, MD .   Specialty: Cardiology Contact information: [address] [place] [address] [phone]            The results of significant diagnostics from this hospitalization (including imaging, microbiology, ancillary and laboratory) are listed below for reference.    Significant Diagnostic Studies: Ct Abdomen Pelvis Wo Contrast  Result Date: 02/03/2019 CLINICAL DATA:  Diffuse abdominal  pain. Vomiting diarrhea with hematochezia. EXAM: CT ABDOMEN AND PELVIS WITHOUT CONTRAST TECHNIQUE: Multidetector CT imaging of the abdomen and pelvis was performed following the standard protocol without IV contrast. COMPARISON:  June 10, 2018 FINDINGS: Lower chest: The lung bases are clear. The heart is enlarged. Hepatobiliary: The liver is normal. Status post cholecystectomy.There is no biliary ductal dilation. Pancreas: Normal contours without ductal dilatation. No peripancreatic fluid collection. Spleen: No splenic laceration or hematoma. Adrenals/Urinary Tract: --Adrenal glands: No adrenal hemorrhage. --Right kidney/ureter: No hydronephrosis or perinephric hematoma. --Left kidney/ureter: No hydronephrosis or perinephric hematoma. --Urinary bladder: Unremarkable. Stomach/Bowel: --Stomach/Duodenum: No hiatal hernia or other gastric abnormality. Normal duodenal course and caliber. --Small bowel: No dilatation or inflammation. --Colon: There is some wall thickening of the sigmoid colon. --  Appendix: Normal. Vascular/Lymphatic: Atherosclerotic calcification is present within the non-aneurysmal abdominal aorta, without hemodynamically significant stenosis. --No retroperitoneal lymphadenopathy. --No mesenteric lymphadenopathy. --No pelvic or inguinal lymphadenopathy. Reproductive: Status post hysterectomy. No adnexal mass. Other: No ascites or free air. The abdominal wall is normal. Musculoskeletal. No acute displaced fractures. IMPRESSION: There is some wall thickening of the sigmoid colon, which could indicate diverticulitis or infectious/inflammatory colitis. Aortic Atherosclerosis (ICD10-I70.0). Electronically Signed   By: Constance Holster M.D.   On: 02/03/2019 05:16   Dg Chest Port 1 View  Result Date: 02/08/2019 CLINICAL DATA:  Shortness of breath EXAM: PORTABLE CHEST 1 VIEW COMPARISON:  02/06/2019 FINDINGS: Pulmonary vascular congestion. Left lower lobe and right lower lobe atelectasis. Small right  pleural effusion. Cardiomegaly.  Thoracic aortic atherosclerosis. Large hiatal hernia at the left lung base. IMPRESSION: Pulmonary vascular congestion.  Small right pleural effusion. Bilateral lower lobe atelectasis. Large hiatal hernia. Electronically Signed   By: Julian Hy M.D.   On: 02/08/2019 10:44   Dg Chest Port 1 View  Result Date: 02/06/2019 CLINICAL DATA:  Shortness of breath. EXAM: PORTABLE CHEST 1 VIEW COMPARISON:  Chest radiograph 02/03/2019. FINDINGS: Stable cardiomegaly. Persistent elevation left hemidiaphragm. Interval increased bilateral interstitial pulmonary opacities. Persistent heterogeneous opacities left lung base. Possible trace bilateral pleural effusions. Cardiomegaly with increasing IMPRESSION: Interstitial opacities may represent edema. Persistent marked elevation left hemidiaphragm. Left basilar atelectasis. Electronically Signed   By: Lovey Newcomer M.D.   On: 02/06/2019 10:02   Dg Chest Port 1 View  Result Date: 02/03/2019 CLINICAL DATA:  Mechanical fall EXAM: PORTABLE CHEST 1 VIEW COMPARISON:  06/10/2018 FINDINGS: Chronic elevation of the left diaphragm with air distended bowel. Atelectasis at the left base. Right lung is clear. Enlarged cardiomediastinal silhouette with aortic atherosclerosis. No pneumothorax. IMPRESSION: Chronic elevation left diaphragm with basilar atelectasis. Cardiomegaly. Electronically Signed   By: Donavan Foil M.D.   On: 02/03/2019 16:36   Dg Abd Acute 2+v W 1v Chest  Result Date: 02/07/2019 CLINICAL DATA:  Generalized abdominal pain and vomiting for 2 days. EXAM: DG ABDOMEN ACUTE W/ 1V CHEST COMPARISON:  September 04, 2015. FINDINGS: There is no evidence of dilated bowel loops or free intraperitoneal air. No radiopaque calculi or other significant radiographic abnormality is seen. Cardiomegaly is noted. Elevated left hemidiaphragm is noted. Mild bibasilar subsegmental atelectasis is noted. Central pulmonary vascular congestion is noted with  possible bilateral pulmonary edema. Status post cholecystectomy. IMPRESSION: No evidence of bowel obstruction or ileus. Cardiomegaly is noted with central pulmonary vascular congestion and probable bilateral pulmonary edema. Elevated left hemidiaphragm is noted with bibasilar subsegmental atelectasis. Aortic Atherosclerosis (ICD10-I70.0). Electronically Signed   By: Marijo Conception M.D.   On: 02/07/2019 09:49   US Abdomen Limited Ruq  Result Date: 02/07/2019 CLINICAL DATA:  Abdominal pain EXAM: ULTRASOUND ABDOMEN LIMITED RIGHT UPPER QUADRANT COMPARISON:  None. FINDINGS: Gallbladder: Postcholecystectomy Common bile duct: Diameter: Normal at 5 mm Liver: No focal lesion identified. Within normal limits in parenchymal echogenicity. Portal vein is patent on color Doppler imaging with normal direction of blood flow towards the liver. Other: None. IMPRESSION: Normal RIGHT upper quadrant ultrasound following cholecystectomy. Electronically Signed   By: Suzy Bouchard M.D.   On: 02/07/2019 15:38    Microbiology: Recent Results (from the past 240 hour(s))  Urine culture     Status: Abnormal   Collection Time: 02/02/19  9:29 PM   Specimen: Urine, Random  Result Value Ref Range Status   Specimen Description URINE, RANDOM  Final   Special  Requests   Final    NONE Performed at [facility], [place] [address], [place], [place] [address]    Culture (A)  Final    60,000 COLONIES/mL METHICILLIN RESISTANT STAPHYLOCOCCUS AUREUS   Report Status 02/05/2019 FINAL  Final   Organism ID, Bacteria METHICILLIN RESISTANT STAPHYLOCOCCUS AUREUS (A)  Final      Susceptibility   Methicillin resistant staphylococcus aureus - MIC*    CIPROFLOXACIN >=8 RESISTANT Resistant     GENTAMICIN <=0.5 SENSITIVE Sensitive     NITROFURANTOIN 32 SENSITIVE Sensitive     OXACILLIN >=4 RESISTANT Resistant     TETRACYCLINE <=1 SENSITIVE Sensitive     VANCOMYCIN 1 SENSITIVE Sensitive     TRIMETH/SULFA <=10 SENSITIVE Sensitive      CLINDAMYCIN <=0.25 SENSITIVE Sensitive     RIFAMPIN <=0.5 SENSITIVE Sensitive     Inducible Clindamycin NEGATIVE Sensitive     * 60,000 COLONIES/mL METHICILLIN RESISTANT STAPHYLOCOCCUS AUREUS  Blood culture (routine x 2)     Status: Abnormal   Collection Time: 02/03/19  4:25 AM   Specimen: BLOOD LEFT FOREARM  Result Value Ref Range Status   Specimen Description BLOOD LEFT FOREARM  Final   Special Requests   Final    BOTTLES DRAWN AEROBIC AND ANAEROBIC Blood Culture adequate volume   Culture  Setup Time   Final    GRAM POSITIVE RODS AEROBIC BOTTLE ONLY CRITICAL RESULT CALLED TO, READ BACK BY AND VERIFIED WITH: PHARMD JEREMY FRENS  [id number] FCP     Culture (A)  Final    DIPHTHEROIDS(CORYNEBACTERIUM SPECIES) Standardized susceptibility testing for this organism is not available. Performed at [facility], [place] [address], [place], [place] [address]    Report Status 02/06/2019 FINAL  Final  SARS CORONAVIRUS 2 (TAT 6-24 HRS) Nasopharyngeal Nasopharyngeal Swab     Status: None   Collection Time: 02/03/19  4:35 AM   Specimen: Nasopharyngeal Swab  Result Value Ref Range Status   SARS Coronavirus 2 NEGATIVE NEGATIVE Final    Comment: (NOTE) SARS-CoV-2 target nucleic acids are NOT DETECTED. The SARS-CoV-2 RNA is generally detectable in upper and lower respiratory specimens during the acute phase of infection. Negative results do not preclude SARS-CoV-2 infection, do not rule out co-infections with other pathogens, and should not be used as the sole basis for treatment or other patient management decisions. Negative results must be combined with clinical observations, patient history, and epidemiological information. The expected result is Negative. Fact Sheet for Patients: SugarRoll.be Fact Sheet for Healthcare Providers: https://www.woods-mathews.com/ This test is not yet approved or cleared by the [place] FDA and  has been  authorized for detection and/or diagnosis of SARS-CoV-2 by FDA under an Emergency Use Authorization (EUA). This EUA will remain  in effect (meaning this test can be used) for the duration of the COVID-19 declaration under Section 56 4(b)(1) of the Act, 21 U.S.C. section 360bbb-3(b)(1), unless the authorization is terminated or revoked sooner. Performed at [facility], [place] [address] [place],  [address]   Gastrointestinal Panel by PCR , Stool     Status: None   Collection Time: 02/03/19  5:00 AM   Specimen: Stool  Result Value Ref Range Status   Campylobacter species NOT DETECTED NOT DETECTED Final   Plesimonas shigelloides NOT DETECTED NOT DETECTED Final   Salmonella species NOT DETECTED NOT DETECTED Final   Yersinia enterocolitica NOT DETECTED NOT DETECTED Final   Vibrio species NOT DETECTED NOT DETECTED Final   Vibrio cholerae  NOT DETECTED NOT DETECTED Final   Enteroaggregative E coli (EAEC) NOT DETECTED NOT DETECTED Final   Enteropathogenic E coli (EPEC) NOT DETECTED NOT DETECTED Final   Enterotoxigenic E coli (ETEC) NOT DETECTED NOT DETECTED Final   Shiga like toxin producing E coli (STEC) NOT DETECTED NOT DETECTED Final   Shigella/Enteroinvasive E coli (EIEC) NOT DETECTED NOT DETECTED Final   Cryptosporidium NOT DETECTED NOT DETECTED Final   Cyclospora cayetanensis NOT DETECTED NOT DETECTED Final   Entamoeba histolytica NOT DETECTED NOT DETECTED Final   Giardia lamblia NOT DETECTED NOT DETECTED Final   Adenovirus F40/41 NOT DETECTED NOT DETECTED Final   Astrovirus NOT DETECTED NOT DETECTED Final   Norovirus GI/GII NOT DETECTED NOT DETECTED Final   Rotavirus A NOT DETECTED NOT DETECTED Final   Sapovirus (I, II, IV, and V) NOT DETECTED NOT DETECTED Final    Comment: Performed at [facility], [place] [place], [place] [address]  C Difficile Quick Screen w PCR reflex     Status: None   Collection Time: 02/03/19  5:00 AM  Result Value Ref Range  Status   C Diff antigen NEGATIVE NEGATIVE Final   C Diff toxin NEGATIVE NEGATIVE Final   C Diff interpretation No C. difficile detected.  Final    Comment: Performed at [facility], [place] [address] [place], [place] [address]  Blood culture (routine x 2)     Status: None   Collection Time: 02/03/19  5:15 AM   Specimen: BLOOD RIGHT FOREARM  Result Value Ref Range Status   Specimen Description BLOOD RIGHT FOREARM  Final   Special Requests   Final    BOTTLES DRAWN AEROBIC AND ANAEROBIC Blood Culture adequate volume   Culture   Final    NO GROWTH 5 DAYS Performed at [facility], [place] [address], [place], [place] [address]    Report Status 02/08/2019 FINAL  Final  Surgical pcr screen     Status: Abnormal   Collection Time: 02/07/19  8:40 AM   Specimen: Nasal Mucosa; Nasal Swab  Result Value Ref Range Status   MRSA, PCR POSITIVE (A) NEGATIVE Final    Comment: RESULT CALLED TO, READ BACK BY AND VERIFIED WITH: Arther Dames RN 10:50 02/07/19 ([email or web address])    Staphylococcus aureus POSITIVE (A) NEGATIVE Final    Comment: (NOTE) The Xpert SA Assay (FDA approved for NASAL specimens in patients 72 years of age and older), is one component of a comprehensive surveillance program. It is not intended to diagnose infection nor to guide or monitor treatment. Performed at [facility], [place] [address] [place], [place] [address]      Labs: Basic Metabolic Panel: Recent Labs  Lab 02/05/19 0206 02/06/19 0314 02/07/19 0225 02/08/19 0228 02/09/19 0213  NA 142 139 139 140 138  K 4.2 3.9 4.2 3.3* 3.9  CL 112* 108 108 108 107  CO2 20* 20* 20* 23 24  GLUCOSE 110* 136* 158* 151* 163*  BUN 28* 25* 30* 29* 31*  CREATININE 1.17* 1.25* 1.27* 1.32* 1.35*  CALCIUM 8.1* 8.3* 8.2* 8.2* 8.4*  MG  --  1.9 1.8 1.6* 2.0   Liver Function Tests: Recent Labs  Lab 02/02/19 1753 02/03/19 0754 02/04/19 0201 02/07/19 0225  AST 18 16 21  39  ALT 12 12 12 15   ALKPHOS 61 48 40 39  BILITOT 0.9  0.7 0.7 0.8  PROT 6.1* 5.5* 5.1* 5.3*  ALBUMIN 3.5 3.0* 2.7* 3.0*   Recent Labs  Lab 02/02/19 1753  LIPASE 34   No results for input(s): AMMONIA in the last 168 hours. CBC: Recent Labs  Lab 02/04/19 0201 02/05/19 0206 02/06/19 0314 02/07/19 0225 02/08/19 0228 02/09/19 0213  WBC 23.2* 21.7* 21.6* 26.8* 24.0* 24.8*  NEUTROABS 5.1  --   --   --  3.5 3.3  HGB 9.4* 9.1* 9.7* 9.4* 8.8* 9.1*  HCT 29.4* 27.9* 30.2* 29.5* 26.7* 28.0*  MCV 100.3* 98.9 100.7* 101.4* 97.8 99.3  PLT 116* 121* 139* 138* 124* 136*   Cardiac Enzymes: No results for input(s): CKTOTAL, CKMB, CKMBINDEX, TROPONINI in the last 168 hours. BNP: BNP (last 3 results) Recent Labs    06/11/18 0539  BNP 3,675.2*    ProBNP (last 3 results) No results for input(s): PROBNP in the last 8760 hours.  CBG: No results for input(s): GLUCAP in the last 168 hours.     Signed:  Cristal Ford  [facility] 02/09/2019, 12:06 PM

## 2019-02-09 NOTE — Progress Notes (Signed)
Hydrologist [facility])  Hospital Liaison: RN note   Notified by Ignacia Bayley of patient/family request for [facility] services at home after discharge. Chart and patient information under review by [facility] physician. Hospice eligibility pending currently.     Writer spoke with daughter Vaughan Basta to initiate education related to hospice philosophy, services and team approach to care.Vaughan Basta  verbalized understanding of information given. Per discussion, plan is for discharge to home by [facility] when DME has delivered.    Please send signed and completed DNR form home with patient/family. Patient will need prescriptions for discharge comfort medications.     DME needs have been discussed, patient currently has the following equipment in the home:  [place], [place].  Patient/family requests the following DME for delivery to the home: Hospital bed and Oxygen . [facility] equipment manager has been notified and will contact [employer] to arrange delivery to the home. Home address has been verified and is correct in the chart.                 Vaughan Basta is the family member to contact to arrange time of delivery.     [facility] aware of the above. Please notify [facility] when patient is ready to leave the unit at discharge. (Call [phone] or [phone] after 5pm.) [facility] information and contact numbers given to  [facility].      Please call with any hospice related questions.     Thank you for this referral.     Farrel Gordon, RN, CCM  [facility] (listed on [place] under Hospice and [place])  [phone]  ?

## 2019-02-09 NOTE — Discharge Instructions (Signed)
Hospice °Hospice is a service that is designed to provide people who are terminally ill and their families with medical, spiritual, and psychological support. Its aim is to improve your quality of life by keeping you as comfortable as possible in the final stages of life. °Who will be my providers when I begin hospice care? °Hospice teams often include: °· A nurse. °· A doctor. The hospice doctor will be available for your care, but you can include your regular doctor or nurse practitioner. °· A social worker. °· A counselor. °· A religious leader (such as a chaplain). °· A dietitian. °· Therapists. °· Trained volunteers who can help with care. °What services does hospice provide? °Hospice services can vary depending on the center or organization. Generally, they include: °· Ways to keep you comfortable, such as: °? Providing care in your home or in a home-like setting. °? Working with your family and friends to help meet your needs. °? Allowing you to enjoy the support of loved ones by receiving much of your basic care from family and friends. °· Pain relief and symptom management. The staff will supply all necessary medicines and equipment so that you can stay comfortable and alert enough to enjoy the company of your friends and family. °· Visits or care from a nurse and doctor. This may include 24-hour on-call services. °· Companionship when you are alone. °· Allowing you and your family to rest. Hospice staff may do light housekeeping, prepare meals, and run errands. °· Counseling. They will make sure your emotional, spiritual, and social needs are being met, as well as those needs of your family members. °· Spiritual care. This will be individualized to meet your needs and your family's needs. It may involve: °? Helping you and your family understand the dying process. °? Helping you say goodbye to your family and friends. °? Performing a specific religious ceremony or ritual. °· Massage. °· Nutrition  therapy. °· Physical and occupational therapy. °· Short-term inpatient care, if something cannot be managed in the home. °· Art or music therapy. °· Bereavement support for grieving family members. °When should hospice care begin? °Most people who use hospice are believed to have less than 6 months to live. °· Your family and health care providers can help you decide when hospice services should begin. °· If you live longer than 6 months but your condition does not improve, your doctor may be able to approve you for continued hospice care. °· If your condition improves, you may discontinue the program. °What should I consider before selecting a program? °Most hospice programs are run by nonprofit, independent organizations. Some are affiliated with hospitals, nursing homes, or home health care agencies. Hospice programs can take place in your home or at a hospice center, hospital, or skilled nursing facility. When choosing a hospice program, ask the following questions: °· What services are available to me? °· What services will be offered to my loved ones? °· How involved will my loved ones be? °· How involved will my health care provider be? °· Who makes up the hospice care team? How are they trained or screened? °· How will my pain and symptoms be managed? °· If my circumstances change, can the services be provided in a different setting, such as my home or in the hospital? °· Is the program reviewed and licensed by the state or certified in some other way? °· What does it cost? Is it covered by insurance? °· If I choose a hospice   center or nursing home, where is the hospice center located? Is it convenient for family and friends? °· If I choose a hospice center or nursing home, can my family and friends visit any time? °· Will you provide emotional and spiritual support? °· Who can my family call with questions? °Where can I learn more about hospice? °You can learn about existing hospice programs in your area  from your health care providers. You can also read more about hospice online. The websites of the following organizations have helpful information: °· National Hospice and Palliative Care Organization (NHPCO): www.nhpco.org °· National Association for Home Care & Hospice (NAHC): www.nahc.org °· Hospice Foundation of America (HFA): www.hospicefoundation.org °· American Cancer Society (ACS): www.cancer.org °· Hospice Net: www.hospicenet.org °· Visiting Nurse Associations of America (VNAA): www.vnaa.org °You may also find more information by contacting the following agencies: °· A local agency on aging. °· Your local United Way chapter. °· Your state's department of health or social services. °Summary °· Hospice is a service that is designed to provide people who are terminally ill and their families with medical, spiritual, and psychological support. °· Hospice aims to improve your quality of life by keeping you as comfortable as possible in the final stages of life. °· Hospice teams often include a doctor, nurse, social worker, counselor, religious leader,dietitian, therapists, and volunteers. °· Hospice care generally includes medicine for symptom management, visits from doctors and nurses, physical and occupational therapy, nutrition counseling, spiritual and emotional counseling, caregiver support, and bereavement support for grieving family members. °· Hospice programs can take place in your home or at a hospice center, hospital, or skilled nursing facility. °This information is not intended to replace advice given to you by your health care provider. Make sure you discuss any questions you have with your health care provider. °Document Released: 07/04/2003 Document Revised: 02/27/2017 Document Reviewed: 04/08/2016 °Elsevier Patient Education © 2020 Elsevier Inc. ° °

## 2019-02-09 NOTE — TOC Transition Note (Signed)
Transition of Care [facility]) - CM/SW Discharge Note   Patient Details  Name: GEONNA DICKEL MRN: [id number] Date of Birth: 06-25-1922  [facility]) CM/SW Contact:  Sharin Mons, RN Phone Number: 02/09/2019, 2:52 PM   Clinical Narrative:    Patient will DC to: Home  Anticipated DC date: 02/09/2019 Family notified: daughter, Cheral Marker by: Corey Harold   Per MD patient ready for DC to home with hospice care . RN, patient, and  patient's family, aware of DC. Discharge Summary completed.  DC packet on chart. Ambulance transport will be  requested for patient once DME ( oxygen, hospital bed) has been delivered to pt's home. Daughter to alert nurse once equipment delivered. Transportation services will then be arranged with [facility] by nurse   RNCM will sign off for now as intervention is no longer needed. Please consult [place] again if new needs arise.   Final next level of care: Home w Hospice Care Barriers to Discharge: No Barriers Identified   Patient Goals and CMS Choice        Discharge Placement                       Discharge Plan and Services                DME Arranged: (cHOICE hOME mEDICAL)                    Social Determinants of Health (SDOH) Interventions     Readmission Risk Interventions No flowsheet data found.

## 2019-02-09 NOTE — Progress Notes (Signed)
Patient discharged home via [facility] transport.  Daughter aware of discharge and expecting patient at home.  Patient discharged with discharge packet and belongings.  IV removed.  V/S stable.

## 2019-02-09 NOTE — Consult Note (Signed)
Consultation Note Date: 02/09/2019   Patient Name: Mary Cline  DOB: [birth year]  MRN: [id number]  Age / Sex: 85 y.o., female  PCP: Rankins, Bill Salinas, MD Referring Physician: Cristal Ford, DO  Reason for Consultation: Establishing goals of care and Psychosocial/spiritual support  HPI/Patient Profile: 85 y.o. female  admitted on 02/02/2019 with past  medical history significant for chronic systolic CHF with EF 20 to 25%, hypertension, and chronic kidney disease stage III, CLL now presenting to the emergency department with 1 day of lower abdominal pain, nausea, and bloody diarrhea.    Patient is accompanied by her daughter who assists with the history.  She reportedly woke in her usual state of health but shortly after breakfast developed severe nausea.  She went on to have diarrhea and was complaining of pain across her lower abdomen.  The third episode of diarrhea contained bright red blood per the daughter's report and the fourth episode was mainly bright red blood.    ED Course: Upon arrival to the ED, patient is found to be afebrile, saturating well on room air, and with stable blood pressure.  EKG features sinus rhythm with chronic LBBB.  Chemistry panel is notable for creatinine 1.24, with baseline apparently 1.1.  CBC features a leukocytosis to 30,600 and a mild normocytic anemia.  CT the abdomen and pelvis reveals sigmoid thickening concerning for diverticulitis.  Blood and urine cultures were collected, stool studies ordered, 0.5 L normal saline administered, and the patient was started on Rocephin and Flagyl.  Today is day 6 of this hospitalization.  Per patient and her family she has had continued slow physical, and functional decline over the last 2 years.  Family discussion has been around the fact that the focus of care is comfort and dignity.  Patient lives at home with her 2 daughters and  son-in-law.  She has good family support.  Patient faces treatment option decisions, advanced directive decisions and anticipatory care needs.    Clinical Assessment and Goals of Care:  This NP Wadie Lessen reviewed medical records, received report from team, assessed the patient and then meet at the patient's bedside along with daughter /Mary Cline  to discuss diagnosis, prognosis, GOC, EOL wishes disposition and options.  Concept of Hospice and Palliative Care were discussed  A detailed discussion was had today regarding advanced directives.  Concepts specific to code status, artifical feeding and hydration, continued IV antibiotics and rehospitalization was had.  The difference between a aggressive medical intervention path  and a palliative comfort care path for this patient at this time was had.  Values and goals of care important to patient and family were attempted to be elicited.  MOST form introduced and Hard Choices booklet left for review  Natural trajectory and expectations at EOL were discussed.  Questions and concerns addressed.   Family encouraged to call with questions or concerns.    PMT will continue to support holistically.     SUMMARY OF RECOMMENDATIONS    Code Status/Advance Care Planning:  DNR  Palliative Prophylaxis:   Aspiration, Bowel Regimen, Delirium Protocol, Frequent Pain Assessment and Oral Care  Additional Recommendations (Limitations, Scope, Preferences):  Full Comfort Care   Patient is able to verbalize her desire to transition home, be with her family and forego aggressive life prolonging measures in order to focus on comfort and dignity.  Psycho-social/Spiritual:   Desire for further Chaplaincy support:no  Additional Recommendations: Education on Hospice   Created space and opportunity for patietn and family to explore thoughts and feelings regarding current situation.  Patient is able to verbalize her feelings and frustration with losing her  independence and feeling "like a burden to her family". We discussed the significance of family presence  and role reversals at various times in life.   Prognosis:   < 6 months  Discharge Planning: Home with Hospice      Primary Diagnoses: Present on Admission: . Diverticulitis of colon with bleeding . Chronic systolic heart failure ([place]) . CKD (chronic kidney disease), stage III   I have reviewed the medical record, interviewed the patient and family, and examined the patient. The following aspects are pertinent.  Past Medical History:  Diagnosis Date  . Anemia   . Arthritis   . Chronic systolic heart failure ([place]) 04/03/2015  . Dementia ([place])   . Diabetes mellitus   . Essential hypertension 04/03/2015  . GERD (gastroesophageal reflux disease)   . Hernia   . Hyperlipidemia 04/03/2015  . Hypertension   . Macular degeneration disease    Social History   Socioeconomic History  . Marital status: Widowed    Spouse name: Not on file  . Number of children: 4  . Years of education: Therapist, sports   . Highest education level: Not on file  Occupational History  . Not on file  Social Needs  . Financial resource strain: Not on file  . Food insecurity    Worry: Not on file    Inability: Not on file  . Transportation needs    Medical: Not on file    Non-medical: Not on file  Tobacco Use  . Smoking status: Never Smoker  . Smokeless tobacco: Never Used  Substance and Sexual Activity  . Alcohol use: No  . Drug use: No  . Sexual activity: Not on file  Lifestyle  . Physical activity    Days per week: Not on file    Minutes per session: Not on file  . Stress: Not on file  Relationships  . Social Herbalist on phone: Not on file    Gets together: Not on file    Attends religious service: Not on file    Active member of club or organization: Not on file    Attends meetings of clubs or organizations: Not on file    Relationship status: Not on file  Other Topics Concern  .  Not on file  Social History Narrative   Patient lives at home with her daughter.    Patient is retired.    Patient is a Education administrator.    Patient was an RN    Patient has 4 children.             Epworth Sleepiness Scale = 10 (as of 04/03/2015)   Family History  Problem Relation Age of Onset  . Cancer Sister        Breast cancer  . Diabetes Brother   . Diabetes Maternal Grandmother   . Heart Problems Maternal Grandfather   . Heart Problems Paternal Grandmother   .  Heart Problems Paternal Grandfather   . Diabetes Brother   . Diabetes Brother   . Heart Problems Daughter   . Heart Problems Daughter    Scheduled Meds: . carvedilol  6.25 mg Oral BID  . cefdinir  300 mg Oral Daily  . Chlorhexidine Gluconate Cloth  6 each Topical Q0600  . metroNIDAZOLE  500 mg Oral Q8H  . mupirocin ointment  1 application Nasal BID  . sodium chloride flush  3 mL Intravenous Q12H  . sodium chloride flush  3 mL Intravenous Q12H   Continuous Infusions: . sodium chloride 5 mL/hr at 02/07/19 0000   PRN Meds:.sodium chloride, acetaminophen **OR** acetaminophen, diphenhydrAMINE, fentaNYL (SUBLIMAZE) injection, HYDROcodone-acetaminophen, ondansetron **OR** ondansetron (ZOFRAN) IV, phenol, sodium chloride flush Medications Prior to Admission:  Prior to Admission medications   Medication Sig Start Date End Date Taking? Authorizing Provider  aspirin 81 MG tablet Take 81 mg by mouth daily.   Yes [provider]  carvedilol (COREG) 6.25 MG tablet TAKE 1 TABLET TWICE DAILY Patient taking differently: Take 6.25 mg by mouth 2 (two) times daily.  11/04/18  Yes Skeet Latch, MD  cholecalciferol (VITAMIN D3) 25 MCG (1000 UT) tablet Take 1,000 Units by mouth daily.   Yes [provider]  Cyanocobalamin (VITAMIN B 12) 500 MCG TABS Take 500 mcg by mouth every Monday, Wednesday, and Friday.    Yes [provider]  ezetimibe (ZETIA) 10 MG tablet Take 10 mg by mouth daily.   Yes [provider]  furosemide (LASIX) 20 MG tablet Take 1 tablet (20 mg total) by mouth as needed. For weight gain more that 2 pounds in a day or 5 pounds in a week. 10/09/16  Yes Skeet Latch, MD  glycerin adult 2 G SUPP Place 1 suppository rectally once as needed for moderate constipation.   Yes [provider]  irbesartan (AVAPRO) 150 MG tablet Take 1 tablet (150 mg total) daily by mouth. 02/16/17  Yes Skeet Latch, MD  mirtazapine (REMERON) 15 MG tablet Take 15 mg by mouth at bedtime.  08/24/18  Yes [provider]  Multiple Vitamin (MULTIVITAMIN) tablet Take 1 tablet by mouth daily.   Yes [provider]  omeprazole (PRILOSEC) 40 MG capsule Take 40 mg by mouth daily.   Yes [provider]  ondansetron (ZOFRAN) 4 MG tablet Take 1 tablet (4 mg total) by mouth every 8 (eight) hours as needed for nausea or vomiting. 06/10/18  Yes Hayden Rasmussen, MD  spironolactone (ALDACTONE) 25 MG tablet Take 0.5 tablets (12.5 mg total) by mouth daily. *NEEDS OFFICE VISIT* 11/30/18  Yes Skeet Latch, MD  traMADol (ULTRAM) 50 MG tablet Take 1 tablet (50 mg total) by mouth 3 (three) times daily as needed for severe pain. 09/04/14  Yes Carmin Muskrat, MD   Allergies  Allergen Reactions  . Fish Allergy Anaphylaxis  . Iodine Anaphylaxis  . Actonel [Risedronate Sodium] Nausea And Vomiting  . Atorvastatin Other (See Comments)    Blisters in mouth.   Blisters in mouth.    . Ciprofloxacin Nausea And Vomiting  . Codeine Other (See Comments)    Headache  Headache  . Corticotropin Other (See Comments)    Confusion  Confusion  . Demeclocycline     Stomach pain  . Dihydrotachysterol     Dizzy confused  . Garamycin [Gentamicin Sulfate] Rash  . Lactose Intolerance (Gi) Other (See Comments)    Intolerance.    . Lactulose     Intolerance.    Marland Kitchen  Lovastatin Other (See Comments)    blisters blisters  . Macrobid [Nitrofurantoin Monohyd Macro] Nausea And Vomiting  .  Meperidine Other (See Comments)    Hypotension  Hypotension  . Other Hives and Swelling    Melons and strawberries.    . Paxil [Paroxetine Hcl] Nausea Only  . Penicillins Other (See Comments)    Did it involve swelling of the face/tongue/throat, SOB, or low BP? No Did it involve sudden or severe rash/hives, skin peeling, or any reaction on the inside of your mouth or nose? Unknown Did you need to seek medical attention at a hospital or doctor's office? Unknown When did it last happen?Childhood If all above answers are "NO", may proceed with cephalosporin use. Pain.    . Rofecoxib Other (See Comments)    Not known Not known  . Sulfa Antibiotics Swelling  . Tetracyclines & Related Other (See Comments)    Stomach pain  . Vitamin D Analogs Other (See Comments)    Dizzy confused   Review of Systems  Constitutional: Positive for appetite change.  Neurological: Positive for weakness.    Physical Exam Constitutional:      Appearance: She is cachectic. She is ill-appearing.  Cardiovascular:     Rate and Rhythm: Normal rate.  Pulmonary:     Effort: Pulmonary effort is normal.  Musculoskeletal:     Comments: -Generalized weakness and muscle atrophy  Skin:    General: Skin is warm and dry.  Neurological:     Mental Status: She is alert.     Vital Signs: BP 114/72 (BP Location: Left Arm)   Pulse 91   Temp 98.9 F (37.2 C) (Oral)   Resp 16   Ht 4\' 9"  (1.448 m)   Wt 55.1 kg   SpO2 97%   BMI 26.29 kg/m  Pain Scale: 0-10 POSS *See Group Information*: S-Acceptable,Sleep, easy to arouse Pain Score: Asleep   SpO2: SpO2: 97 % O2 Device:SpO2: 97 % O2 Flow Rate: .O2 Flow Rate (L/min): 2 L/min  IO: Intake/output summary:   Intake/Output Summary (Last 24 hours) at 02/09/2019 1119 Last data filed at 02/08/2019 1258 Gross per 24 hour  Intake -  Output 540 ml  Net -540 ml    LBM: Last BM Date: 02/06/19 Baseline Weight: Weight: 48.7 kg Most recent weight: Weight:  55.1 kg     Palliative Assessment/Data:   Discussed with Dr Ree Kida  Time In: 1015 Time Out: 1130 Time Total: 75 minutes Greater than 50%  of this time was spent counseling and coordinating care related to the above assessment and plan.  Signed by: Wadie Lessen, NP   Please contact Palliative Medicine Team phone at [phone] for questions and concerns.  For individual provider: See Shea Evans

## 2019-02-14 DIAGNOSIS — K5732 Diverticulitis of large intestine without perforation or abscess without bleeding: Secondary | ICD-10-CM | POA: Diagnosis not present

## 2019-02-14 DIAGNOSIS — R5381 Other malaise: Secondary | ICD-10-CM | POA: Diagnosis not present

## 2019-02-14 DIAGNOSIS — E785 Hyperlipidemia, unspecified: Secondary | ICD-10-CM | POA: Diagnosis not present

## 2019-02-14 DIAGNOSIS — E1140 Type 2 diabetes mellitus with diabetic neuropathy, unspecified: Secondary | ICD-10-CM | POA: Diagnosis not present

## 2019-02-14 DIAGNOSIS — I10 Essential (primary) hypertension: Secondary | ICD-10-CM | POA: Diagnosis not present

## 2019-02-14 DIAGNOSIS — I509 Heart failure, unspecified: Secondary | ICD-10-CM | POA: Diagnosis not present

## 2019-02-14 DIAGNOSIS — R634 Abnormal weight loss: Secondary | ICD-10-CM | POA: Diagnosis not present

## 2019-02-15 ENCOUNTER — Ambulatory Visit: Payer: Medicare PPO | Admitting: Sports Medicine

## 2019-04-12 ENCOUNTER — Other Ambulatory Visit: Payer: Medicare PPO

## 2019-04-12 ENCOUNTER — Ambulatory Visit: Payer: Medicare PPO | Admitting: Hematology

## 2019-05-02 DEATH — deceased

## 2020-08-10 IMAGING — US US ABDOMEN LIMITED
1 series · 14 of 25 positions shown · non-contrast
Comparison: None.

CLINICAL DATA: Abdominal pain

EXAM:
ULTRASOUND ABDOMEN LIMITED RIGHT UPPER QUADRANT

[Series 1: us abdomen limited · 33 acquisitions, 14 frames shown]
[im 1/33]
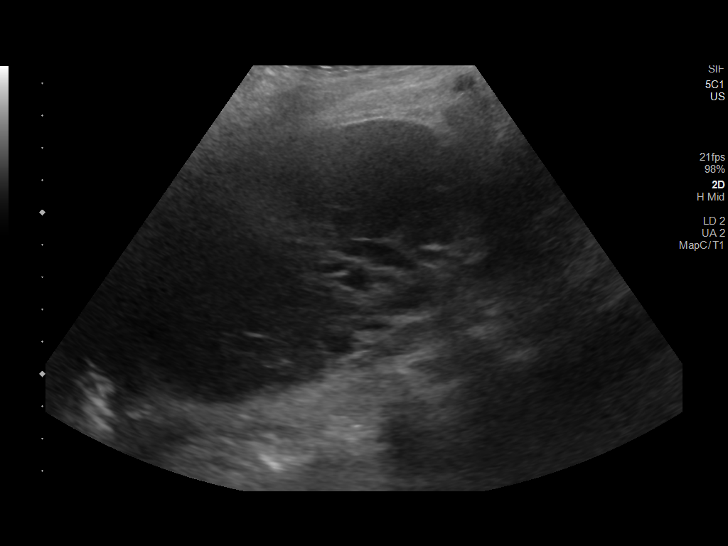
[im 3/33]
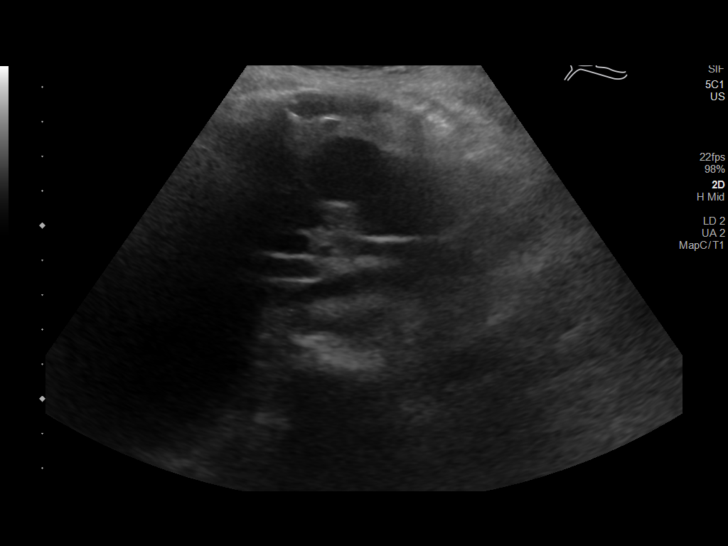
[im 6/33]
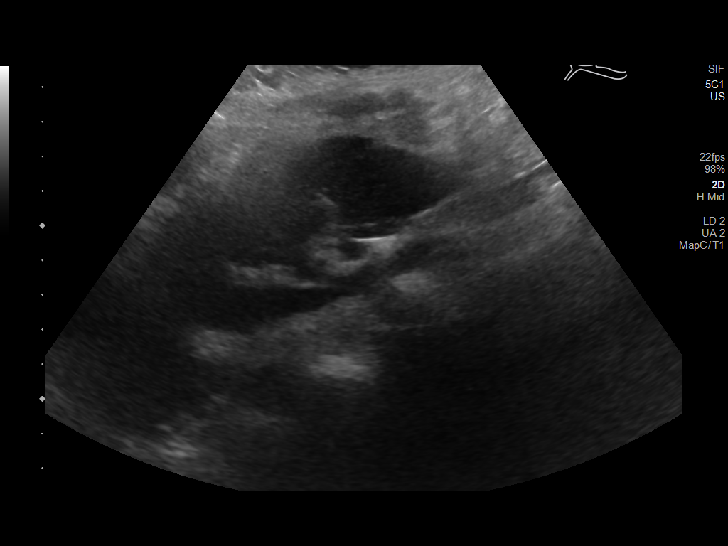
[im 9/33]
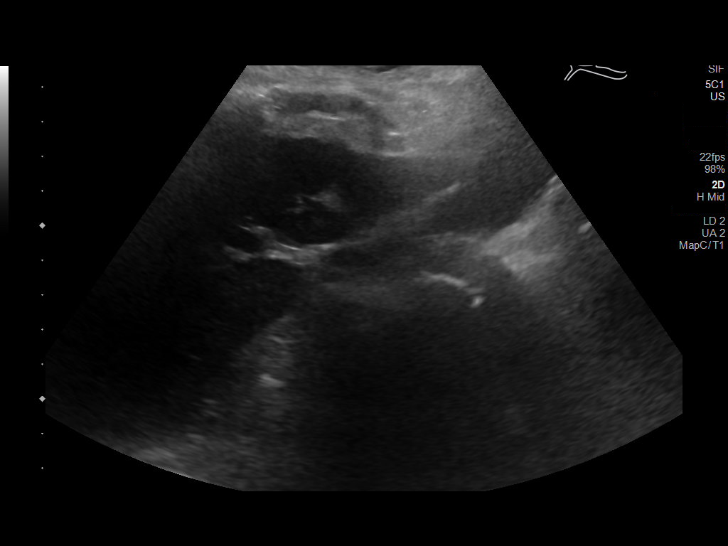
[im 11/33]
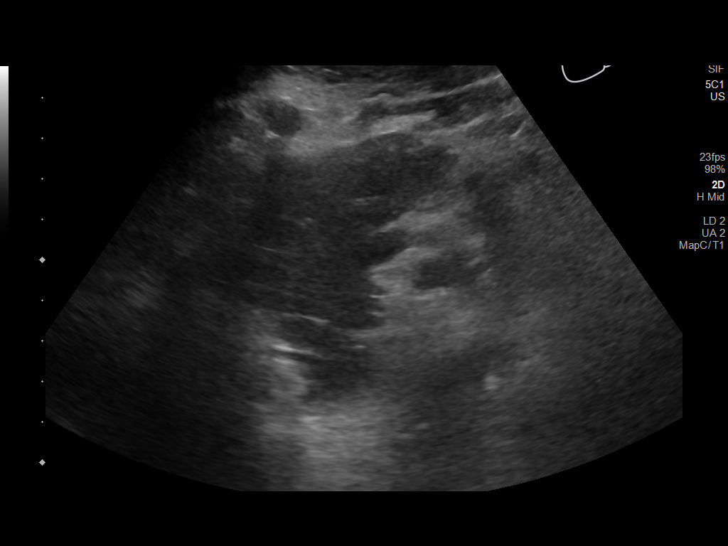
[im 13/33]
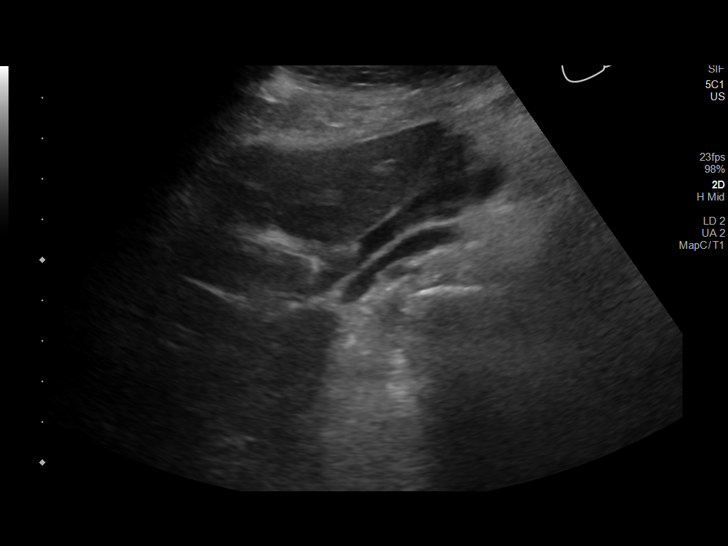
[im 15/33]
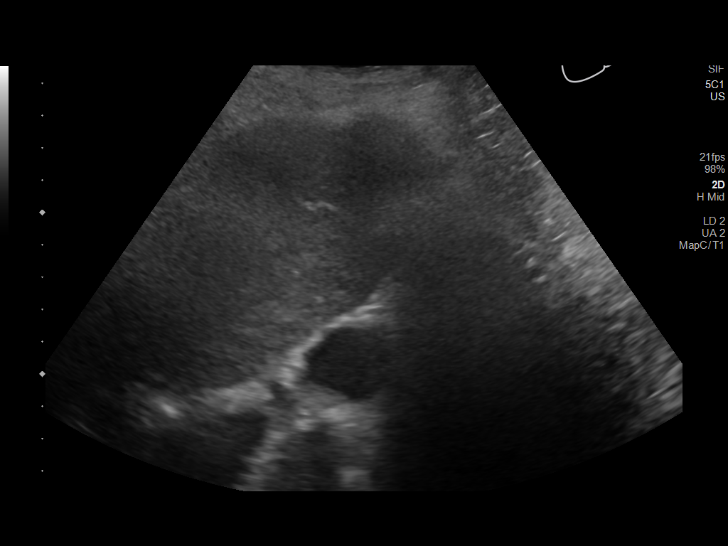
[im 18/33]
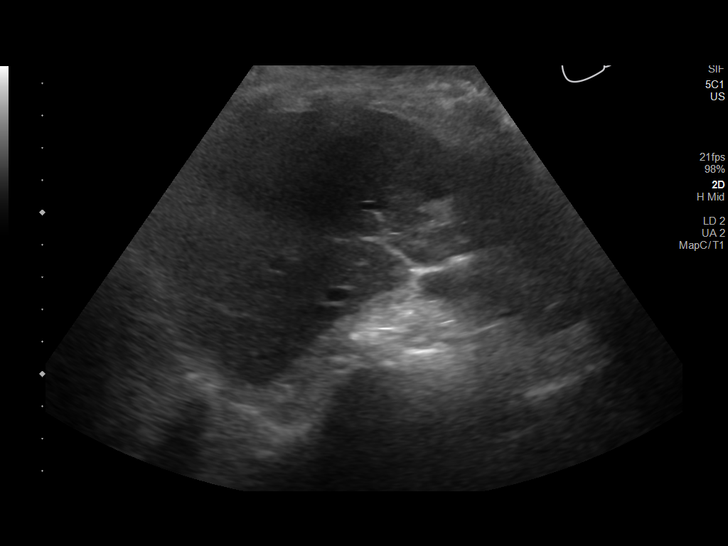
[im 21/33]
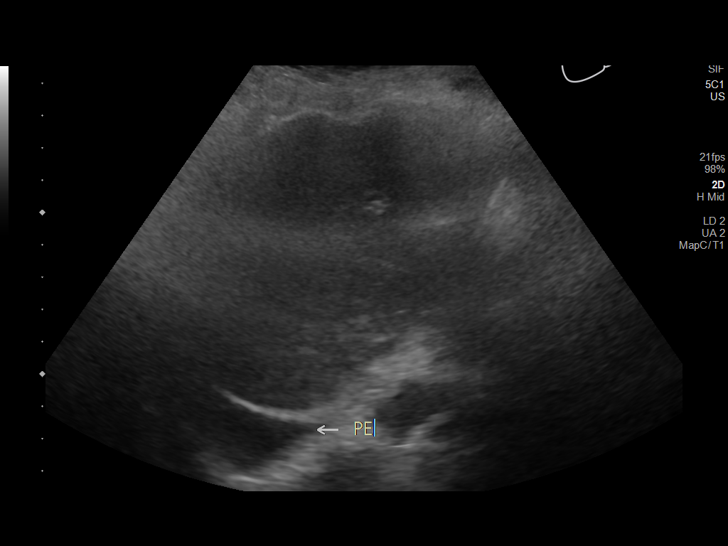
[im 22/33]
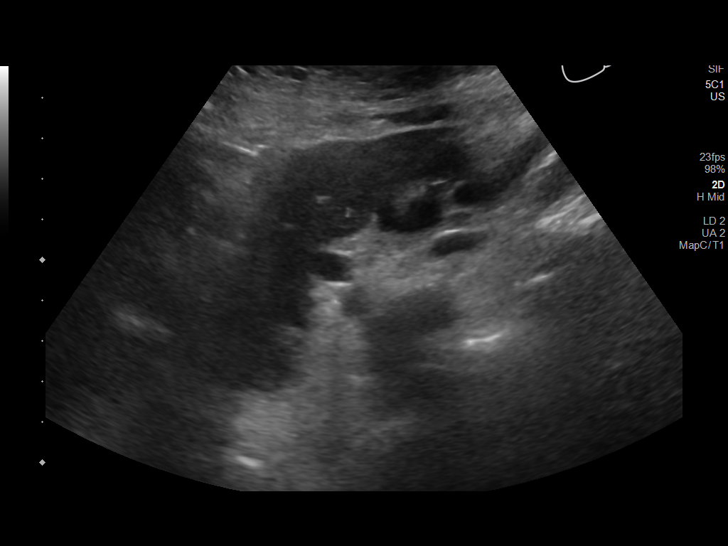
[im 25/33]
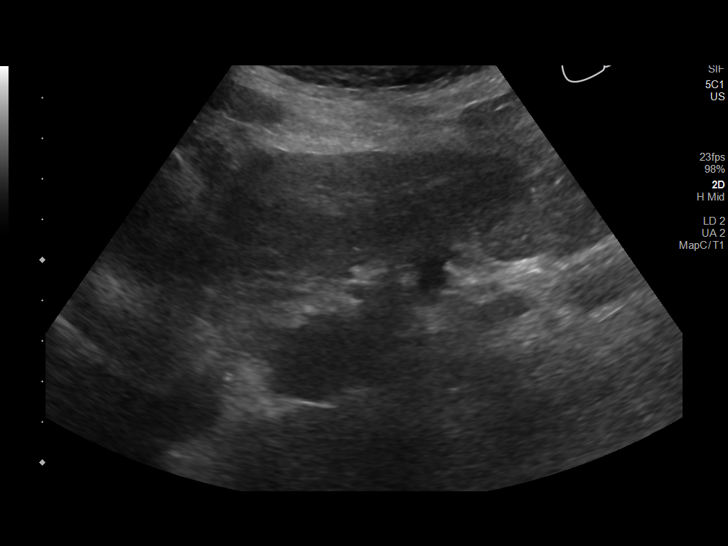
[im 27/33]
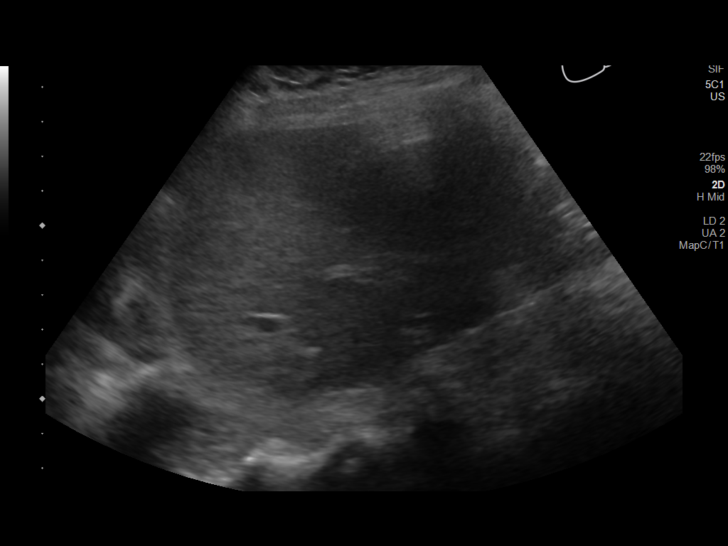
[im 30/33]
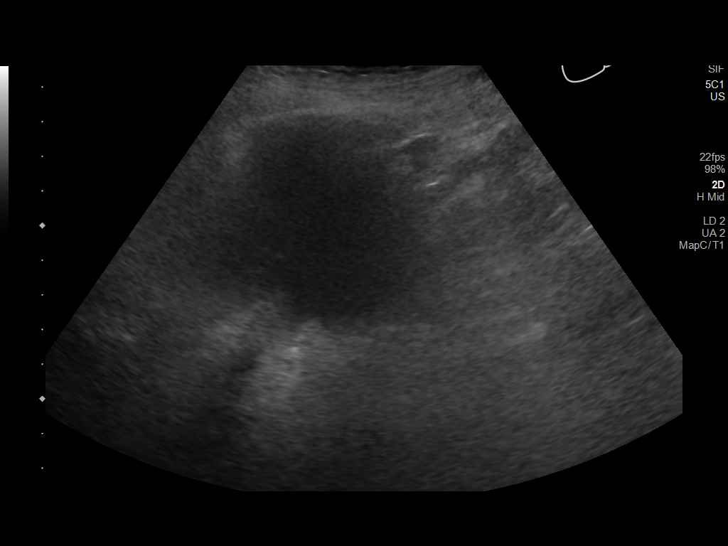
[im 33/33]
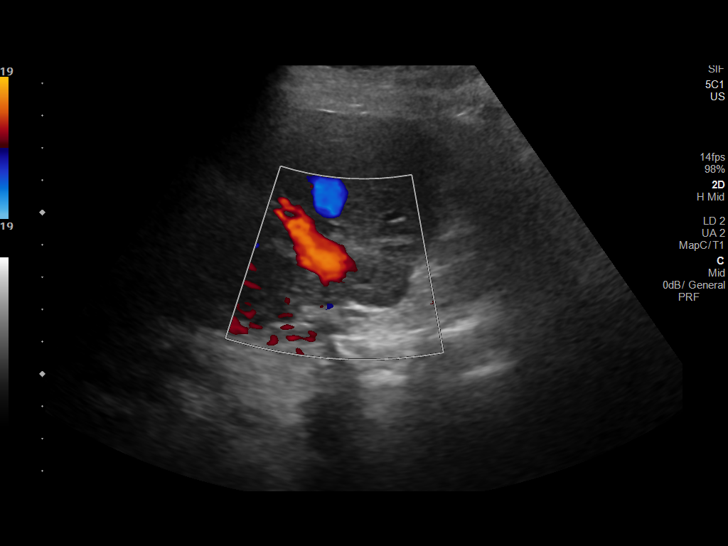

[14 of 25 positions shown; findings below may reference images not displayed]

FINDINGS: Gallbladder:

Postcholecystectomy

Common bile duct:

Diameter: Normal at 5 mm

Liver:

No focal lesion identified. Within normal limits in parenchymal
echogenicity. Portal vein is patent on color Doppler imaging with
normal direction of blood flow towards the liver.

Other: None.
IMPRESSION: Normal RIGHT upper quadrant ultrasound following cholecystectomy.

## 2020-08-11 IMAGING — DX DG CHEST 1V PORT
1 series · 1 of 1 positions shown · non-contrast
Comparison: 02/06/2019

CLINICAL DATA: Shortness of breath

EXAM:
PORTABLE CHEST 1 VIEW

[chest ap]
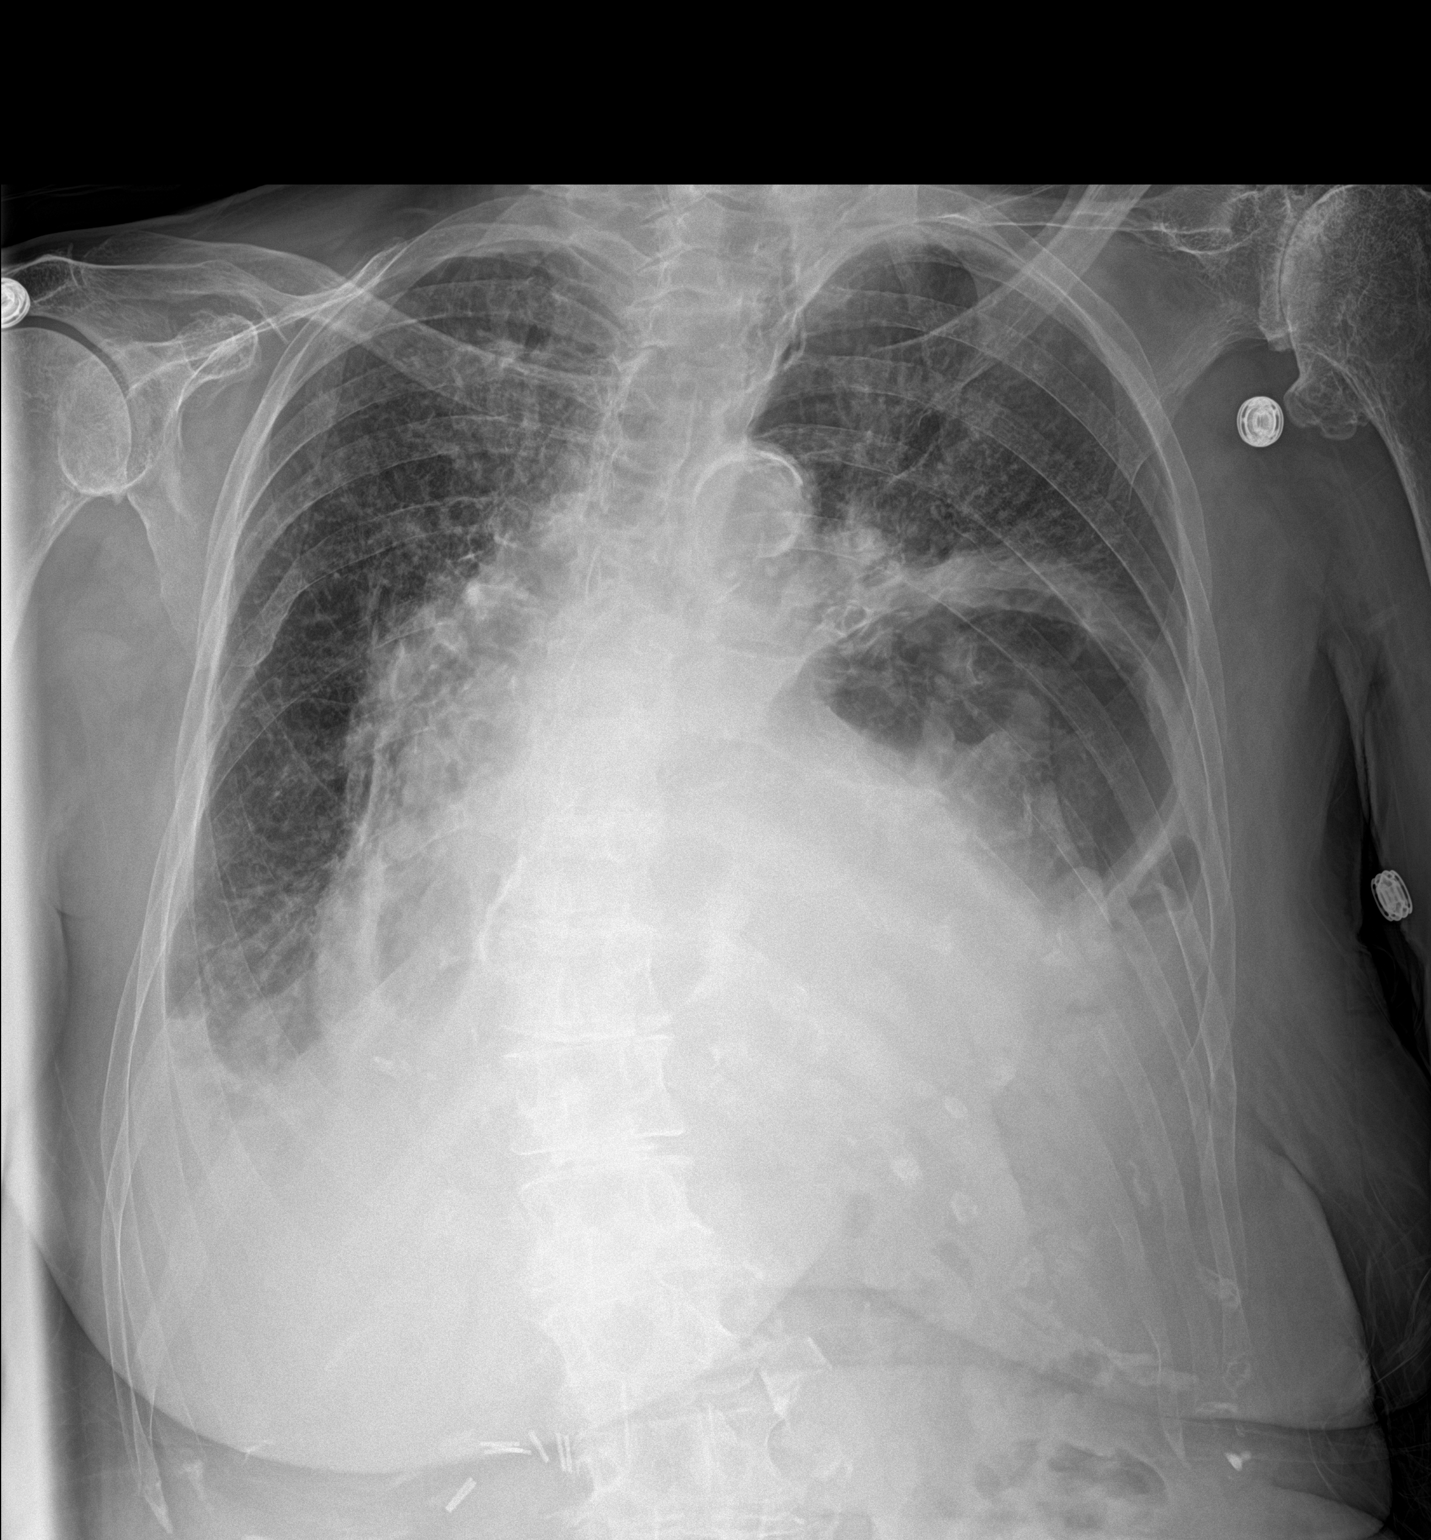

[1 of 1 positions shown; findings below may reference images not displayed]

FINDINGS: Pulmonary vascular congestion. Left lower lobe and right lower lobe
atelectasis. Small right pleural effusion.

Cardiomegaly.  Thoracic aortic atherosclerosis.

Large hiatal hernia at the left lung base.
IMPRESSION: Pulmonary vascular congestion.  Small right pleural effusion.

Bilateral lower lobe atelectasis.

Large hiatal hernia.
